# Patient Record
Sex: Male | Born: 1945 | Hispanic: No | Smoking: Never smoker
Health system: Southern US, Community
[De-identification: ages and names within clinical notes are randomized; demographics above are authoritative.]

## PROBLEM LIST (undated history)

## (undated) DIAGNOSIS — J9601 Acute respiratory failure with hypoxia: Secondary | ICD-10-CM

## (undated) DIAGNOSIS — R41841 Cognitive communication deficit: Secondary | ICD-10-CM

## (undated) DIAGNOSIS — M109 Gout, unspecified: Secondary | ICD-10-CM

## (undated) DIAGNOSIS — G9341 Metabolic encephalopathy: Secondary | ICD-10-CM

## (undated) DIAGNOSIS — I69398 Other sequelae of cerebral infarction: Secondary | ICD-10-CM

## (undated) DIAGNOSIS — K5900 Constipation, unspecified: Secondary | ICD-10-CM

## (undated) DIAGNOSIS — K219 Gastro-esophageal reflux disease without esophagitis: Secondary | ICD-10-CM

## (undated) DIAGNOSIS — R1310 Dysphagia, unspecified: Secondary | ICD-10-CM

## (undated) DIAGNOSIS — I10 Essential (primary) hypertension: Secondary | ICD-10-CM

## (undated) DIAGNOSIS — M6281 Muscle weakness (generalized): Secondary | ICD-10-CM

## (undated) DIAGNOSIS — Z8616 Personal history of COVID-19: Secondary | ICD-10-CM

## (undated) HISTORY — DX: Constipation, unspecified: K59.00

## (undated) HISTORY — DX: Dysphagia, unspecified: R13.10

## (undated) HISTORY — DX: Personal history of COVID-19: Z86.16

## (undated) HISTORY — DX: Muscle weakness (generalized): M62.81

## (undated) HISTORY — DX: Cognitive communication deficit: R41.841

## (undated) HISTORY — DX: Other sequelae of cerebral infarction: I69.398

## (undated) HISTORY — DX: Acute respiratory failure with hypoxia: J96.01

## (undated) HISTORY — DX: Metabolic encephalopathy: G93.41

---

## 1999-05-19 ENCOUNTER — Inpatient Hospital Stay (HOSPITAL_COMMUNITY): Admission: EM | Admit: 1999-05-19 | Discharge: 1999-05-22 | Payer: Self-pay | Admitting: Emergency Medicine

## 1999-05-19 ENCOUNTER — Encounter: Payer: Self-pay | Admitting: Emergency Medicine

## 2000-01-17 ENCOUNTER — Emergency Department (HOSPITAL_COMMUNITY): Admission: EM | Admit: 2000-01-17 | Discharge: 2000-01-17 | Payer: Self-pay | Admitting: Emergency Medicine

## 2007-01-19 ENCOUNTER — Ambulatory Visit (HOSPITAL_COMMUNITY): Admission: RE | Admit: 2007-01-19 | Discharge: 2007-01-19 | Payer: Self-pay | Admitting: Orthopedic Surgery

## 2008-12-06 ENCOUNTER — Emergency Department (HOSPITAL_COMMUNITY): Admission: EM | Admit: 2008-12-06 | Discharge: 2008-12-06 | Payer: Self-pay | Admitting: Emergency Medicine

## 2012-03-17 DIAGNOSIS — I10 Essential (primary) hypertension: Secondary | ICD-10-CM | POA: Insufficient documentation

## 2013-01-15 ENCOUNTER — Emergency Department (HOSPITAL_COMMUNITY): Payer: Medicare Other

## 2013-01-15 ENCOUNTER — Encounter (HOSPITAL_COMMUNITY): Payer: Self-pay | Admitting: *Deleted

## 2013-01-15 ENCOUNTER — Emergency Department (HOSPITAL_COMMUNITY)
Admission: EM | Admit: 2013-01-15 | Discharge: 2013-01-15 | Disposition: A | Discharge status: Home / Self Care | Payer: Medicare Other | Attending: Emergency Medicine | Admitting: Emergency Medicine

## 2013-01-15 DIAGNOSIS — K922 Gastrointestinal hemorrhage, unspecified: Secondary | ICD-10-CM | POA: Insufficient documentation

## 2013-01-15 DIAGNOSIS — R0602 Shortness of breath: Secondary | ICD-10-CM | POA: Insufficient documentation

## 2013-01-15 DIAGNOSIS — Z8719 Personal history of other diseases of the digestive system: Secondary | ICD-10-CM | POA: Insufficient documentation

## 2013-01-15 DIAGNOSIS — I10 Essential (primary) hypertension: Secondary | ICD-10-CM | POA: Insufficient documentation

## 2013-01-15 DIAGNOSIS — R42 Dizziness and giddiness: Secondary | ICD-10-CM | POA: Insufficient documentation

## 2013-01-15 DIAGNOSIS — K921 Melena: Secondary | ICD-10-CM

## 2013-01-15 DIAGNOSIS — Z79899 Other long term (current) drug therapy: Secondary | ICD-10-CM | POA: Insufficient documentation

## 2013-01-15 HISTORY — DX: Essential (primary) hypertension: I10

## 2013-01-15 LAB — COMPREHENSIVE METABOLIC PANEL
ALT: 16 U/L (ref 0–53)
AST: 13 U/L (ref 0–37)
Albumin: 3.3 g/dL — ABNORMAL LOW (ref 3.5–5.2)
Alkaline Phosphatase: 67 U/L (ref 39–117)
BUN: 55 mg/dL — ABNORMAL HIGH (ref 6–23)
CO2: 20 mEq/L (ref 19–32)
Calcium: 9.5 mg/dL (ref 8.4–10.5)
Chloride: 108 mEq/L (ref 96–112)
Creatinine, Ser: 1.49 mg/dL — ABNORMAL HIGH (ref 0.50–1.35)
GFR calc Af Amer: 55 mL/min — ABNORMAL LOW (ref >90)
GFR calc non Af Amer: 48 mL/min — ABNORMAL LOW (ref >90)
Glucose, Bld: 136 mg/dL — ABNORMAL HIGH (ref 70–99)
Potassium: 3.8 mEq/L (ref 3.5–5.1)
Sodium: 142 mEq/L (ref 135–145)
Total Bilirubin: 0.3 mg/dL (ref 0.3–1.2)
Total Protein: 6.8 g/dL (ref 6.0–8.3)

## 2013-01-15 LAB — CBC WITH DIFFERENTIAL/PLATELET
Basophils Absolute: 0.1 10*3/uL (ref 0.0–0.1)
Basophils Relative: 1 % (ref 0–1)
Eosinophils Absolute: 0.1 10*3/uL (ref 0.0–0.7)
Eosinophils Relative: 0 % (ref 0–5)
HCT: 37.4 % — ABNORMAL LOW (ref 39.0–52.0)
Hemoglobin: 13.5 g/dL (ref 13.0–17.0)
Lymphocytes Relative: 21 % (ref 12–46)
Lymphs Abs: 2.9 10*3/uL (ref 0.7–4.0)
MCH: 32.9 pg (ref 26.0–34.0)
MCHC: 36.1 g/dL — ABNORMAL HIGH (ref 30.0–36.0)
MCV: 91.2 fL (ref 78.0–100.0)
Monocytes Absolute: 0.8 10*3/uL (ref 0.1–1.0)
Monocytes Relative: 6 % (ref 3–12)
Neutro Abs: 10.1 10*3/uL — ABNORMAL HIGH (ref 1.7–7.7)
Neutrophils Relative %: 72 % (ref 43–77)
Platelets: 273 10*3/uL (ref 150–400)
RBC: 4.1 MIL/uL — ABNORMAL LOW (ref 4.22–5.81)
RDW: 14.3 % (ref 11.5–15.5)
WBC: 13.9 10*3/uL — ABNORMAL HIGH (ref 4.0–10.5)

## 2013-01-15 LAB — TROPONIN I: Troponin I: <0.3 ng/mL (ref <0.30)

## 2013-01-15 LAB — OCCULT BLOOD, POC DEVICE: Fecal Occult Bld: POSITIVE — AB

## 2013-01-15 MED ORDER — FAMOTIDINE 20 MG PO TABS: 20.0000 mg | ORAL_TABLET | Freq: Two times a day (BID) | ORAL | Status: DC

## 2013-01-15 MED ORDER — PANTOPRAZOLE SODIUM 20 MG PO TBEC: 40.0000 mg | DELAYED_RELEASE_TABLET | Freq: Every day | ORAL | Status: DC

## 2013-01-15 MED ORDER — SODIUM CHLORIDE 0.9 % IV BOLUS (SEPSIS)
1000.0000 mL | Freq: Once | INTRAVENOUS | Status: AC
  Administered 2013-01-15: 1000 mL via INTRAVENOUS

## 2013-01-15 MED ORDER — FAMOTIDINE 20 MG PO TABS
20.0000 mg | ORAL_TABLET | Freq: Once | ORAL | Status: AC
  Administered 2013-01-15: 20 mg via ORAL
  Filled 2013-01-15: qty 1

## 2013-01-15 MED ORDER — PANTOPRAZOLE SODIUM 40 MG PO TBEC
40.0000 mg | DELAYED_RELEASE_TABLET | Freq: Once | ORAL | Status: AC
  Administered 2013-01-15: 40 mg via ORAL
  Filled 2013-01-15: qty 1

## 2013-01-15 MED ORDER — SODIUM CHLORIDE 0.9 % IV BOLUS (SEPSIS): 1000.0000 mL | Freq: Once | INTRAVENOUS | Status: DC

## 2013-01-15 NOTE — ED Notes (Signed)
Pt disoriented at time of discharge. Stating "I don't know how I'm gonna get home. I've been falling a lot." Pt with disheveled appearance. Dr. Silverio Lay aware. New orders received

## 2013-01-15 NOTE — ED Notes (Signed)
Patient refused his ekg notified Dr.Wao of the refusal

## 2013-01-15 NOTE — ED Notes (Signed)
Patient refusing IV and IVF at this time, MD notified

## 2013-01-15 NOTE — ED Provider Notes (Addendum)
History     CSN: [id number]  Arrival date & time 01/15/13  0721   First MD Initiated Contact with Patient 01/15/13 [phone]      Chief Complaint  Patient presents with  . Melena  . Shortness of Breath    (Consider location/radiation/quality/duration/timing/severity/associated sxs/prior treatment) The history is provided by the patient.  Vincent Cummings is a 67 y.o. male hx of GI bleed, HTN here presenting with melena. He ate some spice from [place] 3 days ago and subsequently had black stools. He said that the spice appeared black. He denies abdominal pain, nausea, vomiting, or diarrhea. Does have hx of GI bleed in the past but he is unclear what the source was. No fevers or chills. Felt lightheaded but didn't pass out. No chest pain but he has mild SOB not worse with exertion. No hx of PE/DVTs.    Past Medical History  Diagnosis Date  . Hypertension     History reviewed. No pertinent past surgical history.  No family history on file.  History  Substance Use Topics  . Smoking status: Never Smoker   . Smokeless tobacco: Not on file  . Alcohol Use: No      Review of Systems  Respiratory: Positive for shortness of breath.   Gastrointestinal:       Melena   All other systems reviewed and are negative.    Allergies  Review of patient's allergies indicates no known allergies.  Home Medications   Current Outpatient Rx  Name  Route  Sig  Dispense  Refill  . amLODipine-benazepril (LOTREL) 5-40 MG per capsule   Oral   Take 1 capsule by mouth daily.         Marland Kitchen aspirin 325 MG tablet   Oral   Take 325 mg by mouth daily as needed for pain.         . famotidine (PEPCID) 20 MG tablet   Oral   Take 1 tablet (20 mg total) by mouth 2 (two) times daily.   10 tablet   0   . pantoprazole (PROTONIX) 20 MG tablet   Oral   Take 2 tablets (40 mg total) by mouth daily.   30 tablet   0     BP 139/78  Pulse 87  Temp(Src) 97.4 F (36.3 C) (Oral)  Resp 22  SpO2  100%  Physical Exam  Nursing note and vitals reviewed. Constitutional: He is oriented to person, place, and time. He appears well-developed and well-nourished.  HENT:  Head: Normocephalic.  Mouth/Throat: Oropharynx is clear and moist.  Eyes: Conjunctivae are normal. Pupils are equal, round, and reactive to light.  Conjunctiva not pale   Neck: Normal range of motion. Neck supple.  Cardiovascular: Normal rate, regular rhythm and normal heart sounds.   Pulmonary/Chest: Effort normal and breath sounds normal. No respiratory distress. He has no wheezes. He has no rales.  Abdominal: Soft. Bowel sounds are normal. He exhibits no distension. There is no tenderness. There is no rebound and no guarding.  Rectal- melena, no hemorrhoids.   Musculoskeletal: Normal range of motion.  Neurological: He is alert and oriented to person, place, and time.  Skin: Skin is warm and dry.  Psychiatric: He has a normal mood and affect. His behavior is normal. Judgment and thought content normal.    ED Course  Procedures (including critical care time)  Labs Reviewed  CBC WITH DIFFERENTIAL - Abnormal; Notable for the following:    WBC 13.9 (*)    RBC  4.10 (*)    HCT 37.4 (*)    MCHC 36.1 (*)    Neutro Abs 10.1 (*)    All other components within normal limits  COMPREHENSIVE METABOLIC PANEL - Abnormal; Notable for the following:    Glucose, Bld 136 (*)    BUN 55 (*)    Creatinine, Ser 1.49 (*)    Albumin 3.3 (*)    GFR calc non Af Amer 48 (*)    GFR calc Af Amer 55 (*)    All other components within normal limits  OCCULT BLOOD, POC DEVICE - Abnormal; Notable for the following:    Fecal Occult Bld POSITIVE (*)    All other components within normal limits  TROPONIN I   No results found.   1. GI bleed   2. Melena      Date: 01/15/2013  Rate: 94  Rhythm: normal sinus rhythm  QRS Axis: normal  Intervals: normal  ST/T Wave abnormalities: nonspecific ST changes  Conduction Disutrbances:none   Narrative Interpretation:   Old EKG Reviewed: none available     MDM  Vincent Cummings is a 66 y.o. male here with melena, SOB. Need to r/o GI bleed so will check CBC and heme occ. Unclear why he is SOB. Appeared comfortable in the room, not hypoxic or tachycardic. PERC rule neg. Low risk for ACS so trop x 1 sufficient. Will reassess.    8:50 AM Patient's occ positive, black stool. Patient well appearing. Refused EKG, CXR and IVF. Hg 13, BUN, Cr elevated but no baseline. I think he likely has slow upper GI bleed from gastric ulcer. I told him to not eat spicy food and to take pepcid, protonix. He should avoid NSAIDs, aspirin.    9:30 AM Nurse mentioned that he has fallen and is tachycardic on attempt to discharge. Now is agreeable to IVF and EKG.   11:10 AM Tolerated PO now, not tachycardic after fluids. Felt better will d/c home.    Richardean Canal, MD 01/15/13 1478  Richardean Canal, MD 01/15/13 [phone]

## 2013-01-15 NOTE — ED Notes (Signed)
Pt about to be discharged to home and

## 2013-01-15 NOTE — ED Notes (Signed)
Patient states he was cooking with foreign spice and since that time he has been developing black stools, denies n/v/ abdominal pain, patient states he has some gi bleeding years ago but not in recent,

## 2013-11-20 ENCOUNTER — Emergency Department (HOSPITAL_BASED_OUTPATIENT_CLINIC_OR_DEPARTMENT_OTHER)
Admission: EM | Admit: 2013-11-20 | Discharge: 2013-11-20 | Disposition: A | Discharge status: Home / Self Care | Payer: Worker's Compensation | Attending: Emergency Medicine | Admitting: Emergency Medicine

## 2013-11-20 ENCOUNTER — Encounter (HOSPITAL_BASED_OUTPATIENT_CLINIC_OR_DEPARTMENT_OTHER): Payer: Self-pay | Admitting: Emergency Medicine

## 2013-11-20 ENCOUNTER — Emergency Department (HOSPITAL_BASED_OUTPATIENT_CLINIC_OR_DEPARTMENT_OTHER): Payer: Worker's Compensation

## 2013-11-20 DIAGNOSIS — I10 Essential (primary) hypertension: Secondary | ICD-10-CM | POA: Insufficient documentation

## 2013-11-20 DIAGNOSIS — Y9389 Activity, other specified: Secondary | ICD-10-CM | POA: Diagnosis not present

## 2013-11-20 DIAGNOSIS — Z7982 Long term (current) use of aspirin: Secondary | ICD-10-CM | POA: Insufficient documentation

## 2013-11-20 DIAGNOSIS — S8990XA Unspecified injury of unspecified lower leg, initial encounter: Secondary | ICD-10-CM | POA: Diagnosis present

## 2013-11-20 DIAGNOSIS — X500XXA Overexertion from strenuous movement or load, initial encounter: Secondary | ICD-10-CM | POA: Diagnosis not present

## 2013-11-20 DIAGNOSIS — S99919A Unspecified injury of unspecified ankle, initial encounter: Secondary | ICD-10-CM | POA: Diagnosis present

## 2013-11-20 DIAGNOSIS — Y9289 Other specified places as the place of occurrence of the external cause: Secondary | ICD-10-CM | POA: Insufficient documentation

## 2013-11-20 DIAGNOSIS — Y990 Civilian activity done for income or pay: Secondary | ICD-10-CM | POA: Insufficient documentation

## 2013-11-20 DIAGNOSIS — IMO0002 Reserved for concepts with insufficient information to code with codable children: Secondary | ICD-10-CM | POA: Diagnosis not present

## 2013-11-20 DIAGNOSIS — S8392XA Sprain of unspecified site of left knee, initial encounter: Secondary | ICD-10-CM

## 2013-11-20 DIAGNOSIS — Z79899 Other long term (current) drug therapy: Secondary | ICD-10-CM | POA: Diagnosis not present

## 2013-11-20 NOTE — ED Notes (Signed)
Pt atwork and twisted left knee. Workers comp initiated. Pt has already had drug screen per patient.

## 2013-11-20 NOTE — Discharge Instructions (Signed)
Joint Sprain °A sprain is a tear or stretch in the ligaments that hold a joint together. Severe sprains may need as long as 3-6 weeks of immobilization and/or exercises to heal completely. Sprained joints should be rested and protected. If not, they can become unstable and prone to re-injury. Proper treatment can reduce your pain, shorten the period of disability, and reduce the risk of repeated injuries. °TREATMENT  °· Rest and elevate the injured joint to reduce pain and swelling. °· Apply ice packs to the injury for 20-30 minutes every 2-3 hours for the next 2-3 days. °· Keep the injury wrapped in a compression bandage or splint as long as the joint is painful or as instructed by your caregiver. °· Do not use the injured joint until it is completely healed to prevent re-injury and chronic instability. Follow the instructions of your caregiver. °· Long-term sprain management may require exercises and/or treatment by a physical therapist. Taping or special braces may help stabilize the joint until it is completely better. °SEEK MEDICAL CARE IF:  °· You develop increased pain or swelling of the joint. °· You develop increasing redness and warmth of the joint. °· You develop a fever. °· It becomes stiff. °· Your hand or foot gets cold or numb. °Document Released: 12/09/2004 Document Revised: 01/24/2012 Document Reviewed: 11/18/2008 °[employer]® Patient Information ©2014 [employer]. ° °

## 2013-11-26 NOTE — ED Provider Notes (Signed)
CSN: [id number]     Arrival date & time 11/20/13  [id number] History   First MD Initiated Contact with Patient 11/20/13 1230     Chief Complaint  Patient presents with  . Knee Pain    HPI Pt atwork and twisted left knee. Workers comp initiated. Pt has already had drug screen per patient  Past Medical History  Diagnosis Date  . Hypertension    History reviewed. No pertinent past surgical history. History reviewed. No pertinent family history. History  Substance Use Topics  . Smoking status: Never Smoker   . Smokeless tobacco: Not on file  . Alcohol Use: No    Review of Systems  All other systems reviewed and are negative.    Allergies  Review of patient's allergies indicates no known allergies.  Home Medications   Current Outpatient Rx  Name  Route  Sig  Dispense  Refill  . amLODipine-benazepril (LOTREL) 5-40 MG per capsule   Oral   Take 1 capsule by mouth daily.         Marland Kitchen. aspirin 325 MG tablet   Oral   Take 325 mg by mouth daily as needed for pain.         . famotidine (PEPCID) 20 MG tablet   Oral   Take 1 tablet (20 mg total) by mouth 2 (two) times daily.   10 tablet   0   . pantoprazole (PROTONIX) 20 MG tablet   Oral   Take 2 tablets (40 mg total) by mouth daily.   30 tablet   0    Temp(Src) 98.7 F (37.1 C) (Oral) Physical Exam  Nursing note and vitals reviewed. Constitutional: He is oriented to person, place, and time. He appears well-developed and well-nourished. No distress.  HENT:  Head: Normocephalic and atraumatic.  Eyes: Pupils are equal, round, and reactive to light.  Neck: Normal range of motion.  Cardiovascular: Normal rate and intact distal pulses.   Pulmonary/Chest: No respiratory distress.  Abdominal: Normal appearance. He exhibits no distension.  Musculoskeletal:       Left knee: He exhibits decreased range of motion, swelling and abnormal patellar mobility. He exhibits no effusion, no deformity, no erythema and no LCL laxity.   Neurological: He is alert and oriented to person, place, and time. No cranial nerve deficit.  Skin: Skin is warm and dry. No rash noted.  Psychiatric: He has a normal mood and affect. His behavior is normal.    ED Course  Procedures (including critical care time) Labs Review  Imaging Review  Labs Reviewed - No data to display  Dg Knee Complete 4 Views Left  .  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 Small joint effusion.    Electronically Signed   By: Augusto GambleLee  Hall M.D.   On: 11/20/2013 10:30    EKG Interpretation   None       MDM   1. Knee sprain, left, initial encounter        Nelia Shiobert L Davi Rotan, MD 11/26/13 1536

## 2013-12-11 ENCOUNTER — Encounter: Payer: Self-pay | Admitting: Family Medicine

## 2013-12-11 ENCOUNTER — Ambulatory Visit (INDEPENDENT_AMBULATORY_CARE_PROVIDER_SITE_OTHER): Payer: Worker's Compensation | Admitting: Family Medicine

## 2013-12-11 VITALS — BP 173/102 | HR 90 | Ht 67.0 in | Wt 190.0 lb

## 2013-12-11 DIAGNOSIS — S99929A Unspecified injury of unspecified foot, initial encounter: Secondary | ICD-10-CM

## 2013-12-11 DIAGNOSIS — S8992XA Unspecified injury of left lower leg, initial encounter: Secondary | ICD-10-CM

## 2013-12-11 DIAGNOSIS — S99919A Unspecified injury of unspecified ankle, initial encounter: Secondary | ICD-10-CM

## 2013-12-11 DIAGNOSIS — S8990XA Unspecified injury of unspecified lower leg, initial encounter: Secondary | ICD-10-CM

## 2013-12-11 NOTE — Patient Instructions (Addendum)
Your injury and pain are due to a combination of knee contusion, sprain, and flaring of your underlying arthritis. Continue with the medicine from Dr. Althea CharonMcKinley - if you need more of this let me know. Icing 15 minutes at a time 3-4 times a day. Elevation above the level of your heart if needed for swelling. Knee sleeve for compression when up and walking around. If not improving at follow-up would consider a cortisone injection. Follow up with me in 4 weeks.

## 2013-12-16 ENCOUNTER — Encounter: Payer: Self-pay | Admitting: Family Medicine

## 2013-12-16 DIAGNOSIS — M25562 Pain in left knee: Secondary | ICD-10-CM | POA: Insufficient documentation

## 2013-12-16 NOTE — Assessment & Plan Note (Signed)
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 Negative meniscal and ligamentous testing currently. Most consistent with contusion, sprain (this part resolved), and flare of underlying arthritis. Continue meloxicam. Icing, elevation, knee sleeve. Consider injection if not improving. F/u in 4 weeks. Placed on light duty (see letter).

## 2013-12-16 NOTE — Progress Notes (Signed)
Patient ID: Vincent Cummings, male   DOB: August 09, 1946, 68 y.o.   MRN: [id number]  PCP: Dois DavenportICHTER,KAREN L., MD  Subjective:   HPI: Patient is a 68 y.o. male here for left knee pain.  Patient reports on 1/5 he was coming down a ladder at work when he missed the last 3 rungs. Caused him to twist his left knee. Some swelling initially. Taking meloxicam from a different injury in past. Swelling has resolved. No catching, locking, giving out. No prior issues with this knee and no surgeries here. Pain 0/10 at rest, 5/10 with climbing, stairs, walking.  Past Medical History  Diagnosis Date  . Hypertension     Current Outpatient Prescriptions on File Prior to Visit  Medication Sig Dispense Refill  . amLODipine-benazepril (LOTREL) 5-40 MG per capsule Take 1 capsule by mouth daily.      Marland Kitchen. aspirin 325 MG tablet Take 325 mg by mouth daily as needed for pain.      . famotidine (PEPCID) 20 MG tablet Take 1 tablet (20 mg total) by mouth 2 (two) times daily.  10 tablet  0  . pantoprazole (PROTONIX) 20 MG tablet Take 2 tablets (40 mg total) by mouth daily.  30 tablet  0   No current facility-administered medications on file prior to visit.    History reviewed. No pertinent past surgical history.  No Known Allergies  History   Social History  . Marital Status: Married    Spouse Name: N/A    Number of Children: N/A  . Years of Education: N/A   Occupational History  . Not on file.   Social History Main Topics  . Smoking status: Never Smoker   . Smokeless tobacco: Not on file  . Alcohol Use: No  . Drug Use: Not on file  . Sexual Activity: Not on file   Other Topics Concern  . Not on file   Social History Narrative  . No narrative on file    History reviewed. No pertinent family history.  BP 173/102  Pulse 90  Ht 5\' 7"  (1.702 m)  Wt 190 lb (86.183 kg)  BMI 29.75 kg/m2  Review of Systems: See HPI above.    Objective:  Physical Exam:  Gen: NAD  Left knee: No gross  deformity, ecchymoses, effusion. Mild TTP medial, lateral joint lines.  No other tenderness. FROM. Negative ant/post drawers. Negative valgus/varus testing. Negative lachmanns. Negative mcmurrays, apleys, patellar apprehension. NV intact distally.    Assessment & Plan:  1. 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  Negative meniscal and ligamentous testing currently.  Most consistent with contusion, sprain (this part resolved), and flare of underlying arthritis.  Continue meloxicam.   Icing, elevation, knee sleeve.  Consider injection if not improving.  F/u in 4 weeks.  Placed on light duty (see letter).

## 2014-02-21 ENCOUNTER — Ambulatory Visit (INDEPENDENT_AMBULATORY_CARE_PROVIDER_SITE_OTHER): Payer: Worker's Compensation | Admitting: Family Medicine

## 2014-02-21 VITALS — BP 170/112 | HR 91 | Ht 66.0 in | Wt 190.0 lb

## 2014-02-21 DIAGNOSIS — S99929A Unspecified injury of unspecified foot, initial encounter: Secondary | ICD-10-CM

## 2014-02-21 DIAGNOSIS — S8990XA Unspecified injury of unspecified lower leg, initial encounter: Secondary | ICD-10-CM

## 2014-02-21 DIAGNOSIS — S99919A Unspecified injury of unspecified ankle, initial encounter: Secondary | ICD-10-CM

## 2014-02-21 DIAGNOSIS — S8992XA Unspecified injury of left lower leg, initial encounter: Secondary | ICD-10-CM

## 2014-02-25 ENCOUNTER — Encounter: Payer: Self-pay | Admitting: Family Medicine

## 2014-02-25 NOTE — Progress Notes (Signed)
Patient ID: Vincent Cummings, male   DOB: 1946-09-17, 68 y.o.   MRN: [id number]  PCP: Dois DavenportICHTER,KAREN L., MD  Subjective:   HPI: Patient is a 68 y.o. male here for left knee pain.  1/27: Patient reports on 1/5 he was coming down a ladder at work when he missed the last 3 rungs. Caused him to twist his left knee. Some swelling initially. Taking meloxicam from a different injury in past. Swelling has resolved. No catching, locking, giving out. No prior issues with this knee and no surgeries here. Pain 0/10 at rest, 5/10 with climbing, stairs, walking.  4/9: Patient reports he has improved since last visit. Pain up to 4/10 at worst, no pain at rest. Difficulty climbing stairs and up and down ladder. Unable to fully flex because this hurts. No catching, locking, giving out.  Past Medical History  Diagnosis Date  . Hypertension     Current Outpatient Prescriptions on File Prior to Visit  Medication Sig Dispense Refill  . amLODipine-benazepril (LOTREL) 5-40 MG per capsule Take 1 capsule by mouth daily.      Marland Kitchen. aspirin 325 MG tablet Take 325 mg by mouth daily as needed for pain.      . famotidine (PEPCID) 20 MG tablet Take 1 tablet (20 mg total) by mouth 2 (two) times daily.  10 tablet  0  . pantoprazole (PROTONIX) 20 MG tablet Take 2 tablets (40 mg total) by mouth daily.  30 tablet  0   No current facility-administered medications on file prior to visit.    History reviewed. No pertinent past surgical history.  No Known Allergies  History   Social History  . Marital Status: Married    Spouse Name: N/A    Number of Children: N/A  . Years of Education: N/A   Occupational History  . Not on file.   Social History Main Topics  . Smoking status: Never Smoker   . Smokeless tobacco: Not on file  . Alcohol Use: No  . Drug Use: Not on file  . Sexual Activity: Not on file   Other Topics Concern  . Not on file   Social History Narrative  . No narrative on file     History reviewed. No pertinent family history.  BP 170/112  Pulse 91  Ht 5\' 6"  (1.676 m)  Wt 190 lb (86.183 kg)  BMI 30.68 kg/m2  Review of Systems: See HPI above.    Objective:  Physical Exam:  Gen: NAD  Left knee: No gross deformity, ecchymoses, effusion. Mild TTP medial, lateral joint lines.  No other tenderness. FROM. Negative ant/post drawers. Negative valgus/varus testing. Negative lachmanns. Negative mcmurrays, apleys, patellar apprehension. NV intact distally.    Assessment & Plan:  1. 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  Again Negative meniscal and ligamentous testing.  Suspect at this point dealing with a flare of underlying arthritis.  Declined an intraarticular injection today and instead he wants to try physical therapy.  We will set this up.  Meloxicam as needed.  Icing, elevation, knee sleeve.  Consider injection if not improving.  F/u in 4-6 weeks.

## 2014-02-25 NOTE — Assessment & Plan Note (Signed)
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  Again Negative meniscal and ligamentous testing.  Suspect at this point dealing with a flare of underlying arthritis.  Declined an intraarticular injection today and instead he wants to try physical therapy.  We will set this up.  Meloxicam as needed.  Icing, elevation, knee sleeve.  Consider injection if not improving.  F/u in 4-6 weeks.

## 2014-02-28 ENCOUNTER — Other Ambulatory Visit: Payer: Self-pay | Admitting: *Deleted

## 2014-02-28 DIAGNOSIS — S8992XA Unspecified injury of left lower leg, initial encounter: Secondary | ICD-10-CM

## 2014-03-28 ENCOUNTER — Ambulatory Visit: Payer: Self-pay | Admitting: Family Medicine

## 2014-04-02 ENCOUNTER — Encounter (INDEPENDENT_AMBULATORY_CARE_PROVIDER_SITE_OTHER): Payer: Self-pay

## 2014-04-02 ENCOUNTER — Encounter: Payer: Self-pay | Admitting: Family Medicine

## 2014-04-02 ENCOUNTER — Ambulatory Visit (INDEPENDENT_AMBULATORY_CARE_PROVIDER_SITE_OTHER): Payer: Worker's Compensation | Admitting: Family Medicine

## 2014-04-02 VITALS — BP 175/103 | HR 80 | Ht 65.0 in | Wt 190.0 lb

## 2014-04-02 DIAGNOSIS — S8992XA Unspecified injury of left lower leg, initial encounter: Secondary | ICD-10-CM

## 2014-04-02 DIAGNOSIS — S99929A Unspecified injury of unspecified foot, initial encounter: Secondary | ICD-10-CM

## 2014-04-02 DIAGNOSIS — S8990XA Unspecified injury of unspecified lower leg, initial encounter: Secondary | ICD-10-CM

## 2014-04-02 DIAGNOSIS — S99919A Unspecified injury of unspecified ankle, initial encounter: Secondary | ICD-10-CM

## 2014-04-03 ENCOUNTER — Encounter: Payer: Self-pay | Admitting: Family Medicine

## 2014-04-03 NOTE — Progress Notes (Signed)
Patient ID: Vincent GriceBenigno S Kram, male   DOB: 08/11/1946, 68 y.o.   MRN: [id number]  PCP: Dois DavenportICHTER,KAREN L., MD  Subjective:   HPI: Patient is a 68 y.o. male here for left knee pain.  1/27: Patient reports on 1/5 he was coming down a ladder at work when he missed the last 3 rungs. Caused him to twist his left knee. Some swelling initially. Taking meloxicam from a different injury in past. Swelling has resolved. No catching, locking, giving out. No prior issues with this knee and no surgeries here. Pain 0/10 at rest, 5/10 with climbing, stairs, walking.  4/9: Patient reports he has improved since last visit. Pain up to 4/10 at worst, no pain at rest. Difficulty climbing stairs and up and down ladder. Unable to fully flex because this hurts. No catching, locking, giving out.  5/19: Patient reports he is doing well since last visit. Doing physical therapy and home exercises. Difficulty with squatting, running, some when climbing. Happy with his progress. Takes meloxicam as needed.  Past Medical History  Diagnosis Date  . Hypertension     Current Outpatient Prescriptions on File Prior to Visit  Medication Sig Dispense Refill  . amLODipine-benazepril (LOTREL) 5-40 MG per capsule Take 1 capsule by mouth daily.      Marland Kitchen. aspirin 325 MG tablet Take 325 mg by mouth daily as needed for pain.      . famotidine (PEPCID) 20 MG tablet Take 1 tablet (20 mg total) by mouth 2 (two) times daily.  10 tablet  0  . pantoprazole (PROTONIX) 20 MG tablet Take 2 tablets (40 mg total) by mouth daily.  30 tablet  0   No current facility-administered medications on file prior to visit.    History reviewed. No pertinent past surgical history.  No Known Allergies  History   Social History  . Marital Status: Married    Spouse Name: N/A    Number of Children: N/A  . Years of Education: N/A   Occupational History  . Not on file.   Social History Main Topics  . Smoking status: Never Smoker   .  Smokeless tobacco: Not on file  . Alcohol Use: No  . Drug Use: Not on file  . Sexual Activity: Not on file   Other Topics Concern  . Not on file   Social History Narrative  . No narrative on file    History reviewed. No pertinent family history.  BP 175/103  Pulse 80  Ht 5\' 5"  (1.651 m)  Wt 190 lb (86.183 kg)  BMI 31.62 kg/m2  Review of Systems: See HPI above.    Objective:  Physical Exam:  Gen: NAD  Left knee: No gross deformity, ecchymoses, effusion. Minimal TTP medial, lateral joint lines.  No other tenderness. FROM. Negative ant/post drawers. Negative valgus/varus testing. Negative lachmanns. Negative mcmurrays, apleys, patellar apprehension. NV intact distally.    Assessment & Plan:  1. 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  Again Negative meniscal and ligamentous testing.  2/2 flare of underlying DJD.  Continue PT and transition to HEP.  F/u in 6 weeks. Meloxicam as needed.  Icing, elevation, knee sleeve.

## 2014-04-03 NOTE — Assessment & Plan Note (Signed)
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  Again Negative meniscal and ligamentous testing.  2/2 flare of underlying DJD.  Continue PT and transition to HEP.  F/u in 6 weeks. Meloxicam as needed.  Icing, elevation, knee sleeve.

## 2014-05-14 ENCOUNTER — Encounter: Payer: Self-pay | Admitting: Family Medicine

## 2014-05-14 ENCOUNTER — Ambulatory Visit (INDEPENDENT_AMBULATORY_CARE_PROVIDER_SITE_OTHER): Payer: Worker's Compensation | Admitting: Family Medicine

## 2014-05-14 VITALS — BP 165/108 | HR 83 | Ht 66.0 in | Wt 190.0 lb

## 2014-05-14 DIAGNOSIS — S8990XA Unspecified injury of unspecified lower leg, initial encounter: Secondary | ICD-10-CM

## 2014-05-14 DIAGNOSIS — Z5189 Encounter for other specified aftercare: Secondary | ICD-10-CM | POA: Diagnosis not present

## 2014-05-14 DIAGNOSIS — S99919A Unspecified injury of unspecified ankle, initial encounter: Secondary | ICD-10-CM

## 2014-05-14 DIAGNOSIS — M1712 Unilateral primary osteoarthritis, left knee: Secondary | ICD-10-CM

## 2014-05-14 DIAGNOSIS — M1710 Unilateral primary osteoarthritis, unspecified knee: Secondary | ICD-10-CM

## 2014-05-14 DIAGNOSIS — S99929A Unspecified injury of unspecified foot, initial encounter: Secondary | ICD-10-CM

## 2014-05-14 DIAGNOSIS — S8992XD Unspecified injury of left lower leg, subsequent encounter: Secondary | ICD-10-CM

## 2014-05-14 DIAGNOSIS — IMO0002 Reserved for concepts with insufficient information to code with codable children: Secondary | ICD-10-CM

## 2014-05-14 MED ORDER — MELOXICAM 15 MG PO TABS: 15.0000 mg | ORAL_TABLET | Freq: Every day | ORAL | Status: DC

## 2014-05-14 MED ORDER — METHYLPREDNISOLONE ACETATE 40 MG/ML IJ SUSP
40.0000 mg | Freq: Once | INTRAMUSCULAR | Status: AC
  Administered 2014-05-14: 40 mg via INTRA_ARTICULAR

## 2014-05-14 NOTE — Patient Instructions (Signed)
You were given a cortisone injection today. It may take a few days for this to kick in. No restrictions on activities. Try taking tylenol 500mg  1-2 tabs three times a day as your first line medicine. Then take meloxicam only as needed in addition to this. Follow up with me in 6 weeks.

## 2014-05-15 ENCOUNTER — Encounter: Payer: Self-pay | Admitting: Family Medicine

## 2014-05-15 NOTE — Assessment & Plan Note (Signed)
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  Again Negative meniscal and ligamentous testing.  2/2 flare of underlying DJD.  Continue HEP, meloxicam.  Injection given today.  Icing, elevation, knee sleeve.  F/u in 6 weeks.  After informed written consent, patient was seated on exam table. Left knee was prepped with alcohol swab and utilizing anteromedial approach, patient's left knee was injected intraarticularly with 3:1 marcaine: depomedrol. Patient tolerated the procedure well without immediate complications.

## 2014-05-15 NOTE — Progress Notes (Signed)
Patient ID: Vincent Cummings, male   DOB: Apr 28, 1946, 68 y.o.   MRN: [id number]  PCP: Dois DavenportICHTER,KAREN L., MD  Subjective:   HPI: Patient is a 68 y.o. male here for left knee pain.  1/27: Patient reports on 1/5 he was coming down a ladder at work when he missed the last 3 rungs. Caused him to twist his left knee. Some swelling initially. Taking meloxicam from a different injury in past. Swelling has resolved. No catching, locking, giving out. No prior issues with this knee and no surgeries here. Pain 0/10 at rest, 5/10 with climbing, stairs, walking.  4/9: Patient reports he has improved since last visit. Pain up to 4/10 at worst, no pain at rest. Difficulty climbing stairs and up and down ladder. Unable to fully flex because this hurts. No catching, locking, giving out.  5/19: Patient reports he is doing well since last visit. Doing physical therapy and home exercises. Difficulty with squatting, running, some when climbing. Happy with his progress. Takes meloxicam as needed.  6/30: Patient reports overall he's doing well. Still has problems by end of day, with squatting and climbing. Taking meloxicam - has trouble if he doesn't take this. No swelling. Would like to try injeciton at this point.  Past Medical History  Diagnosis Date  . Hypertension     Current Outpatient Prescriptions on File Prior to Visit  Medication Sig Dispense Refill  . amLODipine-benazepril (LOTREL) 5-40 MG per capsule Take 1 capsule by mouth daily.      Marland Kitchen. aspirin 325 MG tablet Take 325 mg by mouth daily as needed for pain.      . famotidine (PEPCID) 20 MG tablet Take 1 tablet (20 mg total) by mouth 2 (two) times daily.  10 tablet  0  . pantoprazole (PROTONIX) 20 MG tablet Take 2 tablets (40 mg total) by mouth daily.  30 tablet  0   No current facility-administered medications on file prior to visit.    History reviewed. No pertinent past surgical history.  No Known Allergies  History    Social History  . Marital Status: Married    Spouse Name: N/A    Number of Children: N/A  . Years of Education: N/A   Occupational History  . Not on file.   Social History Main Topics  . Smoking status: Never Smoker   . Smokeless tobacco: Not on file  . Alcohol Use: No  . Drug Use: Not on file  . Sexual Activity: Not on file   Other Topics Concern  . Not on file   Social History Narrative  . No narrative on file    History reviewed. No pertinent family history.  BP 165/108  Pulse 83  Ht 5\' 6"  (1.676 m)  Wt 190 lb (86.183 kg)  BMI 30.68 kg/m2  Review of Systems: See HPI above.    Objective:  Physical Exam:  Gen: NAD  Left knee: No gross deformity, ecchymoses, effusion. Minimal TTP medial, lateral joint lines.  No other tenderness. FROM. Negative ant/post drawers. Negative valgus/varus testing. Negative lachmanns. Negative mcmurrays, apleys, patellar apprehension. NV intact distally.    Assessment & Plan:  1. 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  Again Negative meniscal and ligamentous testing.  2/2 flare of underlying DJD.  Continue HEP, meloxicam.  Injection given today.  Icing, elevation, knee sleeve.  F/u in 6 weeks.  After informed written consent, patient was seated on exam table. Left  knee was prepped with alcohol swab and utilizing anteromedial approach, patient's left knee was injected intraarticularly with 3:1 marcaine: depomedrol. Patient tolerated the procedure well without immediate complications.

## 2014-06-07 ENCOUNTER — Encounter: Payer: Self-pay | Admitting: *Deleted

## 2014-06-25 ENCOUNTER — Encounter: Payer: Self-pay | Admitting: Family Medicine

## 2014-06-25 ENCOUNTER — Ambulatory Visit (INDEPENDENT_AMBULATORY_CARE_PROVIDER_SITE_OTHER): Payer: Worker's Compensation | Admitting: Family Medicine

## 2014-06-25 VITALS — BP 148/100 | HR 75 | Ht 66.0 in | Wt 190.0 lb

## 2014-06-25 DIAGNOSIS — S99929A Unspecified injury of unspecified foot, initial encounter: Secondary | ICD-10-CM

## 2014-06-25 DIAGNOSIS — S8992XD Unspecified injury of left lower leg, subsequent encounter: Secondary | ICD-10-CM

## 2014-06-25 DIAGNOSIS — S99919A Unspecified injury of unspecified ankle, initial encounter: Secondary | ICD-10-CM

## 2014-06-25 DIAGNOSIS — Z5189 Encounter for other specified aftercare: Secondary | ICD-10-CM | POA: Diagnosis not present

## 2014-06-25 DIAGNOSIS — S8990XA Unspecified injury of unspecified lower leg, initial encounter: Secondary | ICD-10-CM

## 2014-06-27 ENCOUNTER — Encounter: Payer: Self-pay | Admitting: Family Medicine

## 2014-06-27 NOTE — Assessment & Plan Note (Signed)
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  Again Negative meniscal and ligamentous testing.  2/2 flare of underlying DJD.  Continue HEP, meloxicam.  S/p injection with benefit.  Icing, elevation, knee sleeve.  F/u in 3 months.

## 2014-06-27 NOTE — Progress Notes (Signed)
Patient ID: Vincent Cummings, male   DOB: [birth year], 68 y.o.   MRN: [id number]  PCP: Dois DavenportICHTER,KAREN L., MD  Subjective:   HPI: Patient is a 68 y.o. male here for left knee pain.  1/27: Patient reports on 1/5 he was coming down a ladder at work when he missed the last 3 rungs. Caused him to twist his left knee. Some swelling initially. Taking meloxicam from a different injury in past. Swelling has resolved. No catching, locking, giving out. No prior issues with this knee and no surgeries here. Pain 0/10 at rest, 5/10 with climbing, stairs, walking.  4/9: Patient reports he has improved since last visit. Pain up to 4/10 at worst, no pain at rest. Difficulty climbing stairs and up and down ladder. Unable to fully flex because this hurts. No catching, locking, giving out.  5/19: Patient reports he is doing well since last visit. Doing physical therapy and home exercises. Difficulty with squatting, running, some when climbing. Happy with his progress. Takes meloxicam as needed.  6/30: Patient reports overall he's doing well. Still has problems by end of day, with squatting and climbing. Taking meloxicam - has trouble if he doesn't take this. No swelling. Would like to try injeciton at this point.  8/11: Patient doing well. Injection has helped. No swelling. Difficulties squatting, running but much better. Takes meloxicam as needed. Done with physical therapy. No catching, locking, giving out.  Past Medical History  Diagnosis Date  . Hypertension     Current Outpatient Prescriptions on File Prior to Visit  Medication Sig Dispense Refill  . amLODipine-benazepril (LOTREL) 5-40 MG per capsule Take 1 capsule by mouth daily.      Marland Kitchen. aspirin 325 MG tablet Take 325 mg by mouth daily as needed for pain.      . famotidine (PEPCID) 20 MG tablet Take 1 tablet (20 mg total) by mouth 2 (two) times daily.  10 tablet  0  . meloxicam (MOBIC) 15 MG tablet Take 1 tablet (15 mg total)  by mouth daily.  30 tablet  2  . pantoprazole (PROTONIX) 20 MG tablet Take 2 tablets (40 mg total) by mouth daily.  30 tablet  0   No current facility-administered medications on file prior to visit.    History reviewed. No pertinent past surgical history.  No Known Allergies  History   Social History  . Marital Status: Married    Spouse Name: N/A    Number of Children: N/A  . Years of Education: N/A   Occupational History  . Not on file.   Social History Main Topics  . Smoking status: Never Smoker   . Smokeless tobacco: Not on file  . Alcohol Use: No  . Drug Use: Not on file  . Sexual Activity: Not on file   Other Topics Concern  . Not on file   Social History Narrative  . No narrative on file    History reviewed. No pertinent family history.  BP 148/100  Pulse 75  Ht 5\' 6"  (1.676 m)  Wt 190 lb (86.183 kg)  BMI 30.68 kg/m2  Review of Systems: See HPI above.    Objective:  Physical Exam:  Gen: NAD  Left knee: No gross deformity, ecchymoses, effusion. Minimal TTP medial, lateral joint lines.  No other tenderness. FROM. Negative ant/post drawers. Negative valgus/varus testing. Negative lachmanns. Negative mcmurrays, apleys, patellar apprehension. NV intact distally.    Assessment & Plan:  1. 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  Again Negative meniscal and ligamentous testing.  2/2 flare of underlying DJD.  Continue HEP, meloxicam.  S/p injection with benefit.  Icing, elevation, knee sleeve.  F/u in 3 months.

## 2014-07-30 DIAGNOSIS — M1710 Unilateral primary osteoarthritis, unspecified knee: Secondary | ICD-10-CM | POA: Insufficient documentation

## 2014-07-30 DIAGNOSIS — R42 Dizziness and giddiness: Secondary | ICD-10-CM | POA: Insufficient documentation

## 2014-07-30 DIAGNOSIS — M179 Osteoarthritis of knee, unspecified: Secondary | ICD-10-CM | POA: Insufficient documentation

## 2014-09-23 ENCOUNTER — Encounter: Payer: Worker's Compensation | Admitting: Family Medicine

## 2014-10-01 ENCOUNTER — Emergency Department (HOSPITAL_COMMUNITY)
Admission: EM | Admit: 2014-10-01 | Discharge: 2014-10-01 | Disposition: A | Discharge status: Home / Self Care | Payer: Medicare Other | Attending: Emergency Medicine | Admitting: Emergency Medicine

## 2014-10-01 ENCOUNTER — Encounter (HOSPITAL_COMMUNITY): Payer: Self-pay | Admitting: Emergency Medicine

## 2014-10-01 DIAGNOSIS — I639 Cerebral infarction, unspecified: Secondary | ICD-10-CM | POA: Insufficient documentation

## 2014-10-01 DIAGNOSIS — Z791 Long term (current) use of non-steroidal anti-inflammatories (NSAID): Secondary | ICD-10-CM | POA: Insufficient documentation

## 2014-10-01 DIAGNOSIS — Z79899 Other long term (current) drug therapy: Secondary | ICD-10-CM | POA: Diagnosis not present

## 2014-10-01 DIAGNOSIS — R42 Dizziness and giddiness: Secondary | ICD-10-CM | POA: Diagnosis present

## 2014-10-01 DIAGNOSIS — R930 Abnormal findings on diagnostic imaging of skull and head, not elsewhere classified: Secondary | ICD-10-CM | POA: Diagnosis not present

## 2014-10-01 DIAGNOSIS — I6930 Unspecified sequelae of cerebral infarction: Secondary | ICD-10-CM | POA: Insufficient documentation

## 2014-10-01 DIAGNOSIS — G510 Bell's palsy: Secondary | ICD-10-CM | POA: Diagnosis not present

## 2014-10-01 DIAGNOSIS — G529 Cranial nerve disorder, unspecified: Secondary | ICD-10-CM | POA: Insufficient documentation

## 2014-10-01 DIAGNOSIS — I10 Essential (primary) hypertension: Secondary | ICD-10-CM | POA: Insufficient documentation

## 2014-10-01 DIAGNOSIS — Z8673 Personal history of transient ischemic attack (TIA), and cerebral infarction without residual deficits: Secondary | ICD-10-CM | POA: Insufficient documentation

## 2014-10-01 DIAGNOSIS — Z7982 Long term (current) use of aspirin: Secondary | ICD-10-CM | POA: Diagnosis not present

## 2014-10-01 DIAGNOSIS — R9389 Abnormal findings on diagnostic imaging of other specified body structures: Secondary | ICD-10-CM

## 2014-10-01 LAB — BASIC METABOLIC PANEL
ANION GAP: 14 (ref 5–15)
BUN: 13 mg/dL (ref 6–23)
CHLORIDE: 104 meq/L (ref 96–112)
CO2: 22 meq/L (ref 19–32)
CREATININE: 1.06 mg/dL (ref 0.50–1.35)
Calcium: 9.4 mg/dL (ref 8.4–10.5)
GFR calc Af Amer: 82 mL/min — ABNORMAL LOW (ref >90)
GFR calc non Af Amer: 71 mL/min — ABNORMAL LOW (ref >90)
GLUCOSE: 118 mg/dL — AB (ref 70–99)
Potassium: 4.3 mEq/L (ref 3.7–5.3)
Sodium: 140 mEq/L (ref 137–147)

## 2014-10-01 LAB — CBC
HEMATOCRIT: 44.4 % (ref 39.0–52.0)
HEMOGLOBIN: 15.9 g/dL (ref 13.0–17.0)
MCH: 32.7 pg (ref 26.0–34.0)
MCHC: 35.8 g/dL (ref 30.0–36.0)
MCV: 91.4 fL (ref 78.0–100.0)
PLATELETS: 280 10*3/uL (ref 150–400)
RBC: 4.86 MIL/uL (ref 4.22–5.81)
RDW: 14.7 % (ref 11.5–15.5)
WBC: 11.5 10*3/uL — AB (ref 4.0–10.5)

## 2014-10-01 NOTE — ED Provider Notes (Signed)
CSN: [id number]     Arrival date & time 10/01/14  1647 History   First MD Initiated Contact with Patient 10/01/14 1943     Chief Complaint  Patient presents with  . Stroke Symptoms  . Dizziness     (Consider location/radiation/quality/duration/timing/severity/associated sxs/prior Treatment) HPI The patient reports he has no acute symptoms for which he presents to the emergency department. He reports for months he has had intermittent episodes of dizziness. They come on sporadically with a vertiginous quality to them. If he remains still it passes. He denies any associated focal weakness numbness tingling or gait problems. 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 Nothing new has occurred. Due to his dizziness he had been referred to get an MRI as an outpatient. The patient is not happy that he has been referred to the emergency department as he identifies his same symptoms as having been present for months. He reports there is nothing different today and he doesn't need this bill added on to his medical costs. Past Medical History  Diagnosis Date  . Hypertension    History reviewed. No pertinent past surgical history. No family history on file. History  Substance Use Topics  . Smoking status: Never Smoker   . Smokeless tobacco: Not on file  . Alcohol Use: No    Review of Systems 10 Systems reviewed and are negative for acute change except as noted in the HPI.    Allergies  Review of patient's allergies indicates no known allergies.  Home Medications   Prior to Admission medications   Medication Sig Start Date End Date Taking? Authorizing Provider  amLODipine-benazepril (LOTREL) 5-40 MG per capsule Take 1 capsule by mouth daily.   Yes Historical Provider, MD  famotidine (PEPCID) 20 MG tablet Take 1 tablet (20 mg total) by mouth 2 (two) times daily. 01/15/13  Yes Richardean Canalavid H Yao, MD  meloxicam (MOBIC) 15 MG tablet  Take 1 tablet (15 mg total) by mouth daily. 05/14/14  Yes Lenda KelpShane R Hudnall, MD  pantoprazole (PROTONIX) 20 MG tablet Take 2 tablets (40 mg total) by mouth daily. 01/15/13  Yes Richardean Canalavid H Yao, MD  aspirin 325 MG tablet Take 325 mg by mouth daily as needed for pain.    Historical Provider, MD   BP 144/55 mmHg  Pulse 109  Temp(Src) 98.5 F (36.9 C)  Resp 29  Ht 5\' 6"  (1.676 m)  Wt 190 lb (86.183 kg)  BMI 30.68 kg/m2  SpO2 97% Physical Exam  Constitutional: He is oriented to person, place, and time. He appears well-developed and well-nourished.  HENT:  Head: Normocephalic and atraumatic.  Eyes: EOM are normal. Pupils are equal, round, and reactive to light.  Neck: Neck supple.  Cardiovascular: Normal rate, regular rhythm, normal heart sounds and intact distal pulses.   Pulmonary/Chest: Effort normal and breath sounds normal.  Abdominal: Soft. Bowel sounds are normal. He exhibits no distension. There is no tenderness.  Musculoskeletal: Normal range of motion. He exhibits no edema.  Neurological: He is alert and oriented to person, place, and time. He has normal strength. A cranial nerve deficit (the patient has a left-sided facial tic and droop of the left mouth. he reports he's had this for several years in association with Bell's palsy.) is present. Coordination normal. GCS eye subscore is 4. GCS verbal subscore is 5. GCS motor subscore is 6.  The patient's gait is coordinated and symmetric. His upper  and lower strength strength testing is 5\5 in flexion and extension. Sensation is intact to light touch throughout.  Skin: Skin is warm, dry and intact.  Psychiatric: He has a normal mood and affect.    ED Course  Procedures (including critical care time) Labs Review Labs Reviewed  CBC - Abnormal; Notable for the following:    WBC 11.5 (*)    All other components within normal limits  BASIC METABOLIC PANEL - Abnormal; Notable for the following:    Glucose, Bld 118 (*)    GFR calc non Af Amer  71 (*)    GFR calc Af Amer 82 (*)    All other components within normal limits    Imaging Review No results found.   EKG Interpretation   Date/Time:  Tuesday October 01 2014 17:11:40 EST Ventricular Rate:  101 PR Interval:  192 QRS Duration: 104 QT Interval:  350 QTC Calculation: 453 R Axis:   91 Text Interpretation:  Sinus tachycardia Rightward axis Borderline ECG no  change. agree Confirmed by Donnald GarrePfeiffer, MD, Lebron ConnersMarcy [id number]) on 10/02/2014  1:17:00 AM      MDM   Final diagnoses:  Dizziness  Abnormal MRI  Acute CVA (cerebrovascular accident)   The patient does not present with any acute findings of CVA. His neurologic examination is normal except for a chronic Bell's palsy. His MRI does indicate CVA.  He however is asymptomatic and does not wish to stay in the hospital. He feels that there is nothing different now that is not been the same for a number of months.the patient is advised to work closely with his family physician for ongoing treatment. The patient is on a daily aspirin and routine blood pressure medications. He reports he has missed his evening dose of medication due to the MRI. He reports he is however compliant with medications.    Arby BarretteMarcy Lexia Vandevender, MD 10/02/14 [phone]

## 2014-10-01 NOTE — Discharge Instructions (Signed)
Dizziness °Dizziness is a common problem. It is a feeling of unsteadiness or light-headedness. You may feel like you are about to faint. Dizziness can lead to injury if you stumble or fall. A person of any age group can suffer from dizziness, but dizziness is more common in older adults. °CAUSES  °Dizziness can be caused by many different things, including: °· Middle ear problems. °· Standing for too long. °· Infections. °· An allergic reaction. °· Aging. °· An emotional response to something, such as the sight of blood. °· Side effects of medicines. °· Tiredness. °· Problems with circulation or blood pressure. °· Excessive use of alcohol or medicines, or illegal drug use. °· Breathing too fast (hyperventilation). °· An irregular heart rhythm (arrhythmia). °· A low red blood cell count (anemia). °· Pregnancy. °· Vomiting, diarrhea, fever, or other illnesses that cause body fluid loss (dehydration). °· Diseases or conditions such as Parkinson's disease, high blood pressure (hypertension), diabetes, and thyroid problems. °· Exposure to extreme heat. °DIAGNOSIS  °Your health care provider will ask about your symptoms, perform a physical exam, and perform an electrocardiogram (ECG) to record the electrical activity of your heart. Your health care provider may also perform other heart or blood tests to determine the cause of your dizziness. These may include: °· Transthoracic echocardiogram (TTE). During echocardiography, sound waves are used to evaluate how blood flows through your heart. °· Transesophageal echocardiogram (TEE). °· Cardiac monitoring. This allows your health care provider to monitor your heart rate and rhythm in real time. °· Holter monitor. This is a portable device that records your heartbeat and can help diagnose heart arrhythmias. It allows your health care provider to track your heart activity for several days if needed. °· Stress tests by exercise or by giving medicine that makes the heart beat  faster. °TREATMENT  °Treatment of dizziness depends on the cause of your symptoms and can vary greatly. °HOME CARE INSTRUCTIONS  °· Drink enough fluids to keep your urine clear or pale yellow. This is especially important in very hot weather. In older adults, it is also important in cold weather. °· Take your medicine exactly as directed if your dizziness is caused by medicines. When taking blood pressure medicines, it is especially important to get up slowly. °¨ Rise slowly from chairs and steady yourself until you feel okay. °¨ In the morning, first sit up on the side of the bed. When you feel okay, stand slowly while holding onto something until you know your balance is fine. °· Move your legs often if you need to stand in one place for a long time. Tighten and relax your muscles in your legs while standing. °· Have someone stay with you for 1-2 days if dizziness continues to be a problem. Do this until you feel you are well enough to stay alone. Have the person call your health care provider if he or she notices changes in you that are concerning. °· Do not drive or use heavy machinery if you feel dizzy. °· Do not drink alcohol. °SEEK IMMEDIATE MEDICAL CARE IF:  °· Your dizziness or light-headedness gets worse. °· You feel nauseous or vomit. °· You have problems talking, walking, or using your arms, hands, or legs. °· You feel weak. °· You are not thinking clearly or you have trouble forming sentences. It may take a friend or family member to notice this. °· You have chest pain, abdominal pain, shortness of breath, or sweating. °· Your vision changes. °· You notice   any bleeding.  You have side effects from medicine that seems to be getting worse rather than better. MAKE SURE YOU:   Understand these instructions.  Will watch your condition.  Will get help right away if you are not doing well or get worse. Document Released: 04/27/2001 Document Revised: 11/06/2013 Document Reviewed: 05/21/2011 [facility]  Patient Information 2015 [place], [place]. This information is not intended to replace advice given to you by your health care provider. Make sure you discuss any questions you have with your health care provider. Stroke Prevention Some medical conditions and behaviors are associated with an increased chance of having a stroke. You may prevent a stroke by making healthy choices and managing medical conditions. HOW CAN I REDUCE MY RISK OF HAVING A STROKE?   Stay physically active. Get at least 30 minutes of activity on most or all days.  Do not smoke. It may also be helpful to avoid exposure to secondhand smoke.  Limit alcohol use. Moderate alcohol use is considered to be:  No more than 2 drinks per day for men.  No more than 1 drink per day for nonpregnant women.  Eat healthy foods. This involves:  Eating 5 or more servings of fruits and vegetables a day.  Making dietary changes that address high blood pressure (hypertension), high cholesterol, diabetes, or obesity.  Manage your cholesterol levels.  Making food choices that are high in fiber and low in saturated fat, trans fat, and cholesterol may control cholesterol levels.  Take any prescribed medicines to control cholesterol as directed by your health care provider.  Manage your diabetes.  Controlling your carbohydrate and sugar intake is recommended to manage diabetes.  Take any prescribed medicines to control diabetes as directed by your health care provider.  Control your hypertension.  Making food choices that are low in salt (sodium), saturated fat, trans fat, and cholesterol is recommended to manage hypertension.  Take any prescribed medicines to control hypertension as directed by your health care provider.  Maintain a healthy weight.  Reducing calorie intake and making food choices that are low in sodium, saturated fat, trans fat, and cholesterol are recommended to manage weight.  Stop drug abuse.  Avoid taking  birth control pills.  Talk to your health care provider about the risks of taking birth control pills if you are over 133 years old, smoke, get migraines, or have ever had a blood clot.  Get evaluated for sleep disorders (sleep apnea).  Talk to your health care provider about getting a sleep evaluation if you snore a lot or have excessive sleepiness.  Take medicines only as directed by your health care provider.  For some people, aspirin or blood thinners (anticoagulants) are helpful in reducing the risk of forming abnormal blood clots that can lead to stroke. If you have the irregular heart rhythm of atrial fibrillation, you should be on a blood thinner unless there is a good reason you cannot take them.  Understand all your medicine instructions.  Make sure that other conditions (such as anemia or atherosclerosis) are addressed. SEEK IMMEDIATE MEDICAL CARE IF:   You have sudden weakness or numbness of the face, arm, or leg, especially on one side of the body.  Your face or eyelid droops to one side.  You have sudden confusion.  You have trouble speaking (aphasia) or understanding.  You have sudden trouble seeing in one or both eyes.  You have sudden trouble walking.  You have dizziness.  You have a loss of balance  or coordination.  You have a sudden, severe headache with no known cause.  You have new chest pain or an irregular heartbeat. Any of these symptoms may represent a serious problem that is an emergency. Do not wait to see if the symptoms will go away. Get medical help at once. Call your local emergency services (911 in [place] Do not drive yourself to the hospital. Document Released: 12/09/2004 Document Revised: 03/18/2014 Document Reviewed: 05/04/2013 [facility] Patient Information 2015 [place], [place]. This information is not intended to replace advice given to you by your health care provider. Make sure you discuss any questions you have with your health care  provider.

## 2014-10-01 NOTE — ED Notes (Signed)
Pt reports he cannot stay any longer. MD aware.

## 2014-10-01 NOTE — ED Notes (Signed)
Pt reports hx of HTN, states he did not take his BP medication today.

## 2014-10-01 NOTE — ED Notes (Signed)
Pt. Stated, I had a MRI today and they called me and said i'VE HAD A STROKE  Awhile ago and told to come here. I['ve had the MRI for dizziness.  I have no symptoms and no deficits.

## 2014-10-31 DIAGNOSIS — E041 Nontoxic single thyroid nodule: Secondary | ICD-10-CM | POA: Insufficient documentation

## 2014-10-31 DIAGNOSIS — E785 Hyperlipidemia, unspecified: Secondary | ICD-10-CM | POA: Insufficient documentation

## 2014-11-18 DIAGNOSIS — R42 Dizziness and giddiness: Secondary | ICD-10-CM | POA: Diagnosis not present

## 2014-11-18 DIAGNOSIS — R2681 Unsteadiness on feet: Secondary | ICD-10-CM | POA: Diagnosis not present

## 2014-12-03 DIAGNOSIS — R2681 Unsteadiness on feet: Secondary | ICD-10-CM | POA: Diagnosis not present

## 2014-12-03 DIAGNOSIS — R42 Dizziness and giddiness: Secondary | ICD-10-CM | POA: Diagnosis not present

## 2014-12-17 DIAGNOSIS — R2681 Unsteadiness on feet: Secondary | ICD-10-CM | POA: Diagnosis not present

## 2014-12-17 DIAGNOSIS — R42 Dizziness and giddiness: Secondary | ICD-10-CM | POA: Diagnosis not present

## 2014-12-25 DIAGNOSIS — M25471 Effusion, right ankle: Secondary | ICD-10-CM | POA: Diagnosis not present

## 2014-12-25 DIAGNOSIS — M25562 Pain in left knee: Secondary | ICD-10-CM | POA: Diagnosis not present

## 2015-01-08 DIAGNOSIS — E785 Hyperlipidemia, unspecified: Secondary | ICD-10-CM | POA: Diagnosis not present

## 2015-01-08 DIAGNOSIS — Z8673 Personal history of transient ischemic attack (TIA), and cerebral infarction without residual deficits: Secondary | ICD-10-CM | POA: Diagnosis not present

## 2015-01-08 DIAGNOSIS — M109 Gout, unspecified: Secondary | ICD-10-CM | POA: Insufficient documentation

## 2015-01-08 DIAGNOSIS — I10 Essential (primary) hypertension: Secondary | ICD-10-CM | POA: Diagnosis not present

## 2015-01-13 DIAGNOSIS — Z8673 Personal history of transient ischemic attack (TIA), and cerebral infarction without residual deficits: Secondary | ICD-10-CM | POA: Diagnosis not present

## 2015-01-13 DIAGNOSIS — M109 Gout, unspecified: Secondary | ICD-10-CM | POA: Diagnosis not present

## 2015-01-13 DIAGNOSIS — M179 Osteoarthritis of knee, unspecified: Secondary | ICD-10-CM | POA: Diagnosis not present

## 2015-01-13 DIAGNOSIS — E785 Hyperlipidemia, unspecified: Secondary | ICD-10-CM | POA: Diagnosis not present

## 2015-01-13 DIAGNOSIS — I10 Essential (primary) hypertension: Secondary | ICD-10-CM | POA: Diagnosis not present

## 2015-02-26 DIAGNOSIS — I10 Essential (primary) hypertension: Secondary | ICD-10-CM | POA: Diagnosis not present

## 2015-03-18 DIAGNOSIS — M179 Osteoarthritis of knee, unspecified: Secondary | ICD-10-CM | POA: Diagnosis not present

## 2015-03-19 ENCOUNTER — Encounter: Payer: Self-pay | Admitting: Family Medicine

## 2015-03-19 ENCOUNTER — Ambulatory Visit (INDEPENDENT_AMBULATORY_CARE_PROVIDER_SITE_OTHER): Payer: Medicare Other | Admitting: Family Medicine

## 2015-03-19 VITALS — BP 144/89 | HR 76 | Ht 65.0 in | Wt 199.0 lb

## 2015-03-19 DIAGNOSIS — M1712 Unilateral primary osteoarthritis, left knee: Secondary | ICD-10-CM | POA: Diagnosis not present

## 2015-03-19 DIAGNOSIS — M25562 Pain in left knee: Secondary | ICD-10-CM | POA: Diagnosis present

## 2015-03-19 MED ORDER — METHYLPREDNISOLONE ACETATE 40 MG/ML IJ SUSP
40.0000 mg | Freq: Once | INTRAMUSCULAR | Status: AC
  Administered 2015-03-19: 40 mg via INTRA_ARTICULAR

## 2015-03-19 NOTE — Patient Instructions (Signed)
Your pain is due to arthritis of this knee. These are the medicines you can take for this: Take tylenol 500mg  1-2 tabs three times a day for pain. Meloxicam daily with food Glucosamine sulfate 750mg  twice a day is a supplement that may help. Capsaicin, aspercreme, or biofreeze topically up to four times a day may also help with pain. Cortisone injections are an option - you were given one of these today. If cortisone injections do not help, there are different types of shots that may help but they take longer to take effect (the gel shots). It's important that you continue to stay active. Straight leg raises, knee extensions 3 sets of 10 once a day (add ankle weight if these become too easy). Start physical therapy to strengthen muscles around the joint that hurts to take pressure off of the joint itself. Shoe inserts with good arch support may be helpful. Walker or cane if needed. Heat or ice 15 minutes at a time 3-4 times a day as needed to help with pain. Water aerobics and cycling with low resistance are the best two types of exercise for arthritis. Follow up with me in 5-6 weeks.

## 2015-03-20 NOTE — Assessment & Plan Note (Signed)
2/2 advanced medial and PF arthritis. Injection given today.  Will start physical therapy again and HEP.  Tylenol, meloxicam, topical medications, glucosamine discussed.  F/u in 5-6 weeks.  After informed written consent, patient was seated on exam table. Left knee was prepped with alcohol swab and utilizing anteromedial approach, patient's left knee was injected intraarticularly with 3:1 marcaine: depomedrol. Patient tolerated the procedure well without immediate complications.

## 2015-03-20 NOTE — Progress Notes (Signed)
Patient ID: Vincent Cummings, male   DOB: 1946/05/10, 69 y.o.   MRN: [id number]  PCP: Dois DavenportICHTER,KAREN L., MD  Subjective:   HPI: Patient is a 69 y.o. male here for left knee pain.  1/27: Patient reports on 1/5 he was coming down a ladder at work when he missed the last 3 rungs. Caused him to twist his left knee. Some swelling initially. Taking meloxicam from a different injury in past. Swelling has resolved. No catching, locking, giving out. No prior issues with this knee and no surgeries here. Pain 0/10 at rest, 5/10 with climbing, stairs, walking.  4/9: Patient reports he has improved since last visit. Pain up to 4/10 at worst, no pain at rest. Difficulty climbing stairs and up and down ladder. Unable to fully flex because this hurts. No catching, locking, giving out.  5/19: Patient reports he is doing well since last visit. Doing physical therapy and home exercises. Difficulty with squatting, running, some when climbing. Happy with his progress. Takes meloxicam as needed.  6/30: Patient reports overall he's doing well. Still has problems by end of day, with squatting and climbing. Taking meloxicam - has trouble if he doesn't take this. No swelling. Would like to try injeciton at this point.  06/25/14: Patient doing well. Injection has helped. No swelling. Difficulties squatting, running but much better. Takes meloxicam as needed. Done with physical therapy. No catching, locking, giving out.  03/19/15: Patient reports over past several weeks his left knee has started hurting again. No new injuries. Worse with using ladder, squatting. Previously did PT and meloxicam. Injection also helped.  Past Medical History  Diagnosis Date  . Hypertension     Current Outpatient Prescriptions on File Prior to Visit  Medication Sig Dispense Refill  . aspirin 325 MG tablet Take 325 mg by mouth daily as needed for pain.    . famotidine (PEPCID) 20 MG tablet Take 1 tablet (20  mg total) by mouth 2 (two) times daily. 10 tablet 0  . meloxicam (MOBIC) 15 MG tablet Take 1 tablet (15 mg total) by mouth daily. 30 tablet 2  . pantoprazole (PROTONIX) 20 MG tablet Take 2 tablets (40 mg total) by mouth daily. 30 tablet 0   No current facility-administered medications on file prior to visit.    No past surgical history on file.  Allergies  Allergen Reactions  . Atorvastatin Itching and Rash    History   Social History  . Marital Status: Married    Spouse Name: N/A  . Number of Children: N/A  . Years of Education: N/A   Occupational History  . Not on file.   Social History Main Topics  . Smoking status: Never Smoker   . Smokeless tobacco: Not on file  . Alcohol Use: No  . Drug Use: Not on file  . Sexual Activity: Not on file   Other Topics Concern  . Not on file   Social History Narrative    No family history on file.  BP 144/89 mmHg  Pulse 76  Ht 5\' 5"  (1.651 m)  Wt 199 lb (90.266 kg)  BMI 33.12 kg/m2  Review of Systems: See HPI above.    Objective:  Physical Exam:  Gen: NAD  Left knee: No gross deformity, ecchymoses, effusion. Minimal TTP medial, lateral joint lines.  No other tenderness. FROM. Negative ant/post drawers. Negative valgus/varus testing. Negative lachmanns. Negative mcmurrays, apleys, patellar apprehension. NV intact distally.    Assessment & Plan:  1. Left knee pain -  2/2 advanced medial and PF arthritis. Injection given today.  Will start physical therapy again and HEP.  Tylenol, meloxicam, topical medications, glucosamine discussed.  F/u in 5-6 weeks.  After informed written consent, patient was seated on exam table. Left knee was prepped with alcohol swab and utilizing anteromedial approach, patient's left knee was injected intraarticularly with 3:1 marcaine: depomedrol. Patient tolerated the procedure well without immediate complications.

## 2015-04-30 ENCOUNTER — Ambulatory Visit (INDEPENDENT_AMBULATORY_CARE_PROVIDER_SITE_OTHER): Payer: Medicare Other | Admitting: Family Medicine

## 2015-04-30 ENCOUNTER — Encounter: Payer: Self-pay | Admitting: Family Medicine

## 2015-04-30 VITALS — BP 163/105 | HR 88 | Ht 65.0 in | Wt 198.0 lb

## 2015-04-30 DIAGNOSIS — M25562 Pain in left knee: Secondary | ICD-10-CM | POA: Diagnosis present

## 2015-05-01 NOTE — Progress Notes (Signed)
Patient ID: Vincent Cummings, male   DOB: January 03, 1946, 69 y.o.   MRN: [id number]  PCP: [name]., MD  Subjective:   HPI: Patient is a 69 y.o. male here for left knee pain.  1/27: Patient reports on 1/5 he was coming down a ladder at work when he missed the last 3 rungs. Caused him to twist his left knee. Some swelling initially. Taking meloxicam from a different injury in past. Swelling has resolved. No catching, locking, giving out. No prior issues with this knee and no surgeries here. Pain 0/10 at rest, 5/10 with climbing, stairs, walking.  4/9: Patient reports he has improved since last visit. Pain up to 4/10 at worst, no pain at rest. Difficulty climbing stairs and up and down ladder. Unable to fully flex because this hurts. No catching, locking, giving out.  5/19: Patient reports he is doing well since last visit. Doing physical therapy and home exercises. Difficulty with squatting, running, some when climbing. Happy with his progress. Takes meloxicam as needed.  6/30: Patient reports overall he's doing well. Still has problems by end of day, with squatting and climbing. Taking meloxicam - has trouble if he doesn't take this. No swelling. Would like to try injeciton at this point.  06/25/14: Patient doing well. Injection has helped. No swelling. Difficulties squatting, running but much better. Takes meloxicam as needed. Done with physical therapy. No catching, locking, giving out.  03/19/15: Patient reports over past several weeks his left knee has started hurting again. No new injuries. Worse with using ladder, squatting. Previously did PT and meloxicam. Injection also helped.  6/15: Patient reports overall he's doing well. Pain level up to 4/10 at times. Injection has helped. Did not do physical therapy but is doing home exercises. Primary issue is with climbing, stairs. Takes meloxicam only if needed now.  Past Medical History  Diagnosis Date   . Hypertension     Current Outpatient Prescriptions on File Prior to Visit  Medication Sig Dispense Refill  . amLODipine-benazepril (LOTREL) 10-40 MG per capsule Take 1 capsule by mouth daily.  11  . aspirin 325 MG tablet Take 325 mg by mouth daily as needed for pain.    Marland Kitchen colchicine 0.6 MG tablet   0  . famotidine (PEPCID) 20 MG tablet Take 1 tablet (20 mg total) by mouth 2 (two) times daily. 10 tablet 0  . meloxicam (MOBIC) 15 MG tablet Take 1 tablet (15 mg total) by mouth daily. 30 tablet 2  . metoprolol succinate (TOPROL-XL) 25 MG 24 hr tablet   11  . pantoprazole (PROTONIX) 20 MG tablet Take 2 tablets (40 mg total) by mouth daily. 30 tablet 0  . spironolactone (ALDACTONE) 25 MG tablet   5   No current facility-administered medications on file prior to visit.    No past surgical history on file.  Allergies  Allergen Reactions  . Atorvastatin Itching and Rash    History   Social History  . Marital Status: Married    Spouse Name: N/A  . Number of Children: N/A  . Years of Education: N/A   Occupational History  . Not on file.   Social History Main Topics  . Smoking status: Never Smoker   . Smokeless tobacco: Not on file  . Alcohol Use: No  . Drug Use: Not on file  . Sexual Activity: Not on file   Other Topics Concern  . Not on file   Social History Narrative    No family history on  file.  BP 163/105 mmHg  Pulse 88  Ht 5\' 5"  (1.651 m)  Wt 198 lb (89.812 kg)  BMI 32.95 kg/m2  Review of Systems: See HPI above.    Objective:  Physical Exam:  Gen: NAD  Left knee: No gross deformity, ecchymoses, effusion. No TTP medial, lateral joint lines.  No other tenderness. FROM. Negative ant/post drawers. Negative valgus/varus testing. Negative lachmanns. Negative mcmurrays, apleys, patellar apprehension. NV intact distally.    Assessment & Plan:  1. Left knee pain - 2/2 advanced medial and PF arthritis. S/p injection.  Pleased with his current functional  level.  Continue HEP, meloxicam only if needed.  F/u prn.

## 2015-05-01 NOTE — Assessment & Plan Note (Signed)
2/2 advanced medial and PF arthritis. S/p injection.  Pleased with his current functional level.  Continue HEP, meloxicam only if needed.  F/u prn.

## 2015-07-16 DIAGNOSIS — M79675 Pain in left toe(s): Secondary | ICD-10-CM | POA: Diagnosis not present

## 2015-07-16 DIAGNOSIS — M25471 Effusion, right ankle: Secondary | ICD-10-CM | POA: Diagnosis not present

## 2016-06-09 DIAGNOSIS — Z8673 Personal history of transient ischemic attack (TIA), and cerebral infarction without residual deficits: Secondary | ICD-10-CM | POA: Diagnosis not present

## 2016-06-09 DIAGNOSIS — M109 Gout, unspecified: Secondary | ICD-10-CM | POA: Diagnosis not present

## 2016-06-09 DIAGNOSIS — I10 Essential (primary) hypertension: Secondary | ICD-10-CM | POA: Diagnosis not present

## 2016-06-25 DIAGNOSIS — M109 Gout, unspecified: Secondary | ICD-10-CM | POA: Diagnosis not present

## 2016-06-29 DIAGNOSIS — M25571 Pain in right ankle and joints of right foot: Secondary | ICD-10-CM | POA: Diagnosis not present

## 2016-06-29 DIAGNOSIS — M1712 Unilateral primary osteoarthritis, left knee: Secondary | ICD-10-CM | POA: Diagnosis not present

## 2017-02-23 DIAGNOSIS — M25561 Pain in right knee: Secondary | ICD-10-CM | POA: Diagnosis not present

## 2017-02-23 DIAGNOSIS — M25562 Pain in left knee: Secondary | ICD-10-CM | POA: Diagnosis not present

## 2017-02-23 DIAGNOSIS — M1711 Unilateral primary osteoarthritis, right knee: Secondary | ICD-10-CM | POA: Diagnosis not present

## 2017-02-23 DIAGNOSIS — M1712 Unilateral primary osteoarthritis, left knee: Secondary | ICD-10-CM | POA: Diagnosis not present

## 2017-04-14 DIAGNOSIS — M1712 Unilateral primary osteoarthritis, left knee: Secondary | ICD-10-CM | POA: Diagnosis not present

## 2017-04-14 DIAGNOSIS — M1711 Unilateral primary osteoarthritis, right knee: Secondary | ICD-10-CM | POA: Diagnosis not present

## 2017-04-21 DIAGNOSIS — M1711 Unilateral primary osteoarthritis, right knee: Secondary | ICD-10-CM | POA: Diagnosis not present

## 2017-04-21 DIAGNOSIS — M1712 Unilateral primary osteoarthritis, left knee: Secondary | ICD-10-CM | POA: Diagnosis not present

## 2017-04-28 DIAGNOSIS — M1712 Unilateral primary osteoarthritis, left knee: Secondary | ICD-10-CM | POA: Diagnosis not present

## 2017-04-28 DIAGNOSIS — M1711 Unilateral primary osteoarthritis, right knee: Secondary | ICD-10-CM | POA: Diagnosis not present

## 2017-11-15 DIAGNOSIS — J1282 Pneumonia due to coronavirus disease 2019: Secondary | ICD-10-CM

## 2017-11-15 DIAGNOSIS — U071 COVID-19: Secondary | ICD-10-CM

## 2017-11-15 HISTORY — DX: COVID-19: U07.1

## 2017-11-15 HISTORY — DX: Pneumonia due to coronavirus disease 2019: J12.82

## 2018-12-04 DIAGNOSIS — M25562 Pain in left knee: Secondary | ICD-10-CM | POA: Diagnosis not present

## 2018-12-04 DIAGNOSIS — M25561 Pain in right knee: Secondary | ICD-10-CM | POA: Diagnosis not present

## 2020-02-26 ENCOUNTER — Inpatient Hospital Stay (HOSPITAL_COMMUNITY)
Admission: EM | Admit: 2020-02-26 | Discharge: 2020-03-04 | DRG: 177 | Disposition: A | Discharge status: Skilled Nursing Facility | Payer: Medicare Other | Attending: Internal Medicine | Admitting: Internal Medicine

## 2020-02-26 ENCOUNTER — Other Ambulatory Visit: Payer: Self-pay

## 2020-02-26 ENCOUNTER — Emergency Department (HOSPITAL_COMMUNITY): Payer: Medicare Other

## 2020-02-26 DIAGNOSIS — F0390 Unspecified dementia without behavioral disturbance: Secondary | ICD-10-CM | POA: Diagnosis present

## 2020-02-26 DIAGNOSIS — R739 Hyperglycemia, unspecified: Secondary | ICD-10-CM | POA: Diagnosis present

## 2020-02-26 DIAGNOSIS — G9341 Metabolic encephalopathy: Secondary | ICD-10-CM | POA: Diagnosis not present

## 2020-02-26 DIAGNOSIS — Z23 Encounter for immunization: Secondary | ICD-10-CM

## 2020-02-26 DIAGNOSIS — J9601 Acute respiratory failure with hypoxia: Secondary | ICD-10-CM | POA: Diagnosis present

## 2020-02-26 DIAGNOSIS — R4182 Altered mental status, unspecified: Principal | ICD-10-CM

## 2020-02-26 DIAGNOSIS — R29810 Facial weakness: Secondary | ICD-10-CM | POA: Diagnosis not present

## 2020-02-26 DIAGNOSIS — A4189 Other specified sepsis: Secondary | ICD-10-CM | POA: Diagnosis present

## 2020-02-26 DIAGNOSIS — R0902 Hypoxemia: Secondary | ICD-10-CM | POA: Diagnosis not present

## 2020-02-26 DIAGNOSIS — E876 Hypokalemia: Secondary | ICD-10-CM | POA: Diagnosis present

## 2020-02-26 DIAGNOSIS — A419 Sepsis, unspecified organism: Secondary | ICD-10-CM

## 2020-02-26 DIAGNOSIS — U071 COVID-19: Secondary | ICD-10-CM | POA: Diagnosis present

## 2020-02-26 DIAGNOSIS — Z79899 Other long term (current) drug therapy: Secondary | ICD-10-CM

## 2020-02-26 DIAGNOSIS — Z791 Long term (current) use of non-steroidal anti-inflammatories (NSAID): Secondary | ICD-10-CM

## 2020-02-26 DIAGNOSIS — G9340 Encephalopathy, unspecified: Secondary | ICD-10-CM | POA: Diagnosis not present

## 2020-02-26 DIAGNOSIS — J1282 Pneumonia due to coronavirus disease 2019: Secondary | ICD-10-CM | POA: Diagnosis present

## 2020-02-26 DIAGNOSIS — R531 Weakness: Secondary | ICD-10-CM | POA: Diagnosis not present

## 2020-02-26 DIAGNOSIS — Z7982 Long term (current) use of aspirin: Secondary | ICD-10-CM

## 2020-02-26 DIAGNOSIS — M109 Gout, unspecified: Secondary | ICD-10-CM | POA: Diagnosis present

## 2020-02-26 DIAGNOSIS — L89151 Pressure ulcer of sacral region, stage 1: Secondary | ICD-10-CM | POA: Diagnosis present

## 2020-02-26 DIAGNOSIS — N179 Acute kidney failure, unspecified: Secondary | ICD-10-CM | POA: Diagnosis not present

## 2020-02-26 DIAGNOSIS — R410 Disorientation, unspecified: Secondary | ICD-10-CM | POA: Diagnosis not present

## 2020-02-26 DIAGNOSIS — R404 Transient alteration of awareness: Secondary | ICD-10-CM | POA: Diagnosis not present

## 2020-02-26 DIAGNOSIS — R6520 Severe sepsis without septic shock: Secondary | ICD-10-CM | POA: Diagnosis not present

## 2020-02-26 DIAGNOSIS — J90 Pleural effusion, not elsewhere classified: Secondary | ICD-10-CM | POA: Diagnosis not present

## 2020-02-26 DIAGNOSIS — K219 Gastro-esophageal reflux disease without esophagitis: Secondary | ICD-10-CM | POA: Diagnosis present

## 2020-02-26 DIAGNOSIS — I10 Essential (primary) hypertension: Secondary | ICD-10-CM | POA: Diagnosis present

## 2020-02-26 DIAGNOSIS — I499 Cardiac arrhythmia, unspecified: Secondary | ICD-10-CM | POA: Diagnosis not present

## 2020-02-26 HISTORY — DX: Gout, unspecified: M10.9

## 2020-02-26 HISTORY — DX: Essential (primary) hypertension: I10

## 2020-02-26 HISTORY — DX: Gastro-esophageal reflux disease without esophagitis: K21.9

## 2020-02-26 LAB — COMPREHENSIVE METABOLIC PANEL
ALT: 25 U/L (ref 0–44)
AST: 57 U/L — ABNORMAL HIGH (ref 15–41)
Albumin: 2.8 g/dL — ABNORMAL LOW (ref 3.5–5.0)
Alkaline Phosphatase: 51 U/L (ref 38–126)
Anion gap: 11 (ref 5–15)
BUN: 15 mg/dL (ref 8–23)
CO2: 21 mmol/L — ABNORMAL LOW (ref 22–32)
Calcium: 8.1 mg/dL — ABNORMAL LOW (ref 8.9–10.3)
Chloride: 103 mmol/L (ref 98–111)
Creatinine, Ser: 1.4 mg/dL — ABNORMAL HIGH (ref 0.61–1.24)
GFR calc Af Amer: 58 mL/min — ABNORMAL LOW (ref >60)
GFR calc non Af Amer: 50 mL/min — ABNORMAL LOW (ref >60)
Glucose, Bld: 121 mg/dL — ABNORMAL HIGH (ref 70–99)
Potassium: 4.4 mmol/L (ref 3.5–5.1)
Sodium: 135 mmol/L (ref 135–145)
Total Bilirubin: 2 mg/dL — ABNORMAL HIGH (ref 0.3–1.2)
Total Protein: 6.1 g/dL — ABNORMAL LOW (ref 6.5–8.1)

## 2020-02-26 LAB — CBC WITH DIFFERENTIAL/PLATELET
Abs Immature Granulocytes: 0.02 10*3/uL (ref 0.00–0.07)
Basophils Absolute: 0 10*3/uL (ref 0.0–0.1)
Basophils Relative: 0 %
Eosinophils Absolute: 0 10*3/uL (ref 0.0–0.5)
Eosinophils Relative: 0 %
HCT: 48.9 % (ref 39.0–52.0)
Hemoglobin: 17.1 g/dL — ABNORMAL HIGH (ref 13.0–17.0)
Immature Granulocytes: 0 %
Lymphocytes Relative: 20 %
Lymphs Abs: 1.2 10*3/uL (ref 0.7–4.0)
MCH: 32 pg (ref 26.0–34.0)
MCHC: 35 g/dL (ref 30.0–36.0)
MCV: 91.4 fL (ref 80.0–100.0)
Monocytes Absolute: 0.7 10*3/uL (ref 0.1–1.0)
Monocytes Relative: 12 %
Neutro Abs: 3.9 10*3/uL (ref 1.7–7.7)
Neutrophils Relative %: 68 %
Platelets: 133 10*3/uL — ABNORMAL LOW (ref 150–400)
RBC: 5.35 MIL/uL (ref 4.22–5.81)
RDW: 14.3 % (ref 11.5–15.5)
WBC: 5.8 10*3/uL (ref 4.0–10.5)
nRBC: 0 % (ref 0.0–0.2)

## 2020-02-26 LAB — CBG MONITORING, ED: Glucose-Capillary: 116 mg/dL — ABNORMAL HIGH (ref 70–99)

## 2020-02-26 LAB — ETHANOL: Alcohol, Ethyl (B): <10 mg/dL (ref <10)

## 2020-02-26 LAB — LACTIC ACID, PLASMA: Lactic Acid, Venous: 1.9 mmol/L (ref 0.5–1.9)

## 2020-02-26 MED ORDER — SODIUM CHLORIDE 0.9 % IV BOLUS (SEPSIS)
1000.0000 mL | Freq: Once | INTRAVENOUS | Status: AC
  Administered 2020-02-26: 1000 mL via INTRAVENOUS

## 2020-02-26 MED ORDER — VANCOMYCIN HCL IN DEXTROSE 1-5 GM/200ML-% IV SOLN
1000.0000 mg | Freq: Once | INTRAVENOUS | Status: AC
  Administered 2020-02-27: 1000 mg via INTRAVENOUS
  Filled 2020-02-26: qty 200

## 2020-02-26 MED ORDER — PIPERACILLIN-TAZOBACTAM 3.375 G IVPB 30 MIN
3.3750 g | Freq: Once | INTRAVENOUS | Status: AC
  Administered 2020-02-27: 3.375 g via INTRAVENOUS
  Filled 2020-02-26: qty 50

## 2020-02-26 MED ORDER — ACETAMINOPHEN 650 MG RE SUPP
650.0000 mg | Freq: Once | RECTAL | Status: AC
  Administered 2020-02-26: 650 mg via RECTAL
  Filled 2020-02-26: qty 1

## 2020-02-26 MED ORDER — CLOTRIMAZOLE 1 % EX CREA
TOPICAL_CREAM | Freq: Two times a day (BID) | CUTANEOUS | Status: DC
  Filled 2020-02-26 (×2): qty 15

## 2020-02-26 NOTE — ED Triage Notes (Signed)
PT BIB [facility] c/o AMS and increased weakness.   [employer] staff reports seeing pt yesterday 4/11 at 2 pm. Staff called EMS today concerns pt found in car with usual behavior describes difficulty walking d/t weakness, slurred speech, and decrease responsiveness. At baseline, pt ambulatory.   On arrival pt A&Ox1 self.   EMS VS BP 184/11 SpO2 98% HR 102 CBG 130 400 mL saline bolus given

## 2020-02-26 NOTE — ED Provider Notes (Signed)
[facility] EMERGENCY DEPARTMENT Provider Note   CSN: [id number] Arrival date & time: 02/26/20  2218     History Chief Complaint  Patient presents with  . Altered Mental Status  . Weakness    Vincent Cummings is a 74 y.o. male.  Patient is a 74 year old gentleman with past medical history of hyperlipidemia, hypertension presenting to the emergency department via EMS for altered mental status.  Patient is a level 5 caveat and cannot provide history due to altered mental status.  Per EMS report, he was found wandering outside of a [employer] and appeared altered to the staff.  The patient is possibly homeless and has no family members listed on file.  The patient was mildly febrile and tachycardic and altered.        Past Medical History:  Diagnosis Date  . Hypertension     Patient Active Problem List   Diagnosis Date Noted  . Gout 01/08/2015  . Thyroid nodule 10/31/2014  . HLD (hyperlipidemia) 10/31/2014  . Cerebral vascular accident (HCC) 10/01/2014  . Arthritis of knee, degenerative 07/30/2014  . Dizziness 07/30/2014  . Left knee pain 12/16/2013  . BP (high blood pressure) 03/17/2012    No past surgical history on file.     No family history on file.  Social History   Tobacco Use  . Smoking status: Never Smoker  Substance Use Topics  . Alcohol use: No    Alcohol/week: 0.0 standard drinks  . Drug use: Not on file    Home Medications Prior to Admission medications   Medication Sig Start Date End Date Taking? Authorizing Provider  amLODipine-benazepril (LOTREL) 10-40 MG per capsule Take 1 capsule by mouth daily. 02/25/15   [provider]  aspirin 325 MG tablet Take 325 mg by mouth daily as needed for pain.    [provider]  colchicine 0.6 MG tablet  12/25/14   [provider]  famotidine (PEPCID) 20 MG tablet Take 1 tablet (20 mg total) by mouth 2 (two) times daily. 01/15/13   Charlynne Pander, MD    meloxicam (MOBIC) 15 MG tablet Take 1 tablet (15 mg total) by mouth daily. 05/14/14   Lenda Kelp, MD  metoprolol succinate (TOPROL-XL) 25 MG 24 hr tablet  02/25/15   [provider]  pantoprazole (PROTONIX) 20 MG tablet Take 2 tablets (40 mg total) by mouth daily. 01/15/13   Charlynne Pander, MD  spironolactone (ALDACTONE) 25 MG tablet  03/04/15   [provider]    Allergies    Atorvastatin  Review of Systems   Review of Systems  Unable to perform ROS: Mental status change  Constitutional: Negative for fever.    Physical Exam Updated Vital Signs BP (!) 158/113   Pulse (!) 101   Temp 100.2 F (37.9 C) (Oral)   Resp (!) 31   SpO2 95%   Physical Exam Vitals and nursing note reviewed.  Constitutional:      Appearance: He is toxic-appearing and diaphoretic. He is not ill-appearing.  HENT:     Head: Normocephalic.     Mouth/Throat:     Mouth: Mucous membranes are dry.  Eyes:     Conjunctiva/sclera: Conjunctivae normal.  Cardiovascular:     Rate and Rhythm: Regular rhythm. Tachycardia present.  Pulmonary:     Effort: Pulmonary effort is normal.  Abdominal:     General: Abdomen is flat.     Palpations: Abdomen is soft.  Skin:    General:  Skin is warm.     Capillary Refill: Capillary refill takes 2 to 3 seconds.     Findings: No erythema.     Comments: Yeast appearing rash in bilateral groins  Neurological:     Mental Status: He is alert. He is disoriented.     Comments: Follows commands and can tell me his name but is unsure where he is or what year it is.  Psychiatric:        Mood and Affect: Mood normal.     ED Results / Procedures / Treatments   Labs (all labs ordered are listed, but only abnormal results are displayed) Labs Reviewed  COMPREHENSIVE METABOLIC PANEL - Abnormal; Notable for the following components:      Result Value   CO2 21 (*)    Glucose, Bld 121 (*)    Creatinine, Ser 1.40 (*)    Calcium 8.1 (*)    Total Protein 6.1  (*)    Albumin 2.8 (*)    AST 57 (*)    Total Bilirubin 2.0 (*)    GFR calc non Af Amer 50 (*)    GFR calc Af Amer 58 (*)    All other components within normal limits  CBC WITH DIFFERENTIAL/PLATELET - Abnormal; Notable for the following components:   Hemoglobin 17.1 (*)    Platelets 133 (*)    All other components within normal limits  CBG MONITORING, ED - Abnormal; Notable for the following components:   Glucose-Capillary 116 (*)    All other components within normal limits  CULTURE, BLOOD (ROUTINE X 2)  CULTURE, BLOOD (ROUTINE X 2)  LACTIC ACID, PLASMA  ETHANOL  LACTIC ACID, PLASMA  URINALYSIS, ROUTINE W REFLEX MICROSCOPIC  RAPID URINE DRUG SCREEN, HOSP PERFORMED  PROTIME-INR  POC SARS CORONAVIRUS 2 AG -  ED    EKG None  Radiology No results found.  Procedures Procedures (including critical care time)  Medications Ordered in ED Medications  sodium chloride 0.9 % bolus 1,000 mL (has no administration in time range)  acetaminophen (TYLENOL) suppository 650 mg (has no administration in time range)    ED Course  I have reviewed the triage vital signs and the nursing notes.  Pertinent labs & imaging results that were available during my care of the patient were reviewed by me and considered in my medical decision making (see chart for details).  Clinical Course as of Feb 28 912  Tue Feb 26, 2020  2346 Patient presenting with AMS, fever. Sepsis workup ordered and fluids, broad spectrum antibiotics ordered, unclear source at this time. Patient is hemodynamically stable.    [KM]    Clinical Course User Index [KM] Kristine Royal   MDM Rules/Calculators/A&P                       Final Clinical Impression(s) / ED Diagnoses Final diagnoses:  None    Rx / DC Orders ED Discharge Orders    None       Kristine Royal 02/28/20 1607    Lajean Saver, MD 02/28/20 1336

## 2020-02-26 NOTE — ED Provider Notes (Signed)
Received patient at signout from [facility].  Refer to provider note for full history and physical examination.  Briefly, patient is a 74 year old male with history of hypertension, hyperlipidemia, prior CVA presenting brought in by EMS for evaluation of altered mental status.  He was found wandering outside of a [place], confused.  There is concern that he may be homeless. He was found to be febrile and tachypneic here with SPO2 saturations 90% on room air and placed on supplemental oxygen.  He did endorse feeling short of breath with his RN but denies any complaints with me.  He is oriented to person only.  He tells me that he lives by himself but cannot tell me his home address.  Pending sepsis work-up, will likely require admission to the hospital.  He is already received IV fluids and broad-spectrum antibiotics.  Physical Exam  BP (!) 156/110   Pulse 95   Temp (!) 101.3 F (38.5 C) (Rectal)   Resp (!) 37   SpO2 96%   Physical Exam Vitals and nursing note reviewed.  Constitutional:      General: He is not in acute distress.    Appearance: He is well-developed.  HENT:     Head: Normocephalic and atraumatic.  Eyes:     General:        Right eye: No discharge.        Left eye: No discharge.     Conjunctiva/sclera: Conjunctivae normal.  Neck:     Vascular: No JVD.     Trachea: No tracheal deviation.  Cardiovascular:     Rate and Rhythm: Normal rate.  Pulmonary:     Comments: Tachypneic, speaking in shorter phrases, SPO2 saturations 96% on 2 L supplemental oxygen via nasal cannula Abdominal:     General: Bowel sounds are normal. There is no distension.     Palpations: Abdomen is soft.     Tenderness: There is no abdominal tenderness. There is no right CVA tenderness.  Musculoskeletal:     Cervical back: Neck supple.  Skin:    General: Skin is warm and dry.     Findings: No erythema.  Neurological:     Mental Status: He is alert.     Comments: Patient is slow to  answer questions, mildly confused.  Cranial nerves appear grossly intact.  Moves all extremities spontaneously without difficulty.  No pronator drift.  Sensation intact to light touch of face and extremities bilaterally.  Psychiatric:        Behavior: Behavior normal.     ED Course/Procedures   Clinical Course as of Feb 26 2356  Tue Feb 26, 2020  2346 Patient presenting with AMS, fever. Sepsis workup ordered and fluids, broad spectrum antibiotics ordered, unclear source at this time. Patient is hemodynamically stable.    [KM]    Clinical Course User Index [KM] Arlyn Dunning, PA-C    .Critical Care Performed by: Jeanie Sewer, PA-C Authorized by: Jeanie Sewer, PA-C   Critical care provider statement:    Critical care time (minutes):  45   Critical care was necessary to treat or prevent imminent or life-threatening deterioration of the following conditions:  Respiratory failure   Critical care was time spent personally by me on the following activities:  Discussions with consultants, evaluation of patient's response to treatment, examination of patient, ordering and performing treatments and interventions, ordering and review of laboratory studies, ordering and review of radiographic studies, pulse oximetry, re-evaluation of patient's condition, obtaining history  from patient or surrogate and review of old charts    MDM   Labs Reviewed  COMPREHENSIVE METABOLIC PANEL - Abnormal; Notable for the following components:      Result Value   CO2 21 (*)    Glucose, Bld 121 (*)    Creatinine, Ser 1.40 (*)    Calcium 8.1 (*)    Total Protein 6.1 (*)    Albumin 2.8 (*)    AST 57 (*)    Total Bilirubin 2.0 (*)    GFR calc non Af Amer 50 (*)    GFR calc Af Amer 58 (*)    All other components within normal limits  URINALYSIS, ROUTINE W REFLEX MICROSCOPIC - Abnormal; Notable for the following components:   Hgb urine dipstick MODERATE (*)    Protein, ur >=300 (*)    All other  components within normal limits  CBC WITH DIFFERENTIAL/PLATELET - Abnormal; Notable for the following components:   Hemoglobin 17.1 (*)    Platelets 133 (*)    All other components within normal limits  CBG MONITORING, ED - Abnormal; Notable for the following components:   Glucose-Capillary 116 (*)    All other components within normal limits  POC SARS CORONAVIRUS 2 AG -  ED - Abnormal; Notable for the following components:   SARS Coronavirus 2 Ag POSITIVE (*)    All other components within normal limits  CULTURE, BLOOD (ROUTINE X 2)  CULTURE, BLOOD (ROUTINE X 2)  LACTIC ACID, PLASMA  LACTIC ACID, PLASMA  RAPID URINE DRUG SCREEN, HOSP PERFORMED  ETHANOL  AMMONIA  PROTIME-INR  BLOOD GAS, ARTERIAL  HEMOGLOBIN A1C    CLINICAL DATA: Possible sepsis  EXAM: PORTABLE CHEST 1 VIEW  COMPARISON: None.  FINDINGS: Increased opacity in the medial right lung base. Small left pleural effusion. Mild cardiomegaly.  IMPRESSION: Increased opacity in the medial right lung base, which may indicate developing consolidation. Small left pleural effusion.   Electronically Signed By: Deatra Robinson M.D. On: 02/27/2020 00:25  CLINICAL DATA: Encephalopathy, weakness, COVID-19 positive  EXAM: CT HEAD WITHOUT CONTRAST  TECHNIQUE: Contiguous axial images were obtained from the base of the skull through the vertex without intravenous contrast.  COMPARISON: None.  FINDINGS: Brain: Confluent hypodensities throughout the periventricular white matter are most consistent with chronic small vessel ischemic changes. No other signs of acute infarct or hemorrhage. Lateral ventricles and remaining midline structures are unremarkable. No acute extra-axial fluid collections. No mass effect.  Vascular: No hyperdense vessel or unexpected calcification.  Skull: Normal. Negative for fracture or focal lesion.  Sinuses/Orbits: No acute finding.  Other: None.  IMPRESSION: 1. 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 2. No acute intracranial process.   Electronically Signed By: Sharlet Salina M.D. On: 02/27/2020 01:10   Irfan S Turan was evaluated in Emergency Department on 02/27/2020 for the symptoms described in the history of present illness. He was evaluated in the context of the global COVID-19 pandemic, which necessitated consideration that the patient might be at risk for infection with the SARS-CoV-2 virus that causes COVID-19. Institutional protocols and algorithms that pertain to the evaluation of patients at risk for COVID-19 are in a state of rapid change based on information released by regulatory bodies including the CDC and federal and state organizations. These policies and algorithms were followed during the patient's care in the ED.  Patient presenting for evaluation of altered mental status.  Found to be tachypneic, febrile, hypoxic with SPO2 saturations 90% on room air with  improvement on 2 L supplemental oxygen.  He is confused but has no focal neurologic deficits on examination.  Head CT shows no acute intracranial abnormalities.  He was found to be Covid positive.  I suspect this is likely causing his symptoms.  Spoke with Dr. Cyd Silence with [facility] hospitalist service who agrees to assume care of patient and bring him into the hospital for further evaluation and management.      Renita Papa, PA-C 02/27/20 0302    Merryl Hacker, MD 02/27/20 [phone]

## 2020-02-27 ENCOUNTER — Encounter (HOSPITAL_COMMUNITY): Payer: Self-pay | Admitting: Internal Medicine

## 2020-02-27 ENCOUNTER — Emergency Department (HOSPITAL_COMMUNITY): Payer: Medicare Other

## 2020-02-27 ENCOUNTER — Other Ambulatory Visit: Payer: Self-pay

## 2020-02-27 DIAGNOSIS — G9340 Encephalopathy, unspecified: Secondary | ICD-10-CM | POA: Diagnosis not present

## 2020-02-27 DIAGNOSIS — A4189 Other specified sepsis: Secondary | ICD-10-CM | POA: Diagnosis not present

## 2020-02-27 DIAGNOSIS — J9600 Acute respiratory failure, unspecified whether with hypoxia or hypercapnia: Secondary | ICD-10-CM | POA: Diagnosis not present

## 2020-02-27 DIAGNOSIS — E876 Hypokalemia: Secondary | ICD-10-CM | POA: Diagnosis present

## 2020-02-27 DIAGNOSIS — J1282 Pneumonia due to coronavirus disease 2019: Secondary | ICD-10-CM | POA: Diagnosis not present

## 2020-02-27 DIAGNOSIS — G510 Bell's palsy: Secondary | ICD-10-CM | POA: Diagnosis not present

## 2020-02-27 DIAGNOSIS — R531 Weakness: Secondary | ICD-10-CM | POA: Diagnosis not present

## 2020-02-27 DIAGNOSIS — I10 Essential (primary) hypertension: Secondary | ICD-10-CM

## 2020-02-27 DIAGNOSIS — Z791 Long term (current) use of non-steroidal anti-inflammatories (NSAID): Secondary | ICD-10-CM | POA: Diagnosis not present

## 2020-02-27 DIAGNOSIS — N179 Acute kidney failure, unspecified: Secondary | ICD-10-CM | POA: Diagnosis present

## 2020-02-27 DIAGNOSIS — E785 Hyperlipidemia, unspecified: Secondary | ICD-10-CM | POA: Diagnosis not present

## 2020-02-27 DIAGNOSIS — R1311 Dysphagia, oral phase: Secondary | ICD-10-CM | POA: Diagnosis not present

## 2020-02-27 DIAGNOSIS — K219 Gastro-esophageal reflux disease without esophagitis: Secondary | ICD-10-CM | POA: Diagnosis present

## 2020-02-27 DIAGNOSIS — Z7982 Long term (current) use of aspirin: Secondary | ICD-10-CM | POA: Diagnosis not present

## 2020-02-27 DIAGNOSIS — R41841 Cognitive communication deficit: Secondary | ICD-10-CM | POA: Diagnosis not present

## 2020-02-27 DIAGNOSIS — E041 Nontoxic single thyroid nodule: Secondary | ICD-10-CM | POA: Diagnosis not present

## 2020-02-27 DIAGNOSIS — R739 Hyperglycemia, unspecified: Secondary | ICD-10-CM | POA: Diagnosis present

## 2020-02-27 DIAGNOSIS — Z23 Encounter for immunization: Secondary | ICD-10-CM | POA: Diagnosis not present

## 2020-02-27 DIAGNOSIS — G9341 Metabolic encephalopathy: Secondary | ICD-10-CM | POA: Diagnosis present

## 2020-02-27 DIAGNOSIS — Z79899 Other long term (current) drug therapy: Secondary | ICD-10-CM | POA: Diagnosis not present

## 2020-02-27 DIAGNOSIS — R0902 Hypoxemia: Secondary | ICD-10-CM | POA: Diagnosis not present

## 2020-02-27 DIAGNOSIS — M109 Gout, unspecified: Secondary | ICD-10-CM | POA: Diagnosis present

## 2020-02-27 DIAGNOSIS — J189 Pneumonia, unspecified organism: Secondary | ICD-10-CM | POA: Diagnosis not present

## 2020-02-27 DIAGNOSIS — M2550 Pain in unspecified joint: Secondary | ICD-10-CM | POA: Diagnosis not present

## 2020-02-27 DIAGNOSIS — L89151 Pressure ulcer of sacral region, stage 1: Secondary | ICD-10-CM | POA: Diagnosis present

## 2020-02-27 DIAGNOSIS — Z7401 Bed confinement status: Secondary | ICD-10-CM | POA: Diagnosis not present

## 2020-02-27 DIAGNOSIS — U071 COVID-19: Secondary | ICD-10-CM | POA: Diagnosis not present

## 2020-02-27 DIAGNOSIS — J90 Pleural effusion, not elsewhere classified: Secondary | ICD-10-CM | POA: Diagnosis not present

## 2020-02-27 DIAGNOSIS — J9601 Acute respiratory failure with hypoxia: Secondary | ICD-10-CM

## 2020-02-27 DIAGNOSIS — M6281 Muscle weakness (generalized): Secondary | ICD-10-CM | POA: Diagnosis not present

## 2020-02-27 DIAGNOSIS — R42 Dizziness and giddiness: Secondary | ICD-10-CM | POA: Diagnosis not present

## 2020-02-27 DIAGNOSIS — I69398 Other sequelae of cerebral infarction: Secondary | ICD-10-CM | POA: Diagnosis not present

## 2020-02-27 DIAGNOSIS — F0390 Unspecified dementia without behavioral disturbance: Secondary | ICD-10-CM | POA: Diagnosis present

## 2020-02-27 DIAGNOSIS — M1710 Unilateral primary osteoarthritis, unspecified knee: Secondary | ICD-10-CM | POA: Diagnosis not present

## 2020-02-27 LAB — URINALYSIS, ROUTINE W REFLEX MICROSCOPIC
Bacteria, UA: NONE SEEN
Bilirubin Urine: NEGATIVE
Glucose, UA: NEGATIVE mg/dL
Ketones, ur: NEGATIVE mg/dL
Leukocytes,Ua: NEGATIVE
Nitrite: NEGATIVE
Protein, ur: >=300 mg/dL — AB
Specific Gravity, Urine: 1.018 (ref 1.005–1.030)
pH: 6 (ref 5.0–8.0)

## 2020-02-27 LAB — RAPID URINE DRUG SCREEN, HOSP PERFORMED
Amphetamines: NOT DETECTED
Barbiturates: NOT DETECTED
Benzodiazepines: NOT DETECTED
Cocaine: NOT DETECTED
Opiates: NOT DETECTED
Tetrahydrocannabinol: NOT DETECTED

## 2020-02-27 LAB — POC SARS CORONAVIRUS 2 AG -  ED: SARS Coronavirus 2 Ag: POSITIVE — AB

## 2020-02-27 LAB — AMMONIA: Ammonia: 30 umol/L (ref 9–35)

## 2020-02-27 LAB — LACTIC ACID, PLASMA: Lactic Acid, Venous: 1.4 mmol/L (ref 0.5–1.9)

## 2020-02-27 LAB — GLUCOSE, CAPILLARY
Glucose-Capillary: 130 mg/dL — ABNORMAL HIGH (ref 70–99)
Glucose-Capillary: 104 mg/dL — ABNORMAL HIGH (ref 70–99)
Glucose-Capillary: 128 mg/dL — ABNORMAL HIGH (ref 70–99)

## 2020-02-27 MED ORDER — SODIUM CHLORIDE 0.9 % IV SOLN
500.0000 mg | Freq: Every day | INTRAVENOUS | Status: DC
  Administered 2020-02-27 – 2020-02-28 (×2): 500 mg via INTRAVENOUS
  Filled 2020-02-27 (×2): qty 500

## 2020-02-27 MED ORDER — PANTOPRAZOLE SODIUM 40 MG PO TBEC
40.0000 mg | DELAYED_RELEASE_TABLET | Freq: Every day | ORAL | Status: DC
  Administered 2020-02-27 – 2020-03-04 (×7): 40 mg via ORAL
  Filled 2020-02-27 (×7): qty 1

## 2020-02-27 MED ORDER — LACTATED RINGERS IV SOLN: INTRAVENOUS | Status: AC

## 2020-02-27 MED ORDER — ALBUTEROL SULFATE HFA 108 (90 BASE) MCG/ACT IN AERS
2.0000 | INHALATION_SPRAY | RESPIRATORY_TRACT | Status: DC | PRN
  Filled 2020-02-27: qty 6.7

## 2020-02-27 MED ORDER — INSULIN ASPART 100 UNIT/ML ~~LOC~~ SOLN
0.0000 [IU] | Freq: Three times a day (TID) | SUBCUTANEOUS | Status: DC
  Administered 2020-02-27 – 2020-03-03 (×5): 2 [IU] via SUBCUTANEOUS
  Administered 2020-03-03: 3 [IU] via SUBCUTANEOUS
  Administered 2020-03-04: 2 [IU] via SUBCUTANEOUS

## 2020-02-27 MED ORDER — HYDROCOD POLST-CPM POLST ER 10-8 MG/5ML PO SUER: 5.0000 mL | Freq: Two times a day (BID) | ORAL | Status: DC | PRN

## 2020-02-27 MED ORDER — SODIUM CHLORIDE 0.9 % IV SOLN
1.0000 g | INTRAVENOUS | Status: DC
  Administered 2020-02-27 – 2020-02-28 (×2): 1 g via INTRAVENOUS
  Filled 2020-02-27 (×2): qty 10

## 2020-02-27 MED ORDER — ENOXAPARIN SODIUM 40 MG/0.4ML ~~LOC~~ SOLN
40.0000 mg | Freq: Every day | SUBCUTANEOUS | Status: DC
  Administered 2020-02-27 – 2020-03-04 (×7): 40 mg via SUBCUTANEOUS
  Filled 2020-02-27 (×7): qty 0.4

## 2020-02-27 MED ORDER — POLYETHYLENE GLYCOL 3350 17 G PO PACK: 17.0000 g | PACK | Freq: Every day | ORAL | Status: DC | PRN

## 2020-02-27 MED ORDER — ONDANSETRON HCL 4 MG/2ML IJ SOLN: 4.0000 mg | Freq: Four times a day (QID) | INTRAMUSCULAR | Status: DC | PRN

## 2020-02-27 MED ORDER — SODIUM CHLORIDE 0.9 % IV SOLN
100.0000 mg | Freq: Every day | INTRAVENOUS | Status: AC
  Administered 2020-02-28 – 2020-03-02 (×4): 100 mg via INTRAVENOUS
  Filled 2020-02-27 (×4): qty 20

## 2020-02-27 MED ORDER — ACETAMINOPHEN 325 MG PO TABS: 650.0000 mg | ORAL_TABLET | Freq: Four times a day (QID) | ORAL | Status: DC | PRN

## 2020-02-27 MED ORDER — HYDRALAZINE HCL 20 MG/ML IJ SOLN
10.0000 mg | Freq: Four times a day (QID) | INTRAMUSCULAR | Status: DC | PRN
  Administered 2020-03-01 – 2020-03-03 (×2): 10 mg via INTRAVENOUS
  Filled 2020-02-27 (×2): qty 1

## 2020-02-27 MED ORDER — DEXAMETHASONE SODIUM PHOSPHATE 10 MG/ML IJ SOLN
6.0000 mg | INTRAMUSCULAR | Status: DC
  Administered 2020-02-27 – 2020-03-02 (×5): 6 mg via INTRAVENOUS
  Filled 2020-02-27 (×5): qty 1

## 2020-02-27 MED ORDER — GUAIFENESIN-DM 100-10 MG/5ML PO SYRP: 10.0000 mL | ORAL_SOLUTION | ORAL | Status: DC | PRN

## 2020-02-27 MED ORDER — AMLODIPINE BESYLATE 5 MG PO TABS
5.0000 mg | ORAL_TABLET | Freq: Every day | ORAL | Status: DC
  Administered 2020-02-27 – 2020-03-04 (×7): 5 mg via ORAL
  Filled 2020-02-27 (×7): qty 1

## 2020-02-27 MED ORDER — ALBUTEROL SULFATE HFA 108 (90 BASE) MCG/ACT IN AERS
4.0000 | INHALATION_SPRAY | Freq: Once | RESPIRATORY_TRACT | Status: AC
  Administered 2020-02-27: 02:00:00 4 via RESPIRATORY_TRACT
  Filled 2020-02-27: qty 6.7

## 2020-02-27 MED ORDER — SODIUM CHLORIDE 0.9 % IV SOLN
100.0000 mg | INTRAVENOUS | Status: AC
  Administered 2020-02-27 (×2): 100 mg via INTRAVENOUS
  Filled 2020-02-27 (×2): qty 20

## 2020-02-27 MED ORDER — ONDANSETRON HCL 4 MG PO TABS: 4.0000 mg | ORAL_TABLET | Freq: Four times a day (QID) | ORAL | Status: DC | PRN

## 2020-02-27 MED ORDER — DEXAMETHASONE SODIUM PHOSPHATE 10 MG/ML IJ SOLN
10.0000 mg | Freq: Once | INTRAMUSCULAR | Status: AC
  Administered 2020-02-27: 10 mg via INTRAVENOUS
  Filled 2020-02-27: qty 1

## 2020-02-27 NOTE — ED Notes (Signed)
RN Marylene Land informed pt POC covid test pos

## 2020-02-27 NOTE — H&P (Signed)
History and Physical    Vincent Cummings [id number] DOB: [birth year] DOA: 02/26/2020  PCP: [name], MD  Patient coming from: brought in by [facility], found outside restaurant   Chief Complaint:  Chief Complaint  Patient presents with  . Altered Mental Status  . Weakness     HPI:    74 year old male with past medical history of hypertension, gout, gastroesophageal reflux disease who was brought into [facility] emergency department via EMS after being found wandering outside of a restaurant confused.  Patient is an extremely poor historian due to confusion with disorientation.  Per my discussion with the patient, he states he does not know why he is here.  Patient does not know the year.  He seems to know that he is in the hospital but is unable to provide further detail.  Patient denies shortness of breath, chest pain, weakness, headaches, sick contacts, change in taste, change in smell, recent travel or confirmed contact with COVID-19.  Upon evaluation in the emergency department, COVID-19 antigen testing came back positive.  Furthermore, initial chest x-ray reveals bibasilar patchy infiltrates concerning for atypical COVID-19 pneumonia.  She was also found to have multiple SIRS criteria including tachycardia and fever with Tmax of 101.3.  Patient was noted to have an oxygen saturation of 90% on arrival and therefore was placed on 2 L of oxygen via nasal cannula.  Due to patient's encephalopathy, SIRS, mild hypoxia and suspected COVID-19 infection the hospitalist group has been called to assess patient for admission to the hospital.     Review of Systems: A 10-system review of systems has been performed and all systems are negative with the exception of what is listed in the HPI.    Past Medical History:  Diagnosis Date  . Essential hypertension   . GERD without esophagitis   . Gout     History reviewed. No pertinent surgical history.   reports that he has  never smoked. He has never used smokeless tobacco. He reports that he does not drink alcohol. No history on file for drug.  Allergies  Allergen Reactions  . Atorvastatin Itching and Rash    History reviewed. No pertinent family history.   Prior to Admission medications   Medication Sig Start Date End Date Taking? Authorizing Provider  amLODipine-benazepril (LOTREL) 10-40 MG per capsule Take 1 capsule by mouth daily. 02/25/15   [provider]  aspirin 325 MG tablet Take 325 mg by mouth daily as needed for pain.    [provider]  colchicine 0.6 MG tablet  12/25/14   [provider]  famotidine (PEPCID) 20 MG tablet Take 1 tablet (20 mg total) by mouth 2 (two) times daily. 01/15/13   Drenda Freeze, MD  meloxicam (MOBIC) 15 MG tablet Take 1 tablet (15 mg total) by mouth daily. 05/14/14   Dene Gentry, MD  metoprolol succinate (TOPROL-XL) 25 MG 24 hr tablet  02/25/15   [provider]  pantoprazole (PROTONIX) 20 MG tablet Take 2 tablets (40 mg total) by mouth daily. 01/15/13   Drenda Freeze, MD  spironolactone (ALDACTONE) 25 MG tablet  03/04/15   [provider]    Physical Exam: Vitals:   02/27/20 0100 02/27/20 0145 02/27/20 0200 02/27/20 0215  BP: (!) 160/108 (!) 156/107 (!) 138/94 (!) 128/97  Pulse: 95 90 86 87  Resp: (!) 36     Temp:      TempSrc:      SpO2: 91% 94% 96%  95%    Constitutional: Lethargic but arousable and oriented x 1.  Patient is somewhat tachypneic    Skin: no rashes, no lesions, poor skin turgor noted. Eyes: Pupils are equally reactive to light.  No evidence of scleral icterus or conjunctival pallor.  ENMT: Slightly dry mucous membranes.. Posterior pharynx clear of any exudate or lesions. Normal dentition.   Neck: normal, supple, no masses, no thyromegaly Respiratory: Notable bibasilar rales.  No evidence of wheezing.  She is tachypneic without evidence of accessory muscle use.  Cardiovascular: Regular rate  and rhythm, no murmurs / rubs / gallops. No extremity edema. 2+ pedal pulses. No carotid bruits.  Back:   Nontender without crepitus or deformity. Abdomen: Abdomen is soft and nontender.  Abdomen is somewhat protuberant.  No evidence of intra-abdominal masses.  Positive bowel sounds noted in all quadrants.   Musculoskeletal: No joint deformity upper and lower extremities. Good ROM, no contractures. Normal muscle tone.  Neurologic: Patient is visibly confused and only oriented x1.  Able to follow commands.  CN 2-12 grossly intact. Sensation intact, strength noted to be 5 out of 5 in all 4 extremities.    Patient is responsive to verbal stimuli.   Psychiatric: Patient presents as a normal mood with appropriate affect.  Patient currently does not seem to possess insight as to his current situation  Labs on Admission: I have personally reviewed following labs and imaging studies -   CBC: Recent Labs  Lab 02/26/20 2234  WBC 5.8  NEUTROABS 3.9  HGB 17.1*  HCT 48.9  MCV 91.4  PLT 491*   Basic Metabolic Panel: Recent Labs  Lab 02/26/20 2234  NA 135  K 4.4  CL 103  CO2 21*  GLUCOSE 121*  BUN 15  CREATININE 1.40*  CALCIUM 8.1*   GFR: CrCl cannot be calculated (Unknown ideal weight.). Liver Function Tests: Recent Labs  Lab 02/26/20 2234  AST 57*  ALT 25  ALKPHOS 51  BILITOT 2.0*  PROT 6.1*  ALBUMIN 2.8*   No results for input(s): LIPASE, AMYLASE in the last 168 hours. Recent Labs  Lab 02/27/20 0023  AMMONIA 30   Coagulation Profile: No results for input(s): INR, PROTIME in the last 168 hours. Cardiac Enzymes: No results for input(s): CKTOTAL, CKMB, CKMBINDEX, TROPONINI in the last 168 hours. BNP (last 3 results) No results for input(s): PROBNP in the last 8760 hours. HbA1C: No results for input(s): HGBA1C in the last 72 hours. CBG: Recent Labs  Lab 02/26/20 2222  GLUCAP 116*   Lipid Profile: No results for input(s): CHOL, HDL, LDLCALC, TRIG, CHOLHDL, LDLDIRECT  in the last 72 hours. Thyroid Function Tests: No results for input(s): TSH, T4TOTAL, FREET4, T3FREE, THYROIDAB in the last 72 hours. Anemia Panel: No results for input(s): VITAMINB12, FOLATE, FERRITIN, TIBC, IRON, RETICCTPCT in the last 72 hours. Urine analysis:    Component Value Date/Time   COLORURINE YELLOW 02/27/2020 0022   APPEARANCEUR CLEAR 02/27/2020 0022   LABSPEC 1.018 02/27/2020 0022   PHURINE 6.0 02/27/2020 0022   GLUCOSEU NEGATIVE 02/27/2020 0022   HGBUR MODERATE (A) 02/27/2020 0022   BILIRUBINUR NEGATIVE 02/27/2020 0022   KETONESUR NEGATIVE 02/27/2020 0022   PROTEINUR >=300 (A) 02/27/2020 0022   NITRITE NEGATIVE 02/27/2020 0022   LEUKOCYTESUR NEGATIVE 02/27/2020 0022    Radiological Exams on Admission: No results found.  Telemetry: Personally reviewed.  Rhythm is normal sinus rhythm with heart rate of 95.  No dynamic ST segment changes appreciated.  Assessment/Plan Principal Problem:   Pneumonia  due to COVID-19 virus   Patient exhibits multiple SIRS criteria, evidence of organ dysfunction with metabolic encephalopathy and mild hypoxia with oxygen saturations of 90%.   COVID-19 antigen testing positive with patchy bibasilar traits noted on chest x-ray per my interpretation  Concern for early sepsis with secondary acute hypoxic respiratory failure and acute metabolic encephalopathy secondary to COVID-19  Placing patient on supplemental oxygen to keep oxygen saturations at 94% or higher  Initiating dexamethasone 6 mg IV every 24 hours  Initiating remdesivir  Bronchodilator therapy via albuterol MDI  We will temporarily place patient on intravenous ceftriaxone and azithromycin.  Obtaining procalcitonin and if this is normal will discontinue antibacterial therapy.  We will admit to Covid unit  Contact and droplet isolation.    Active Problems:   Acute metabolic encephalopathy   Patient presenting with disorientation and lethargy  Patient's baseline  mentation unclear  No family contacts listed on facesheet with patient unwilling to provide any family contact formation  Suspected encephalopathy thought to be secondary to underlying COVID-19 infection  Additionally obtaining hepatic function panel, TSH and vitamin B12  Hydrating gently with intravenous isotonic fluids  Treating underlying COVID-19 infection  Providing patient with supplemental oxygen  Monitoring for symptomatic improvement    Essential hypertension   Per review of older records, patient has been on multiple antihypertensives in the past (2016)  We will initiate a modest regimen of amlodipine 5 mg by mouth once daily tomorrow  We will provide patient with additional intravenous antihypertensives for excessively elevated blood pressures    Hyperglycemia   Notable mild hyperglycemia on arrival  No history of diabetes mellitus  Obtaining hemoglobin A1c  Ordering Accu-Cheks before every meal and nightly  Anticipated persisting hyperglycemia secondary to initiation of systemic steroids    GERD without esophagitis   Initiating Protonix 40 mg daily    Code Status:  Full code Family Communication: Patient unwilling to provide family contact information Disposition Plan: Patient is anticipated to be discharged to skilled nursing facility once patient has met maximum benefit from current hospitalization.   Consults called: None  Admission status: Patient will be admitted to Inpatient and is anticipated to remain in the hospital for greater than 2 midnights.   Vernelle Emerald MD [facility] Pager [phone]  If 7PM-7AM, please contact night-coverage [email or web address] Use universal [place] password for that web site. If you do not have the password, please call the hospital operator.  02/27/2020, 3:01 AM

## 2020-02-27 NOTE — Progress Notes (Signed)
CSW completed web search for family members of the patient and found a spouse, Byrd Hesselbach, and another person named [name]. CSW left HIPPA compliant voicemail at [phone] to see if either party is connected to the patient (number [phone] is not in service).  Clytie Shetley LCSW

## 2020-02-27 NOTE — Progress Notes (Signed)
Patient refused ABG 

## 2020-02-27 NOTE — Progress Notes (Signed)
Patient continues to refuse lab works and blood drawn from staff, patient stated " Im Jehovah's Witness, " MD. Jerral Ralph was notify and is aware of patient refuse of blood drawn. At this time patient has a Soil scientist due to confusion and safety precautions with attempts to pull out his IV site.

## 2020-02-27 NOTE — Progress Notes (Addendum)
PROGRESS NOTE                                                                                                                                                                                                             Patient Demographics:    Vincent Cummings, is a 74 y.o. male, DOB - [birth year], ZOX:[id number]  Outpatient Primary MD for the patient is [name], MD   Admit date - 02/26/2020   LOS - 0  Chief Complaint  Patient presents with  . Altered Mental Status  . Weakness       Brief Narrative: Patient is a 74 y.o. male with PMHx of HTN, HLD-who was found confused and wandering outside of a local [place] restaurant-subsequently brought to the ED prior, hypoxic-further evaluation revealed COVID-19 pneumonia.  See below for further details.  Significant Events: 4/13>> brought to ED by EMS for weakness, confusion-found wandering outside a local [employer] since 4/11.  COVID-19 medications: Steroids:4/13>> Remdesivir: 4/13>>  Antibiotics: Zithromax: 4/13>> Rocephin: 4/14>> Vancomycin: 4/13 x 1 Zosyn: 4/13 x 1  Microbiology data: 4/13>> blood culture: No growth  DVT prophylaxis: SQ Lovenox  Procedures: None  Consults: None    Subjective:    Vincent Cummings today is still confused.  He thinks he is 74 years old-does not know that he is in the hospital.  However is able to answer some other questions appropriately.  When asked if he has any family-he tells me that his son is in the [employer].  When asked if he has a spouse-only mumbled something that was incomprehensible.  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   Assessment  & Plan :   Acute Hypoxic Resp Failure due to Covid 19 Viral pneumonia: No oxygen requirement this morning-apparently was hypoxic when he first presented to the hospital.  Do not think patient had sepsis physiology  on admission.  Plans are to continue steroids and remdesivir-follow inflammatory markers (if patient allows lab draws).  Due to concern for concurrent bacterial pneumonia (although doubt)-he remains on empiric Rocephin/Zithromax till we obtain a procalcitonin level.  Fever: afebrile  O2 requirements:  SpO2: 93 %   COVID-19 Labs: No results for input(s): DDIMER, FERRITIN, LDH, CRP in the last 72 hours.  No results found for: BNP  No results for input(s):  PROCALCITON in the last 168 hours.  No results found for: SARSCOV2NAA   Prone/Incentive Spirometry: encouraged  incentive spirometry use 3-4/hour.  Likely secondary to acute metabolic encephalopathy: Remains confused-not sure if patient has some amount of cognitive dysfunction/dementia at baseline.  Continue treatment with steroids/remdesivir/empiric antimicrobial therapy to assess if this is infact metabolic encephalopathy in the setting of COVID-19 pneumonia.  CT head negative for acute abnormalities on admission.  No meningeal signs present.  If confusion persists in spite of treatment of underlying COVID-19 infection-may need further work-up/information from family/friends.  AKI: Suspect this is hemodynamically mediated-follow electrolytes if patient allows lab draws.  HTN: Blood pressure relatively stable-continue amlodipine.  Follow and adjust.  GERD: Continue PPI  Other issues: Remains confused-no family member listed in facesheet.  Have sought assistance from social worker to see if he can get in touch with family members/friends.  RN pressure injury documentation: Pressure Injury 02/27/20 Coccyx Stage 1 -  Intact skin with non-blanchable redness of a localized area usually over a bony prominence. (Active)  02/27/20 0500  Location: Coccyx  Location Orientation:   Staging: Stage 1 -  Intact skin with non-blanchable redness of a localized area usually over a bony prominence.  Wound Description (Comments):   Present on  Admission: Yes    ABG: No results found for: PHART, PCO2ART, PO2ART, HCO3, TCO2, ACIDBASEDEF, O2SAT  Vent Settings: N/  Condition -  Guarded  Family Communication  : None listed in facesheet-see above  Code Status :  Full Code  Diet :  Diet Order            Diet Heart Room service appropriate? Yes; Fluid consistency: Thin  Diet effective now               Disposition Plan  :  Remain hospitalized  Barriers to discharge: Hypoxia requiring O2 supplementation/complete 5 days of IV Remdesivir  Antimicorbials  :    Anti-infectives (From admission, onward)   Start     Dose/Rate Route Frequency Ordered Stop   02/28/20 1000  remdesivir 100 mg in sodium chloride 0.9 % 100 mL IVPB     100 mg 200 mL/hr over 30 Minutes Intravenous Daily 02/27/20 0244 03/03/20 0959   02/27/20 0800  cefTRIAXone (ROCEPHIN) 1 g in sodium chloride 0.9 % 100 mL IVPB     1 g 200 mL/hr over 30 Minutes Intravenous Every 24 hours 02/27/20 0234     02/27/20 0600  azithromycin (ZITHROMAX) 500 mg in sodium chloride 0.9 % 250 mL IVPB     500 mg 250 mL/hr over 60 Minutes Intravenous Daily 02/27/20 0234     02/27/20 0330  remdesivir 100 mg in sodium chloride 0.9 % 100 mL IVPB     100 mg 200 mL/hr over 30 Minutes Intravenous Every 30 min 02/27/20 0244 02/27/20 0520   02/27/20 0000  piperacillin-tazobactam (ZOSYN) IVPB 3.375 g     3.375 g 100 mL/hr over 30 Minutes Intravenous  Once 02/26/20 2345 02/27/20 0124   02/27/20 0000  vancomycin (VANCOCIN) IVPB 1000 mg/200 mL premix     1,000 mg 200 mL/hr over 60 Minutes Intravenous  Once 02/26/20 2345 02/27/20 0200      Inpatient Medications  Scheduled Meds: . amLODipine  5 mg Oral Daily  . clotrimazole   Topical BID  . dexamethasone (DECADRON) injection  6 mg Intravenous Q24H  . enoxaparin (LOVENOX) injection  40 mg Subcutaneous Daily  . insulin aspart  0-15 Units Subcutaneous TID AC & HS  .  pantoprazole  40 mg Oral Daily   Continuous Infusions: .  azithromycin 500 mg (02/27/20 0553)  . cefTRIAXone (ROCEPHIN)  IV 1 g (02/27/20 0801)  . lactated ringers 100 mL/hr at 02/27/20 0453  . [START ON 02/28/2020] remdesivir 100 mg in NS 100 mL     PRN Meds:.acetaminophen, albuterol, chlorpheniramine-HYDROcodone, guaiFENesin-dextromethorphan, hydrALAZINE, ondansetron **OR** ondansetron (ZOFRAN) IV, polyethylene glycol   Time Spent in minutes  25  See all Orders from today for further details   Oren Binet M.D on 02/27/2020 at 1:57 PM  To page go to [email or web address] - use universal password  [facility] -  Office  [phone]    Objective:   Vitals:   02/27/20 0456 02/27/20 0500 02/27/20 0506 02/27/20 1021  BP:  (!) 157/115 (!) 159/105 (!) 156/106  Pulse:  89 90 90  Resp:    20  Temp: 98.9 F (37.2 C)   97.8 F (36.6 C)  TempSrc: Oral     SpO2:  95% 96% 93%  Weight:   79 kg   Height:   5\' 8"  (1.727 m)     Wt Readings from Last 3 Encounters:  02/27/20 79 kg  04/30/15 89.8 kg  03/19/15 90.3 kg     Intake/Output Summary (Last 24 hours) at 02/27/2020 1357 Last data filed at 02/27/2020 0600 Gross per 24 hour  Intake 1229.71 ml  Output --  Net 1229.71 ml     Physical Exam Gen Exam: Confused-not in any distress HEENT:atraumatic, normocephalic Chest: B/L clear to auscultation anteriorly CVS:S1S2 regular Abdomen:soft non tender, non distended Extremities:no edema Neurology: Moving all 4 extremities. Skin: no rash   Data Review:    CBC Recent Labs  Lab 02/26/20 2234  WBC 5.8  HGB 17.1*  HCT 48.9  PLT 133*  MCV 91.4  MCH 32.0  MCHC 35.0  RDW 14.3  LYMPHSABS 1.2  MONOABS 0.7  EOSABS 0.0  BASOSABS 0.0    Chemistries  Recent Labs  Lab 02/26/20 2234  NA 135  K 4.4  CL 103  CO2 21*  GLUCOSE 121*  BUN 15  CREATININE 1.40*  CALCIUM 8.1*  AST 57*  ALT 25  ALKPHOS 51  BILITOT 2.0*    ------------------------------------------------------------------------------------------------------------------ No results for input(s): CHOL, HDL, LDLCALC, TRIG, CHOLHDL, LDLDIRECT in the last 72 hours.  No results found for: HGBA1C ------------------------------------------------------------------------------------------------------------------ No results for input(s): TSH, T4TOTAL, T3FREE, THYROIDAB in the last 72 hours.  Invalid input(s): FREET3 ------------------------------------------------------------------------------------------------------------------ No results for input(s): VITAMINB12, FOLATE, FERRITIN, TIBC, IRON, RETICCTPCT in the last 72 hours.  Coagulation profile No results for input(s): INR, PROTIME in the last 168 hours.  No results for input(s): DDIMER in the last 72 hours.  Cardiac Enzymes No results for input(s): CKMB, TROPONINI, MYOGLOBIN in the last 168 hours.  Invalid input(s): CK ------------------------------------------------------------------------------------------------------------------ No results found for: BNP  Micro Results Recent Results (from the past 240 hour(s))  Blood culture (routine x 2)     Status: None (Preliminary result)   Collection Time: 02/26/20 11:28 PM   Specimen: BLOOD  Result Value Ref Range Status   Specimen Description BLOOD RIGHT ARM  Final   Special Requests   Final    BOTTLES DRAWN AEROBIC AND ANAEROBIC Blood Culture adequate volume Performed at [facility], [place] [address], [place], [place] [address]    Culture NO GROWTH < 12 HOURS  Final   Report Status PENDING  Incomplete  Blood culture (routine x 2)     Status: None (Preliminary result)  Collection Time: 02/26/20 11:39 PM   Specimen: BLOOD  Result Value Ref Range Status   Specimen Description BLOOD LEFT ARM  Final   Special Requests   Final    BOTTLES DRAWN AEROBIC AND ANAEROBIC Blood Culture adequate volume Performed at [facility],  [address] [place], [place] [address]    Culture NO GROWTH < 12 HOURS  Final   Report Status PENDING  Incomplete    Radiology Reports CT Head Wo Contrast  Result Date: 02/27/2020 CLINICAL DATA:  Encephalopathy, weakness, COVID-19 positive EXAM: CT HEAD WITHOUT CONTRAST TECHNIQUE: Contiguous axial images were obtained from the base of the skull through the vertex without intravenous contrast. COMPARISON:  None. FINDINGS: Brain: Confluent hypodensities throughout the periventricular white matter are most consistent with chronic small vessel ischemic changes. No other signs of acute infarct or hemorrhage. Lateral ventricles and remaining midline structures are unremarkable. No acute extra-axial fluid collections. No mass effect. Vascular: No hyperdense vessel or unexpected calcification. Skull: Normal. Negative for fracture or focal lesion. Sinuses/Orbits: No acute finding. Other: None. IMPRESSION: 1. 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 2. No acute intracranial process. Electronically Signed   By: Sharlet Salina M.D.   On: 02/27/2020 01:10   DG Chest Port 1 View  Result Date: 02/27/2020 CLINICAL DATA:  Possible sepsis EXAM: PORTABLE CHEST 1 VIEW COMPARISON:  None. FINDINGS: Increased opacity in the medial right lung base. Small left pleural effusion. Mild cardiomegaly. IMPRESSION: Increased opacity in the medial right lung base, which may indicate developing consolidation. Small left pleural effusion. Electronically Signed   By: Deatra Robinson M.D.   On: 02/27/2020 00:25

## 2020-02-27 NOTE — ED Notes (Signed)
Pt refusing blood gas at this time

## 2020-02-28 LAB — CBC WITH DIFFERENTIAL/PLATELET
Abs Immature Granulocytes: 0.05 10*3/uL (ref 0.00–0.07)
Basophils Absolute: 0 10*3/uL (ref 0.0–0.1)
Basophils Relative: 0 %
Eosinophils Absolute: 0 10*3/uL (ref 0.0–0.5)
Eosinophils Relative: 0 %
HCT: 47.4 % (ref 39.0–52.0)
Hemoglobin: 16.2 g/dL (ref 13.0–17.0)
Immature Granulocytes: 1 %
Lymphocytes Relative: 22 %
Lymphs Abs: 1.6 10*3/uL (ref 0.7–4.0)
MCH: 31.3 pg (ref 26.0–34.0)
MCHC: 34.2 g/dL (ref 30.0–36.0)
MCV: 91.7 fL (ref 80.0–100.0)
Monocytes Absolute: 1.1 10*3/uL — ABNORMAL HIGH (ref 0.1–1.0)
Monocytes Relative: 15 %
Neutro Abs: 4.4 10*3/uL (ref 1.7–7.7)
Neutrophils Relative %: 62 %
Platelets: 182 10*3/uL (ref 150–400)
RBC: 5.17 MIL/uL (ref 4.22–5.81)
RDW: 13.7 % (ref 11.5–15.5)
WBC: 7.2 10*3/uL (ref 4.0–10.5)
nRBC: 0 % (ref 0.0–0.2)

## 2020-02-28 LAB — COMPREHENSIVE METABOLIC PANEL
ALT: 20 U/L (ref 0–44)
AST: 28 U/L (ref 15–41)
Albumin: 2.4 g/dL — ABNORMAL LOW (ref 3.5–5.0)
Alkaline Phosphatase: 48 U/L (ref 38–126)
Anion gap: 10 (ref 5–15)
BUN: 18 mg/dL (ref 8–23)
CO2: 22 mmol/L (ref 22–32)
Calcium: 8.3 mg/dL — ABNORMAL LOW (ref 8.9–10.3)
Chloride: 105 mmol/L (ref 98–111)
Creatinine, Ser: 1.12 mg/dL (ref 0.61–1.24)
GFR calc Af Amer: >60 mL/min (ref >60)
GFR calc non Af Amer: >60 mL/min (ref >60)
Glucose, Bld: 107 mg/dL — ABNORMAL HIGH (ref 70–99)
Potassium: 3.3 mmol/L — ABNORMAL LOW (ref 3.5–5.1)
Sodium: 137 mmol/L (ref 135–145)
Total Bilirubin: 1.1 mg/dL (ref 0.3–1.2)
Total Protein: 5.4 g/dL — ABNORMAL LOW (ref 6.5–8.1)

## 2020-02-28 LAB — HEMOGLOBIN A1C
Hgb A1c MFr Bld: 5.6 % (ref 4.8–5.6)
Mean Plasma Glucose: 114 mg/dL

## 2020-02-28 LAB — D-DIMER, QUANTITATIVE: D-Dimer, Quant: 0.79 ug/mL-FEU — ABNORMAL HIGH (ref 0.00–0.50)

## 2020-02-28 LAB — PROCALCITONIN: Procalcitonin: <0.1 ng/mL

## 2020-02-28 LAB — GLUCOSE, CAPILLARY
Glucose-Capillary: 94 mg/dL (ref 70–99)
Glucose-Capillary: 116 mg/dL — ABNORMAL HIGH (ref 70–99)
Glucose-Capillary: 117 mg/dL — ABNORMAL HIGH (ref 70–99)
Glucose-Capillary: 123 mg/dL — ABNORMAL HIGH (ref 70–99)

## 2020-02-28 LAB — C-REACTIVE PROTEIN: CRP: 4.2 mg/dL — ABNORMAL HIGH (ref <1.0)

## 2020-02-28 LAB — FERRITIN: Ferritin: 862 ng/mL — ABNORMAL HIGH (ref 24–336)

## 2020-02-28 MED ORDER — POTASSIUM CHLORIDE CRYS ER 20 MEQ PO TBCR
40.0000 meq | EXTENDED_RELEASE_TABLET | Freq: Once | ORAL | Status: AC
  Administered 2020-02-28: 08:00:00 40 meq via ORAL
  Filled 2020-02-28: qty 2

## 2020-02-28 NOTE — Evaluation (Signed)
Physical Therapy Evaluation Patient Details Name: Vincent Cummings MRN: [id number] DOB: 07-20-1946 Today's Date: 02/28/2020   History of Present Illness  74 y.o. male with PMHx of HTN, HLD-who was found confused and wandering outside of a local [employer] restaurant-subsequently brought to the ED 02/26/20 -further evaluation revealed COVID-19 pneumonia.  Clinical Impression   Pt admitted with above diagnosis. Patient remains confused with poor insight into his deficits. He states he has a son that will stay with him (per chart this does not appear to be accurate).  Patient required min assist for balance with sit to stand and walking short distance. At rest, O2 sats 98% on RA and with exertion 89-90% on RA. Pt currently with functional limitations due to the deficits listed below (see PT Problem List). Pt will benefit from skilled PT to increase their independence and safety with mobility to allow discharge to the venue listed below.       Follow Up Recommendations SNF (doubtful patient will agree; ?if he is competent to refuse)    Equipment Recommendations  Rolling walker with 5" wheels    Recommendations for Other Services OT consult     Precautions / Restrictions Precautions Precautions: Fall      Mobility  Bed Mobility Overal bed mobility: Needs Assistance Bed Mobility: Supine to Sit;Sit to Supine     Supine to sit: Supervision Sit to supine: Supervision   General bed mobility comments: incr time with HOB elevated; pt with difficulty processing how to lie down in the bed from sitting on the EOB (began to lie across the bed instead of head on his pillow)  Transfers Overall transfer level: Needs assistance Equipment used: None Transfers: Sit to/from Stand Sit to Stand: Min assist         General transfer comment: reaching for UE support as coming to stand; stays in forward flexed posture with wide BOS  Ambulation/Gait Ambulation/Gait assistance: Min assist Gait  Distance (Feet): 12 Feet(x2) Assistive device: 1 person hand held assist;Rolling walker (2 wheeled) Gait Pattern/deviations: Step-to pattern;Decreased stride length;Trunk flexed;Wide base of support     General Gait Details: reaching and holding onto objects initial 12 ft with imbalance to his right requiring min assist; from bathroom to bed he begrudgingly agreed to use RW (which he pushed too far ahead)  Science writer    Modified Rankin (Stroke Patients Only)       Balance Overall balance assessment: Needs assistance Sitting-balance support: No upper extremity supported;Feet supported Sitting balance-Leahy Scale: Fair     Standing balance support: Bilateral upper extremity supported Standing balance-Leahy Scale: Poor                               Pertinent Vitals/Pain Pain Assessment: No/denies pain    Home Living Family/patient expects to be discharged to:: Private residence Living Arrangements: Alone Available Help at Discharge: Available PRN/intermittently;Friend(s)(girlfriend (per pt--?reliable)) Type of Home: House Home Access: Stairs to enter Entrance Stairs-Rails: Psychiatric nurse of Steps: 10 Home Layout: One level Home Equipment: Grab bars - tub/shower Additional Comments: all information per pt (with noted decr cognition); per LCSW note pt has no family in area     Prior Function Level of Independence: Independent         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, newspaper        Extremity/Trunk  Assessment   Upper Extremity Assessment Upper Extremity Assessment: Defer to OT evaluation    Lower Extremity Assessment Lower Extremity Assessment: Generalized weakness       Communication   Communication: No difficulties  Cognition Arousal/Alertness: Awake/alert Behavior During Therapy: WFL for tasks assessed/performed Overall Cognitive Status: No family/caregiver present to  determine baseline cognitive functioning Area of Impairment: Safety/judgement;Awareness;Problem solving                         Safety/Judgement: Decreased awareness of safety;Decreased awareness of deficits Awareness: Intellectual Problem Solving: Slow processing;Difficulty sequencing;Requires verbal cues;Requires tactile cues General Comments: Patient reaching to hold onto counter and door as walking to bathroom, yet denied need for RW or any assistance on discharge; pt unaware he missed the toilet when standing to urinate in toilet; assisted patient to sit down; after having BM, pt requested washcloth to clean himself, he was unaware that he allowed washcloth to hang down into the toilet water when he reached from the front to clean himself and blamed the therapist for giving him a washcloth that was "dripping wet and needed to wring it out more"      General Comments General comments (skin integrity, edema, etc.): After requiring assist for balance while walking, pt still felt he was able to go home with intermittent assist. He reports his son would come help (per LCSW note he has been estranged from son since 2014-per son)    Exercises     Assessment/Plan    PT Assessment Patient needs continued PT services  PT Problem List Decreased strength;Decreased activity tolerance;Decreased balance;Decreased mobility;Decreased cognition;Decreased knowledge of use of DME;Decreased safety awareness;Decreased knowledge of precautions;Obesity       PT Treatment Interventions DME instruction;Gait training;Functional mobility training;Therapeutic activities;Therapeutic exercise;Balance training;Cognitive remediation;Patient/family education    PT Goals (Current goals can be found in the Care Plan section)  Acute Rehab PT Goals Patient Stated Goal: to go home without using RW PT Goal Formulation: With patient Time For Goal Achievement: 03/13/20 Potential to Achieve Goals: Good     Frequency Min 2X/week   Barriers to discharge Decreased caregiver support      Co-evaluation               AM-PAC PT "6 Clicks" Mobility  Outcome Measure Help needed turning from your back to your side while in a flat bed without using bedrails?: A Little Help needed moving from lying on your back to sitting on the side of a flat bed without using bedrails?: A Little Help needed moving to and from a bed to a chair (including a wheelchair)?: A Little Help needed standing up from a chair using your arms (e.g., wheelchair or bedside chair)?: A Little Help needed to walk in hospital room?: A Little Help needed climbing 3-5 steps with a railing? : A Lot 6 Click Score: 17    End of Session Equipment Utilized During Treatment: Gait belt Activity Tolerance: Patient limited by fatigue Patient left: in bed;with call bell/phone within reach;with bed alarm set Nurse Communication: Mobility status PT Visit Diagnosis: Unsteadiness on feet (R26.81);Difficulty in walking, not elsewhere classified (R26.2)    Time: 6270-3500 PT Time Calculation (min) (ACUTE ONLY): 35 min   Charges:   PT Evaluation $PT Eval Low Complexity: 1 Low PT Treatments $Gait Training: 8-22 mins         Jerolyn Center, PT Pager [phone]   Zena Amos 02/28/2020, 6:22 PM

## 2020-02-28 NOTE — Progress Notes (Addendum)
PROGRESS NOTE                                                                                                                                                                                                             Patient Demographics:    Vincent Cummings, is a 74 y.o. male, DOB - Jul 29, 1946, [id number]  Outpatient Primary MD for the patient is [name], MD   Admit date - 02/26/2020   LOS - 1  Chief Complaint  Patient presents with  . Altered Mental Status  . Weakness       Brief Narrative: Patient is a 74 y.o. male with PMHx of HTN, HLD-who was found confused and wandering outside of a local [place] restaurant-subsequently brought to the ED -further evaluation revealed COVID-19 pneumonia.  See below for further details.  Significant Events: 4/13>> brought to ED by EMS for weakness, confusion-found wandering outside a local [employer] since 4/11.  COVID-19 medications: Steroids:4/13>> Remdesivir: 4/13>>  Antibiotics: Zithromax: 4/13>>4/15 Rocephin: 4/14>>4/15 Vancomycin: 4/13 x 1 Zosyn: 4/13 x 1  Microbiology data: 4/13>> blood culture: No growth  DVT prophylaxis: SQ Lovenox  Procedures: None  Consults: None    Subjective:   He is less confused compared to yesterday-appears comfortable-denies any chest pain or shortness of breath.   Still not able to tell me his right age (claimed he was 53)-knew he was in the hospital but did not know that he was at [facility] health.  Unable to give me his home address-unable to give me his sons telephone number.  Tells me that his son is a Charity fundraiser and that he graduated from [employer].  After a lot of prodding-able to tell me that Verdie Drown is the president.  Tried explaining to him that he has COVID-19 pneumonia-asked him if he has heard of Covid/coronavirus-he said no.  He is easily distractible-and requires quite a bit of redirection-before he is able to  answer some questions appropriately.   Assessment  & Plan :   Acute Hypoxic Resp Failure due to Covid 19 Viral pneumonia: Initially hypoxic when he first presented to the hospital-no oxygen requirements for the past few days.  Do not think patient has sepsis physiology.  Continue steroids/remdesivir-since procalcitonin levels not elevated-and due to low suspicion for bacterial pneumonia-stop Rocephin/Zithromax.  Follow clinical trajectory/inflammatory markers.    Fever:  afebrile  O2 requirements:  SpO2: 91 % O2 Flow Rate (L/min): 2 L/min   COVID-19 Labs: Recent Labs    02/28/20 0339  DDIMER 0.79*  FERRITIN 862*  CRP 4.2*    No results found for: BNP  Recent Labs  Lab 02/28/20 0339  PROCALCITON <0.10    No results found for: SARSCOV2NAA   Prone/Incentive Spirometry: encouraged  incentive spirometry use 3-4/hour.  Likely secondary to acute metabolic encephalopathy superimposed on dementia/chronic paranoia: Confusion has somewhat improved compared to yesterday- although he is still not able to answer some simple questions-see above.  Not sure if he is back to his usual baseline-suspect that he may have some underlying dementia/psych disorder at baseline.  May have had superimposed mild metabolic encephalopathy due to COVID-19 pneumonia.    Finally able to reach patient's son [name] (in the [employer]-currently in [place], [place] has not seen his father since 2014.  Per patient's son-patient has had longstanding issues with chronic paranoia-has lived a hermetic lifestyle-and is very religious.Son suspects a underlying psychiatric disorder-and may have even developed dementia.  Per patient's son-patient has some cousins in [place], [place] that he is not in contact with-but will try and get their contact information for more collaborative history.  In the meantime-we will check TSH, vitamin B12, RPR, HIV.  CT head on admission did not show any acute abnormalities.  We will also go  ahead and get a psychiatric evaluation.I have reached out to the social worker-at this patient is not safe to be discharged back home in his current mental situation.  (Per patient-his son is a not RN-but used to work as a Paramedic a long time back, however he did graduate from [employer] but is now in the [place] [employer])  AKI: Suspect this is hemodynamically mediated-resolved  HTN: Blood pressure relatively stable-continue amlodipine.  Follow and adjust.  GERD: Continue PPI   RN pressure injury documentation: Pressure Injury 02/27/20 Coccyx Stage 1 -  Intact skin with non-blanchable redness of a localized area usually over a bony prominence. (Active)  02/27/20 0500  Location: Coccyx  Location Orientation:   Staging: Stage 1 -  Intact skin with non-blanchable redness of a localized area usually over a bony prominence.  Wound Description (Comments):   Present on Admission: Yes    ABG: No results found for: PHART, PCO2ART, PO2ART, HCO3, TCO2, ACIDBASEDEF, O2SAT  Vent Settings: N/  Condition -  Guarded  Family Communication  : Son over the phone 4/15  Code Status :  Full Code  Diet :  Diet Order            Diet Heart Room service appropriate? Yes; Fluid consistency: Thin  Diet effective now               Disposition Plan  :  Remain hospitalized  Barriers to discharge: Hypoxia requiring O2 supplementation/complete 5 days of IV Remdesivir  Antimicorbials  :    Anti-infectives (From admission, onward)   Start     Dose/Rate Route Frequency Ordered Stop   02/28/20 1000  remdesivir 100 mg in sodium chloride 0.9 % 100 mL IVPB     100 mg 200 mL/hr over 30 Minutes Intravenous Daily 02/27/20 0244 03/03/20 0959   02/27/20 0800  cefTRIAXone (ROCEPHIN) 1 g in sodium chloride 0.9 % 100 mL IVPB     1 g 200 mL/hr over 30 Minutes Intravenous Every 24 hours 02/27/20 0234     02/27/20 0600  azithromycin (ZITHROMAX) 500 mg in sodium  chloride 0.9 % 250 mL IVPB     500 mg 250 mL/hr over 60  Minutes Intravenous Daily 02/27/20 0234     02/27/20 0330  remdesivir 100 mg in sodium chloride 0.9 % 100 mL IVPB     100 mg 200 mL/hr over 30 Minutes Intravenous Every 30 min 02/27/20 0244 02/27/20 0520   02/27/20 0000  piperacillin-tazobactam (ZOSYN) IVPB 3.375 g     3.375 g 100 mL/hr over 30 Minutes Intravenous  Once 02/26/20 2345 02/27/20 0124   02/27/20 0000  vancomycin (VANCOCIN) IVPB 1000 mg/200 mL premix     1,000 mg 200 mL/hr over 60 Minutes Intravenous  Once 02/26/20 2345 02/27/20 0200      Inpatient Medications  Scheduled Meds: . amLODipine  5 mg Oral Daily  . clotrimazole   Topical BID  . dexamethasone (DECADRON) injection  6 mg Intravenous Q24H  . enoxaparin (LOVENOX) injection  40 mg Subcutaneous Daily  . insulin aspart  0-15 Units Subcutaneous TID AC & HS  . pantoprazole  40 mg Oral Daily   Continuous Infusions: . azithromycin 500 mg (02/28/20 1132)  . cefTRIAXone (ROCEPHIN)  IV 1 g (02/28/20 0805)  . remdesivir 100 mg in NS 100 mL 100 mg (02/28/20 1042)   PRN Meds:.acetaminophen, albuterol, chlorpheniramine-HYDROcodone, guaiFENesin-dextromethorphan, hydrALAZINE, ondansetron **OR** ondansetron (ZOFRAN) IV, polyethylene glycol   Time Spent in minutes  25  See all Orders from today for further details   Jeoffrey Massed M.D on 02/28/2020 at 1:44 PM  To page go to [email or web address] - use universal password  [facility] -  Office  [phone]    Objective:   Vitals:   02/28/20 0500 02/28/20 0629 02/28/20 0630 02/28/20 1235  BP:    (!) 121/94  Pulse:    73  Resp:      Temp:    98 F (36.7 C)  TempSrc:    Oral  SpO2: 95% (!) 87% 94% 91%  Weight:      Height:        Wt Readings from Last 3 Encounters:  02/27/20 79 kg  04/30/15 89.8 kg  03/19/15 90.3 kg     Intake/Output Summary (Last 24 hours) at 02/28/2020 1344 Last data filed at 02/28/2020 0414 Gross per 24 hour  Intake 1501.63 ml  Output 825 ml  Net 676.63 ml     Physical Exam Gen  Exam:Alert awake-not in any distress HEENT:atraumatic, normocephalic Chest: B/L clear to auscultation anteriorly CVS:S1S2 regular Abdomen:soft non tender, non distended Extremities:no edema Neurology: Non focal Skin: no rash   Data Review:    CBC Recent Labs  Lab 02/26/20 2234 02/28/20 0339  WBC 5.8 7.2  HGB 17.1* 16.2  HCT 48.9 47.4  PLT 133* 182  MCV 91.4 91.7  MCH 32.0 31.3  MCHC 35.0 34.2  RDW 14.3 13.7  LYMPHSABS 1.2 1.6  MONOABS 0.7 1.1*  EOSABS 0.0 0.0  BASOSABS 0.0 0.0    Chemistries  Recent Labs  Lab 02/26/20 2234 02/28/20 0339  NA 135 137  K 4.4 3.3*  CL 103 105  CO2 21* 22  GLUCOSE 121* 107*  BUN 15 18  CREATININE 1.40* 1.12  CALCIUM 8.1* 8.3*  AST 57* 28  ALT 25 20  ALKPHOS 51 48  BILITOT 2.0* 1.1   ------------------------------------------------------------------------------------------------------------------ No results for input(s): CHOL, HDL, LDLCALC, TRIG, CHOLHDL, LDLDIRECT in the last 72 hours.  Lab Results  Component Value Date   HGBA1C 5.6 02/26/2020   ------------------------------------------------------------------------------------------------------------------ No results for input(s): TSH, T4TOTAL, T3FREE,  THYROIDAB in the last 72 hours.  Invalid input(s): FREET3 ------------------------------------------------------------------------------------------------------------------ Recent Labs    02/28/20 0339  FERRITIN 862*    Coagulation profile No results for input(s): INR, PROTIME in the last 168 hours.  Recent Labs    02/28/20 0339  DDIMER 0.79*    Cardiac Enzymes No results for input(s): CKMB, TROPONINI, MYOGLOBIN in the last 168 hours.  Invalid input(s): CK ------------------------------------------------------------------------------------------------------------------ No results found for: BNP  Micro Results Recent Results (from the past 240 hour(s))  Blood culture (routine x 2)     Status: None  (Preliminary result)   Collection Time: 02/26/20 11:28 PM   Specimen: BLOOD  Result Value Ref Range Status   Specimen Description BLOOD RIGHT ARM  Final   Special Requests   Final    BOTTLES DRAWN AEROBIC AND ANAEROBIC Blood Culture adequate volume Performed at [facility], [address], [place], [place] [address]    Culture NO GROWTH < 12 HOURS  Final   Report Status PENDING  Incomplete  Blood culture (routine x 2)     Status: None (Preliminary result)   Collection Time: 02/26/20 11:39 PM   Specimen: BLOOD  Result Value Ref Range Status   Specimen Description BLOOD LEFT ARM  Final   Special Requests   Final    BOTTLES DRAWN AEROBIC AND ANAEROBIC Blood Culture adequate volume Performed at [facility], [address], [place], [place] [address]    Culture NO GROWTH < 12 HOURS  Final   Report Status PENDING  Incomplete    Radiology Reports CT Head Wo Contrast  Result Date: 02/27/2020 CLINICAL DATA:  Encephalopathy, weakness, COVID-19 positive EXAM: CT HEAD WITHOUT CONTRAST TECHNIQUE: Contiguous axial images were obtained from the base of the skull through the vertex without intravenous contrast. COMPARISON:  None. FINDINGS: Brain: Confluent hypodensities throughout the periventricular white matter are most consistent with chronic small vessel ischemic changes. No other signs of acute infarct or hemorrhage. Lateral ventricles and remaining midline structures are unremarkable. No acute extra-axial fluid collections. No mass effect. Vascular: No hyperdense vessel or unexpected calcification. Skull: Normal. Negative for fracture or focal lesion. Sinuses/Orbits: No acute finding. Other: None. IMPRESSION: 1. 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 2. No acute intracranial process. Electronically Signed   By: Sharlet Salina M.D.   On: 02/27/2020 01:10   DG Chest Port 1 View  Result Date: 02/27/2020 CLINICAL DATA:  Possible sepsis EXAM:  PORTABLE CHEST 1 VIEW COMPARISON:  None. FINDINGS: Increased opacity in the medial right lung base. Small left pleural effusion. Mild cardiomegaly. IMPRESSION: Increased opacity in the medial right lung base, which may indicate developing consolidation. Small left pleural effusion. Electronically Signed   By: Deatra Robinson M.D.   On: 02/27/2020 00:25

## 2020-02-28 NOTE — Progress Notes (Addendum)
CSW received return call from patient's son, Vincent Cummings, at [phone]. He reports that he is in the [employer] and is currently stationed in [place]. Contact info updated.   He has not spoken with his father since 2014 and is not familiar with whether his father had any caretakers. He is reaching out to his mother and brother in [place] to see if they have contact info for any family that would be willing to be patient's contacts in [place] as Vincent Cummings reports he will be shipped out possibly next week and will be unreachable.    CSW noted contact info added for Vincent Cummings on patient's chart. CSW spoke with Vincent Cummings. 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 He was able to state patient's address and reported he believes it is paid for and that the patient receives two checks per month as income. He reported the patient's drivers license is with them at the restaurant and that he is available for any needs, though he is not very involved with patient outside of the restaurant.   Ahnesti Townsend LCSW

## 2020-02-29 LAB — COMPREHENSIVE METABOLIC PANEL
ALT: 22 U/L (ref 0–44)
AST: 25 U/L (ref 15–41)
Albumin: 2.4 g/dL — ABNORMAL LOW (ref 3.5–5.0)
Alkaline Phosphatase: 47 U/L (ref 38–126)
Anion gap: 9 (ref 5–15)
BUN: 18 mg/dL (ref 8–23)
CO2: 22 mmol/L (ref 22–32)
Calcium: 8.1 mg/dL — ABNORMAL LOW (ref 8.9–10.3)
Chloride: 108 mmol/L (ref 98–111)
Creatinine, Ser: 1.13 mg/dL (ref 0.61–1.24)
GFR calc Af Amer: >60 mL/min (ref >60)
GFR calc non Af Amer: >60 mL/min (ref >60)
Glucose, Bld: 110 mg/dL — ABNORMAL HIGH (ref 70–99)
Potassium: 3.3 mmol/L — ABNORMAL LOW (ref 3.5–5.1)
Sodium: 139 mmol/L (ref 135–145)
Total Bilirubin: 0.5 mg/dL (ref 0.3–1.2)
Total Protein: 5.3 g/dL — ABNORMAL LOW (ref 6.5–8.1)

## 2020-02-29 LAB — CBC WITH DIFFERENTIAL/PLATELET
Abs Immature Granulocytes: 0.08 10*3/uL — ABNORMAL HIGH (ref 0.00–0.07)
Basophils Absolute: 0 10*3/uL (ref 0.0–0.1)
Basophils Relative: 0 %
Eosinophils Absolute: 0 10*3/uL (ref 0.0–0.5)
Eosinophils Relative: 0 %
HCT: 45.8 % (ref 39.0–52.0)
Hemoglobin: 15.8 g/dL (ref 13.0–17.0)
Immature Granulocytes: 1 %
Lymphocytes Relative: 18 %
Lymphs Abs: 1.4 10*3/uL (ref 0.7–4.0)
MCH: 31.5 pg (ref 26.0–34.0)
MCHC: 34.5 g/dL (ref 30.0–36.0)
MCV: 91.2 fL (ref 80.0–100.0)
Monocytes Absolute: 1 10*3/uL (ref 0.1–1.0)
Monocytes Relative: 12 %
Neutro Abs: 5.4 10*3/uL (ref 1.7–7.7)
Neutrophils Relative %: 69 %
Platelets: 210 10*3/uL (ref 150–400)
RBC: 5.02 MIL/uL (ref 4.22–5.81)
RDW: 13.8 % (ref 11.5–15.5)
WBC: 7.9 10*3/uL (ref 4.0–10.5)
nRBC: 0 % (ref 0.0–0.2)

## 2020-02-29 LAB — VITAMIN B12: Vitamin B-12: 391 pg/mL (ref 180–914)

## 2020-02-29 LAB — RPR: RPR Ser Ql: NONREACTIVE

## 2020-02-29 LAB — FERRITIN: Ferritin: 961 ng/mL — ABNORMAL HIGH (ref 24–336)

## 2020-02-29 LAB — GLUCOSE, CAPILLARY
Glucose-Capillary: 90 mg/dL (ref 70–99)
Glucose-Capillary: 127 mg/dL — ABNORMAL HIGH (ref 70–99)
Glucose-Capillary: 120 mg/dL — ABNORMAL HIGH (ref 70–99)
Glucose-Capillary: 105 mg/dL — ABNORMAL HIGH (ref 70–99)

## 2020-02-29 LAB — D-DIMER, QUANTITATIVE: D-Dimer, Quant: 0.44 ug/mL-FEU (ref 0.00–0.50)

## 2020-02-29 LAB — C-REACTIVE PROTEIN: CRP: 1.7 mg/dL — ABNORMAL HIGH (ref <1.0)

## 2020-02-29 LAB — TSH: TSH: 0.631 u[IU]/mL (ref 0.350–4.500)

## 2020-02-29 LAB — HIV ANTIBODY (ROUTINE TESTING W REFLEX): HIV Screen 4th Generation wRfx: NONREACTIVE

## 2020-02-29 NOTE — TOC Progression Note (Signed)
Transition of Care [facility]) - Progression Note    Patient Details  Name: Vincent Cummings MRN: [id number] Date of Birth: 1946/02/07  [facility] CM/SW Contact  Mearl Latin, LCSW Phone Number: 02/29/2020, 4:51 PM  Clinical Narrative:    CSW spoke with patient's cousin, Janyth Contes, in [place], Lexicographer provided by [employer]. She reported that she will speak with patient's sister in the [place] and try to get in touch with [name] as well so she can know how to best assist is with the patient. CSW provided her with nursing station number in case she needs to speak with staff over the weekend.         Expected Discharge Plan and Services                                                 Social Determinants of Health (SDOH) Interventions    Readmission Risk Interventions No flowsheet data found.

## 2020-02-29 NOTE — Progress Notes (Signed)
Occupational Therapy Evaluation Patient Details Name: Vincent Cummings MRN: [id number] DOB: [birth year] Today's Date: 02/29/2020    History of Present Illness 74 y.o. male with PMHx of HTN, HLD-who was found confused and wandering outside of a local [employer] restaurant-subsequently brought to the ED 02/26/20 -further evaluation revealed COVID-19 pneumonia.   Clinical Impression   Pt overall disoriented and confused. Unsure of PLOF due to pt poor historian and no family in area, per chart review. Pt overall Mod A for bed mobility to sit EOB and Min A to return to supine. Pt requires multimodal cues for all tasks, follows commands < 50% of time. Pt min to mod A for short distance mobility to sink with RW (and pt furniture walking) and HHA on return to bed. Pt Mod A for oral care to brush teeth standing at sink, required consistent cues for sequencing (OT had to apply toothpaste to toothbrush as pt forgot this step). Pt pleasant, but highly distractible. Pt on RA with 94% SpO2 at rest, 89% after mobilizing to/from sink. Recommend SNF for rehab, as pt unsafe to return home alone at this time.     Follow Up Recommendations  SNF;Supervision/Assistance - 24 hour    Equipment Recommendations  3 in 1 bedside commode    Recommendations for Other Services       Precautions / Restrictions Precautions Precautions: Fall Restrictions Weight Bearing Restrictions: No      Mobility Bed Mobility Overal bed mobility: Needs Assistance Bed Mobility: Supine to Sit;Sit to Supine     Supine to sit: Mod assist Sit to supine: Min assist   General bed mobility comments: Pt attempting to exit from opposite side of bed that therapist was on (bed rails were up, etc). tactile cues needed with pt distractible. Mod A to sit EOB, Min A for B LE to return to supine   Transfers Overall transfer level: Needs assistance Equipment used: None;Rolling walker (2 wheeled) Transfers: Sit to/from [employer] Sit to Stand: Min assist Stand pivot transfers: Mod assist       General transfer comment: Overall Min A for power up from bed with/without RW. Pt Mod A for stand pivot with RW and with HHA when returning to bed     Balance Overall balance assessment: Needs assistance Sitting-balance support: No upper extremity supported;Feet supported Sitting balance-Leahy Scale: Fair     Standing balance support: Bilateral upper extremity supported Standing balance-Leahy Scale: Poor                             ADL either performed or assessed with clinical judgement   ADL Overall ADL's : Needs assistance/impaired Eating/Feeding: Minimal assistance;Sitting   Grooming: Moderate assistance;Standing;Oral care   Upper Body Bathing: Moderate assistance;Sitting;Standing   Lower Body Bathing: Maximal assistance;Sitting/lateral leans;Sit to/from stand   Upper Body Dressing : Moderate assistance;Sitting;Standing   Lower Body Dressing: Maximal assistance;Sitting/lateral leans;Sit to/from stand   Toilet Transfer: Moderate assistance;Stand-pivot   Toileting- Clothing Manipulation and Hygiene: Maximal assistance;Sitting/lateral lean;Sit to/from stand       Functional mobility during ADLs: Moderate assistance;Cueing for safety;Cueing for sequencing General ADL Comments: Pt with decreased ability to follow commands, requires cues for safety and sequencing consistently throughout tasks.      Vision         Perception     Praxis      Pertinent Vitals/Pain Pain Assessment: No/denies pain     Hand Dominance Right  Extremity/Trunk Assessment Upper Extremity Assessment Upper Extremity Assessment: Generalized weakness           Communication Communication Communication: No difficulties   Cognition Arousal/Alertness: Awake/alert Behavior During Therapy: WFL for tasks assessed/performed Overall Cognitive Status: No family/caregiver present to determine baseline  cognitive functioning Area of Impairment: Orientation;Attention;Memory;Following commands;Safety/judgement;Awareness;Problem solving                 Orientation Level: Place;Time;Situation Current Attention Level: Focused Memory: Decreased short-term memory Following Commands: Follows one step commands inconsistently;Follows one step commands with increased time Safety/Judgement: Decreased awareness of safety;Decreased awareness of deficits Awareness: Intellectual Problem Solving: Slow processing;Difficulty sequencing;Requires verbal cues;Requires tactile cues General Comments: Pt with difficulty following directions (about 50% of the time). Pt denied need for RW use, but was attempting to reach for furniture in room to stand. tried RW with pt only using one hand and one hand on sink. Perseverating on reaching for objects   General Comments  When asked orientation questions, pt unsure of which month it is , reporting september     Exercises     Shoulder Instructions      Home Living Family/patient expects to be discharged to:: Private residence Living Arrangements: Alone Available Help at Discharge: Available PRN/intermittently;Friend(s) Type of Home: House Home Access: Stairs to enter [employer] of Steps: 10 Entrance Stairs-Rails: Right;Left Home Layout: One level     Bathroom Shower/Tub: [place]: Grab bars - tub/shower   Additional Comments: all information per pt (with noted decr cognition); per LCSW note pt has no family in area       Prior Functioning/Environment Level of Independence: Independent                 OT Problem List: Decreased activity tolerance;Impaired balance (sitting and/or standing);Decreased coordination;Decreased cognition;Decreased safety awareness;Decreased knowledge of use of DME or AE      OT Treatment/Interventions: Self-care/ADL training;Therapeutic exercise;Energy conservation;DME  and/or AE instruction;Therapeutic activities;Patient/family education    OT Goals(Current goals can be found in the care plan section) Acute Rehab OT Goals Patient Stated Goal: none stated OT Goal Formulation: With patient Time For Goal Achievement: 03/14/20 Potential to Achieve Goals: Fair ADL Goals Pt Will Perform Eating: with set-up;sitting Pt Will Perform Grooming: with min guard assist;standing Pt Will Perform Upper Body Bathing: with min guard assist;sitting;standing Pt Will Perform Lower Body Bathing: with min assist;sit to/from stand;sitting/lateral leans Pt Will Transfer to Toilet: with min guard assist;ambulating;regular height toilet Pt Will Perform Toileting - Clothing Manipulation and hygiene: with min assist;sit to/from stand;sitting/lateral leans  OT Frequency: Min 2X/week   Barriers to D/C:            Co-evaluation              AM-PAC OT "6 Clicks" Daily Activity     Outcome Measure Help from another person eating meals?: A Little Help from another person taking care of personal grooming?: A Little Help from another person toileting, which includes using toliet, bedpan, or urinal?: A Lot Help from another person bathing (including washing, rinsing, drying)?: A Lot Help from another person to put on and taking off regular upper body clothing?: A Little Help from another person to put on and taking off regular lower body clothing?: A Lot 6 Click Score: 15   End of Session Equipment Utilized During Treatment: Gait belt;Rolling walker Nurse Communication: Mobility status  Activity Tolerance: Patient tolerated treatment well;Other (comment)(limited by confusion)  Patient left: in chair;with call bell/phone within reach;with bed alarm set  OT Visit Diagnosis: Unsteadiness on feet (R26.81);Other abnormalities of gait and mobility (R26.89);Muscle weakness (generalized) (M62.81)                Time: 7341-9379 OT Time Calculation (min): 22 min Charges:  OT General  Charges $OT Visit: 1 Visit OT Evaluation $OT Eval Moderate Complexity: 1 Mod  Lorre Munroe, OTR/L  Lorre Munroe 02/29/2020, 2:38 PM

## 2020-02-29 NOTE — Consult Note (Signed)
Telepsych Consultation   Reason for Consult:  Underlying psych disorder Referring Physician:  Dr. Jerral Ralph Location of Patient:  [phone] Location of Provider: [facility]  Patient Identification: Vincent Cummings MRN:  [id number] Principal Diagnosis: Pneumonia due to COVID-19 virus Diagnosis:  Principal Problem:   Pneumonia due to COVID-19 virus Active Problems:   Essential hypertension   Acute metabolic encephalopathy   Hyperglycemia   GERD without esophagitis   Sepsis due to COVID-19 [facility]   Total Time spent with patient: 30 minutes  Subjective:   Vincent Cummings is a 74 y.o. male patient admitted with altered mental status. He is a poor historian and remains confused with poor insight at this time. He is alert to self and place. He states today is 23-Feb-1946 and he was brought to the hospital at the recommendation of the [employer]. He is able to report the current president as Duane Boston. He also reports his birthday is 12/26/1945, this was asked twice and he provided the same answer. He denies any psych history, inpatient history, outpatient psych history, suicide attempts or family history of psychiatric illness. Unable to obtain any other pertinent details at this time due to his orientation, although he is awake and alert.   HPI:  74 year old male with past medical history of hypertension, gout, gastroesophageal reflux disease who was brought into [facility] emergency department via EMS after being found wandering outside of a restaurant confused.  Patient is an extremely poor historian due to confusion with disorientation.  Per my discussion with the patient, he states he does not know why he is here.  Patient does not know the year.  He seems to know that he is in the hospital but is unable to provide further detail.  Patient denies shortness of breath, chest pain, weakness, headaches, sick contacts, change in taste, change in smell, recent travel or  confirmed contact with COVID-19.  Upon evaluation in the emergency department, COVID-19 antigen testing came back positive.  Furthermore, initial chest x-ray reveals bibasilar patchy infiltrates concerning for atypical COVID-19 pneumonia.  She was also found to have multiple SIRS criteria including tachycardia and fever with Tmax of 101.3.  Patient was noted to have an oxygen saturation of 90% on arrival and therefore was placed on 2 L of oxygen via nasal cannula.  Due to patient's encephalopathy, SIRS, mild hypoxia and suspected COVID-19 infection the hospitalist group has been called to assess patient for admission to the hospital.   Past Psychiatric History: As per patient he denies.   Risk to Self:   Risk to Others:   Prior Inpatient Therapy:   Prior Outpatient Therapy:    Past Medical History:  Past Medical History:  Diagnosis Date  . Essential hypertension   . GERD without esophagitis   . Gout    History reviewed. No pertinent surgical history. Family History: History reviewed. No pertinent family history. Family Psychiatric  History: As per patient he denies.  Social History:  Social History   Substance and Sexual Activity  Alcohol Use No  . Alcohol/week: 0.0 standard drinks     Social History   Substance and Sexual Activity  Drug Use Not on file    Social History   Socioeconomic History  . Marital status: Married    Spouse name: Not on file  . Number of children: Not on file  . Years of education: Not on file  . Highest education level: Not on file  Occupational History  . Not  on file  Tobacco Use  . Smoking status: Never Smoker  . Smokeless tobacco: Never Used  Substance and Sexual Activity  . Alcohol use: No    Alcohol/week: 0.0 standard drinks  . Drug use: Not on file  . Sexual activity: Not on file  Other Topics Concern  . Not on file  Social History Narrative  . Not on file   Social Determinants of Health   Financial Resource Strain:   .  Difficulty of Paying Living Expenses:   Food Insecurity:   . Worried About Charity fundraiser in the Last Year:   . Arboriculturist in the Last Year:   Transportation Needs:   . Film/video editor (Medical):   Marland Kitchen Lack of Transportation (Non-Medical):   Physical Activity:   . Days of Exercise per Week:   . Minutes of Exercise per Session:   Stress:   . Feeling of Stress :   Social Connections:   . Frequency of Communication with Friends and Family:   . Frequency of Social Gatherings with Friends and Family:   . Attends Religious Services:   . Active Member of Clubs or Organizations:   . Attends Archivist Meetings:   Marland Kitchen Marital Status:    Additional Social History:    Allergies:   Allergies  Allergen Reactions  . Atorvastatin Itching and Rash    Labs:  Results for orders placed or performed during the hospital encounter of 02/26/20 (from the past 48 hour(s))  Glucose, capillary     Status: Abnormal   Collection Time: 02/27/20 12:02 PM  Result Value Ref Range   Glucose-Capillary 130 (H) 70 - 99 mg/dL    Comment: Glucose reference range applies only to samples taken after fasting for at least 8 hours.  Glucose, capillary     Status: Abnormal   Collection Time: 02/27/20  4:39 PM  Result Value Ref Range   Glucose-Capillary 104 (H) 70 - 99 mg/dL    Comment: Glucose reference range applies only to samples taken after fasting for at least 8 hours.  Glucose, capillary     Status: Abnormal   Collection Time: 02/27/20  9:08 PM  Result Value Ref Range   Glucose-Capillary 128 (H) 70 - 99 mg/dL    Comment: Glucose reference range applies only to samples taken after fasting for at least 8 hours.  C-reactive protein     Status: Abnormal   Collection Time: 02/28/20  3:39 AM  Result Value Ref Range   CRP 4.2 (H) <1.0 mg/dL    Comment: Performed at [facility] [address], [place], [place] [address]  CBC with Differential/Platelet     Status: Abnormal    Collection Time: 02/28/20  3:39 AM  Result Value Ref Range   WBC 7.2 4.0 - 10.5 K/uL   RBC 5.17 4.22 - 5.81 MIL/uL   Hemoglobin 16.2 13.0 - 17.0 g/dL   HCT 47.4 39.0 - 52.0 %   MCV 91.7 80.0 - 100.0 fL   MCH 31.3 26.0 - 34.0 pg   MCHC 34.2 30.0 - 36.0 g/dL   RDW 13.7 11.5 - 15.5 %   Platelets 182 150 - 400 K/uL   nRBC 0.0 0.0 - 0.2 %   Neutrophils Relative % 62 %   Neutro Abs 4.4 1.7 - 7.7 K/uL   Lymphocytes Relative 22 %   Lymphs Abs 1.6 0.7 - 4.0 K/uL   Monocytes Relative 15 %   Monocytes Absolute 1.1 (  H) 0.1 - 1.0 K/uL   Eosinophils Relative 0 %   Eosinophils Absolute 0.0 0.0 - 0.5 K/uL   Basophils Relative 0 %   Basophils Absolute 0.0 0.0 - 0.1 K/uL   Immature Granulocytes 1 %   Abs Immature Granulocytes 0.05 0.00 - 0.07 K/uL    Comment: Performed at [facility], [address] [place], [place] [address]  Comprehensive metabolic panel     Status: Abnormal   Collection Time: 02/28/20  3:39 AM  Result Value Ref Range   Sodium 137 135 - 145 mmol/L   Potassium 3.3 (L) 3.5 - 5.1 mmol/L   Chloride 105 98 - 111 mmol/L   CO2 22 22 - 32 mmol/L   Glucose, Bld 107 (H) 70 - 99 mg/dL    Comment: Glucose reference range applies only to samples taken after fasting for at least 8 hours.   BUN 18 8 - 23 mg/dL   Creatinine, Ser 8.54 0.61 - 1.24 mg/dL   Calcium 8.3 (L) 8.9 - 10.3 mg/dL   Total Protein 5.4 (L) 6.5 - 8.1 g/dL   Albumin 2.4 (L) 3.5 - 5.0 g/dL   AST 28 15 - 41 U/L   ALT 20 0 - 44 U/L   Alkaline Phosphatase 48 38 - 126 U/L   Total Bilirubin 1.1 0.3 - 1.2 mg/dL   GFR calc non Af Amer >60 >60 mL/min   GFR calc Af Amer >60 >60 mL/min   Anion gap 10 5 - 15    Comment: Performed at [facility], [address], [place], [place] [address]  D-dimer, quantitative (not at [facility])     Status: Abnormal   Collection Time: 02/28/20  3:39 AM  Result Value Ref Range   D-Dimer, Quant 0.79 (H) 0.00 - 0.50 ug/mL-FEU    Comment: (NOTE) At the manufacturer cut-off of 0.50  ug/mL FEU, this assay has been documented to exclude PE with a sensitivity and negative predictive value of 97 to 99%.  At this time, this assay has not been approved by the FDA to exclude DVT/VTE. Results should be correlated with clinical presentation. Performed at [facility], [address], [place], [place] [address]   Ferritin     Status: Abnormal   Collection Time: 02/28/20  3:39 AM  Result Value Ref Range   Ferritin 862 (H) 24 - 336 ng/mL    Comment: Performed at [facility], [address] [place], [place] [address]  Procalcitonin     Status: None   Collection Time: 02/28/20  3:39 AM  Result Value Ref Range   Procalcitonin <0.10 ng/mL    Comment:        Interpretation: PCT (Procalcitonin) <= 0.5 ng/mL: Systemic infection (sepsis) is not likely. Local bacterial infection is possible. (NOTE)       Sepsis PCT Algorithm           Lower Respiratory Tract                                      Infection PCT Algorithm    ----------------------------     ----------------------------         PCT < 0.25 ng/mL                PCT < 0.10 ng/mL         Strongly encourage  Strongly discourage   discontinuation of antibiotics    initiation of antibiotics    ----------------------------     -----------------------------       PCT 0.25 - 0.50 ng/mL            PCT 0.10 - 0.25 ng/mL               OR       >80% decrease in PCT            Discourage initiation of                                            antibiotics      Encourage discontinuation           of antibiotics    ----------------------------     -----------------------------         PCT >= 0.50 ng/mL              PCT 0.26 - 0.50 ng/mL               AND        <80% decrease in PCT             Encourage initiation of                                             antibiotics       Encourage continuation           of antibiotics    ----------------------------     -----------------------------        PCT  >= 0.50 ng/mL                  PCT > 0.50 ng/mL               AND         increase in PCT                  Strongly encourage                                      initiation of antibiotics    Strongly encourage escalation           of antibiotics                                     -----------------------------                                           PCT <= 0.25 ng/mL                                                 OR                                        >  80% decrease in PCT                                     Discontinue / Do not initiate                                             antibiotics Performed at [facility], [address] [place], [place] [address]   Glucose, capillary     Status: None   Collection Time: 02/28/20  7:52 AM  Result Value Ref Range   Glucose-Capillary 94 70 - 99 mg/dL    Comment: Glucose reference range applies only to samples taken after fasting for at least 8 hours.  Glucose, capillary     Status: Abnormal   Collection Time: 02/28/20 12:34 PM  Result Value Ref Range   Glucose-Capillary 116 (H) 70 - 99 mg/dL    Comment: Glucose reference range applies only to samples taken after fasting for at least 8 hours.  Glucose, capillary     Status: Abnormal   Collection Time: 02/28/20  4:59 PM  Result Value Ref Range   Glucose-Capillary 117 (H) 70 - 99 mg/dL    Comment: Glucose reference range applies only to samples taken after fasting for at least 8 hours.  Glucose, capillary     Status: Abnormal   Collection Time: 02/28/20  8:56 PM  Result Value Ref Range   Glucose-Capillary 123 (H) 70 - 99 mg/dL    Comment: Glucose reference range applies only to samples taken after fasting for at least 8 hours.  C-reactive protein     Status: Abnormal   Collection Time: 02/29/20  3:03 AM  Result Value Ref Range   CRP 1.7 (H) <1.0 mg/dL    Comment: Performed at [facility], [address] [place], [place] [address]  CBC with Differential/Platelet      Status: Abnormal   Collection Time: 02/29/20  3:03 AM  Result Value Ref Range   WBC 7.9 4.0 - 10.5 K/uL   RBC 5.02 4.22 - 5.81 MIL/uL   Hemoglobin 15.8 13.0 - 17.0 g/dL   HCT 09.845.8 11.939.0 - 14.752.0 %   MCV 91.2 80.0 - 100.0 fL   MCH 31.5 26.0 - 34.0 pg   MCHC 34.5 30.0 - 36.0 g/dL   RDW 82.913.8 56.211.5 - 13.015.5 %   Platelets 210 150 - 400 K/uL   nRBC 0.0 0.0 - 0.2 %   Neutrophils Relative % 69 %   Neutro Abs 5.4 1.7 - 7.7 K/uL   Lymphocytes Relative 18 %   Lymphs Abs 1.4 0.7 - 4.0 K/uL   Monocytes Relative 12 %   Monocytes Absolute 1.0 0.1 - 1.0 K/uL   Eosinophils Relative 0 %   Eosinophils Absolute 0.0 0.0 - 0.5 K/uL   Basophils Relative 0 %   Basophils Absolute 0.0 0.0 - 0.1 K/uL   Immature Granulocytes 1 %   Abs Immature Granulocytes 0.08 (H) 0.00 - 0.07 K/uL    Comment: Performed at [facility], [address] [place], [place] [address]  Comprehensive metabolic panel     Status: Abnormal   Collection Time: 02/29/20  3:03 AM  Result Value Ref Range   Sodium 139 135 - 145 mmol/L   Potassium 3.3 (L) 3.5 -  5.1 mmol/L   Chloride 108 98 - 111 mmol/L   CO2 22 22 - 32 mmol/L   Glucose, Bld 110 (H) 70 - 99 mg/dL    Comment: Glucose reference range applies only to samples taken after fasting for at least 8 hours.   BUN 18 8 - 23 mg/dL   Creatinine, Ser 1.61 0.61 - 1.24 mg/dL   Calcium 8.1 (L) 8.9 - 10.3 mg/dL   Total Protein 5.3 (L) 6.5 - 8.1 g/dL   Albumin 2.4 (L) 3.5 - 5.0 g/dL   AST 25 15 - 41 U/L   ALT 22 0 - 44 U/L   Alkaline Phosphatase 47 38 - 126 U/L   Total Bilirubin 0.5 0.3 - 1.2 mg/dL   GFR calc non Af Amer >60 >60 mL/min   GFR calc Af Amer >60 >60 mL/min   Anion gap 9 5 - 15    Comment: Performed at [facility], [address], [place], [place] [address]  D-dimer, quantitative (not at [facility])     Status: None   Collection Time: 02/29/20  3:03 AM  Result Value Ref Range   D-Dimer, Quant 0.44 0.00 - 0.50 ug/mL-FEU    Comment: (NOTE) At the manufacturer cut-off  of 0.50 ug/mL FEU, this assay has been documented to exclude PE with a sensitivity and negative predictive value of 97 to 99%.  At this time, this assay has not been approved by the FDA to exclude DVT/VTE. Results should be correlated with clinical presentation. Performed at [facility], [address] [place], [place] [address]   Ferritin     Status: Abnormal   Collection Time: 02/29/20  3:03 AM  Result Value Ref Range   Ferritin 961 (H) 24 - 336 ng/mL    Comment: Performed at [facility], [address] [place], [place] [address]  TSH     Status: None   Collection Time: 02/29/20  3:03 AM  Result Value Ref Range   TSH 0.631 0.350 - 4.500 uIU/mL    Comment: Performed by a 3rd Generation assay with a functional sensitivity of <=0.01 uIU/mL. Performed at [facility], [address] [place], [place] [address]   Vitamin B12     Status: None   Collection Time: 02/29/20  3:03 AM  Result Value Ref Range   Vitamin B-12 391 180 - 914 pg/mL    Comment: (NOTE) This assay is not validated for testing neonatal or myeloproliferative syndrome specimens for Vitamin B12 levels. Performed at [facility], [address], [place], [place] [address]   HIV Antibody (routine testing w rflx)     Status: None   Collection Time: 02/29/20  3:03 AM  Result Value Ref Range   HIV Screen 4th Generation wRfx NON REACTIVE NON REACTIVE    Comment: Performed at [facility], [address] [place], [place] [address]  Glucose, capillary     Status: None   Collection Time: 02/29/20  7:48 AM  Result Value Ref Range   Glucose-Capillary 90 70 - 99 mg/dL    Comment: Glucose reference range applies only to samples taken after fasting for at least 8 hours.    Medications:  Current Facility-Administered Medications  Medication Dose Route Frequency Provider Last Rate Last Admin  . acetaminophen (TYLENOL) tablet 650 mg  650 mg Oral Q6H PRN Shalhoub, Deno Lunger, MD      . albuterol  (VENTOLIN HFA) 108 (90 Base) MCG/ACT inhaler  2 puff  2 puff Inhalation Q4H PRN Shalhoub, Deno Lunger, MD      . amLODipine (NORVASC) tablet 5 mg  5 mg Oral Daily Shalhoub, Deno Lunger, MD   5 mg at 02/29/20 0810  . chlorpheniramine-HYDROcodone (TUSSIONEX) 10-8 MG/5ML suspension 5 mL  5 mL Oral Q12H PRN Shalhoub, Deno Lunger, MD      . clotrimazole (LOTRIMIN) 1 % cream   Topical BID Shalhoub, Deno Lunger, MD   Given at 02/29/20 0813  . dexamethasone (DECADRON) injection 6 mg  6 mg Intravenous Q24H Shalhoub, Deno Lunger, MD   6 mg at 02/29/20 0811  . enoxaparin (LOVENOX) injection 40 mg  40 mg Subcutaneous Daily Shalhoub, Deno Lunger, MD   40 mg at 02/29/20 9147  . guaiFENesin-dextromethorphan (ROBITUSSIN DM) 100-10 MG/5ML syrup 10 mL  10 mL Oral Q4H PRN Shalhoub, Deno Lunger, MD      . hydrALAZINE (APRESOLINE) injection 10 mg  10 mg Intravenous Q6H PRN Shalhoub, Deno Lunger, MD      . insulin aspart (novoLOG) injection 0-15 Units  0-15 Units Subcutaneous TID AC & HS Shalhoub, Deno Lunger, MD   2 Units at 02/27/20 1206  . ondansetron (ZOFRAN) tablet 4 mg  4 mg Oral Q6H PRN Shalhoub, Deno Lunger, MD       Or  . ondansetron [facility]) injection 4 mg  4 mg Intravenous Q6H PRN Shalhoub, Deno Lunger, MD      . pantoprazole (PROTONIX) EC tablet 40 mg  40 mg Oral Daily Shalhoub, Deno Lunger, MD   40 mg at 02/29/20 8295  . polyethylene glycol (MIRALAX / GLYCOLAX) packet 17 g  17 g Oral Daily PRN Shalhoub, Deno Lunger, MD      . remdesivir 100 mg in sodium chloride 0.9 % 100 mL IVPB  100 mg Intravenous Daily Shalhoub, Deno Lunger, MD 200 mL/hr at 02/29/20 0942 100 mg at 02/29/20 6213    Musculoskeletal: Strength & Muscle Tone: within normal limits Gait & Station: normal Patient leans: N/A  Psychiatric Specialty Exam: Physical Exam  Review of Systems  Blood pressure (!) 149/106, pulse 79, temperature 98.7 F (37.1 C), temperature source Oral, resp. rate 17, height  (1.727 m), weight 80.5 kg, SpO2 90 %.Body mass index is 26.98 kg/m.   General Appearance: Disheveled  Eye Contact:  Minimal  Speech:  Clear and Coherent and Slow  Volume:  Normal  Mood:  Anxious  Affect:  Congruent and Restricted  Thought Process:  Irrelevant and Descriptions of Associations: Loose  Orientation:  Other:  alert and oriented x 2  Thought Content:  Ideas of Reference:   Delusions, Rumination and Tangential  Suicidal Thoughts:  No  Homicidal Thoughts:  No  Memory:  Immediate;   Fair Recent;   Fair  Judgement:  Poor  Insight:  Lacking  Psychomotor Activity:  Psychomotor Retardation  Concentration:  Concentration: Poor and Attention Span: Fair  Recall:  Poor  Fund of Knowledge:  Fair  Language:  Fair  Akathisia:  No  Handed:  Right  AIMS (if indicated):     Assets:  Communication Skills Desire for Improvement Financial Resources/Insurance Housing Leisure Time Resilience Social Support  ADL's:  Impaired  Cognition:  Impaired,  Mild  Sleep:        Treatment Plan Summary: Plan Patient does not present with any psychosis at this time, and denies prsch history. If symptoms persist or worsen consider starting low dose anti-psychotic Zyprexa 2.5mg  po qhs for psychosis. WIll recommend continuing work-up for  altered mental status, very well could be secondary to infection, encephalopathy, and delirium.  At this time writer was unable to reach his son to [place] more information about his history prior to this hospitalization. Patient denies SI/HI/AVH as well as previous history of such. Please re-consult if needed.   Disposition: No evidence of imminent risk to self or others at present.   Patient does not meet criteria for psychiatric inpatient admission. Supportive therapy provided about ongoing stressors. Discussed crisis plan, support from social network, calling 911, coming to the Emergency Department, and calling Suicide Hotline. outpatient evaluation for underlying mental condition. Per chart review patient has a history of being  paranoid and hermit style, and has been functioning on his own for this extended amount of time.   This service was provided via telemedicine using a 2-way, interactive audio and video technology.  Names of all persons participating in this telemedicine service and their role in this encounter. Name: Caryn Bee Role: FNP-BC, PMHNP-BC, DNP  Name: Vincent Cummings Role: Patient    Maryagnes Amos, FNP 02/29/2020 10:03 AM

## 2020-02-29 NOTE — [place] FL2 (Signed)
[place] MEDICAID FL2 LEVEL OF CARE SCREENING TOOL     IDENTIFICATION  Patient Name: Vincent Cummings Birthdate: [birth year] Sex: male Admission Date (Current Location): 02/26/2020  [facility] and [place] Number:  Herbalist and Address:  [facility], [place] [address], [place], [place] [address]      Provider Number: [id number]  Attending Physician Name and Address:  Jonetta Osgood, MD  Relative Name and Phone Number:  [name], son, [phone]    Current Level of Care: Hospital Recommended Level of Care: [place] Prior Approval Number:    Date Approved/Denied:   PASRR Number: [id number]  Discharge Plan: SNF    Current Diagnoses: Patient Active Problem List   Diagnosis Date Noted  . Pneumonia due to COVID-19 virus 02/27/2020  . Acute metabolic encephalopathy 41/96/2229  . Hyperglycemia 02/27/2020  . GERD without esophagitis 02/27/2020  . Sepsis due to COVID-19 ([place]) 02/27/2020  . Gout 01/08/2015  . Thyroid nodule 10/31/2014  . HLD (hyperlipidemia) 10/31/2014  . Cerebral vascular accident ([place]) 10/01/2014  . Arthritis of knee, degenerative 07/30/2014  . Dizziness 07/30/2014  . Left knee pain 12/16/2013  . Essential hypertension 03/17/2012    Orientation RESPIRATION BLADDER Height & Weight     Self, Place  Normal Incontinent Weight: 177 lb 7.5 oz (80.5 kg) Height:  5\' 8"  (172.7 cm)  BEHAVIORAL SYMPTOMS/MOOD NEUROLOGICAL BOWEL NUTRITION STATUS      Incontinent Diet(Please see DC Summary)  AMBULATORY STATUS COMMUNICATION OF NEEDS Skin   Limited Assist   PU Stage and Appropriate Care(Stage I on coccyx)                       Personal Care Assistance Level of Assistance  Bathing, Feeding, Dressing Bathing Assistance: Maximum assistance Feeding assistance: Limited assistance Dressing Assistance: Limited assistance     Functional Limitations Info  Sight, Hearing, Speech Sight Info: Adequate Hearing  Info: Adequate Speech Info: Adequate    SPECIAL CARE FACTORS FREQUENCY  PT (By licensed PT), OT (By licensed OT)     PT Frequency: 5x/week OT Frequency: 5x/week            Contractures Contractures Info: Not present    Additional Factors Info  Code Status, Allergies, Insulin Sliding Scale, Isolation Precautions Code Status Info: Full Allergies Info: Atorvastatin   Insulin Sliding Scale Info: See DC Summary Isolation Precautions Info: COVID + on 02/27/20     Current Medications (02/29/2020):  This is the current hospital active medication list Current Facility-Administered Medications  Medication Dose Route Frequency Provider Last Rate Last Admin  . acetaminophen (TYLENOL) tablet 650 mg  650 mg Oral Q6H PRN Shalhoub, Sherryll Burger, MD      . albuterol (VENTOLIN HFA) 108 (90 Base) MCG/ACT inhaler 2 puff  2 puff Inhalation Q4H PRN Shalhoub, Sherryll Burger, MD      . amLODipine (NORVASC) tablet 5 mg  5 mg Oral Daily Shalhoub, Sherryll Burger, MD   5 mg at 02/29/20 0810  . chlorpheniramine-HYDROcodone (TUSSIONEX) 10-8 MG/5ML suspension 5 mL  5 mL Oral Q12H PRN Shalhoub, Sherryll Burger, MD      . clotrimazole (LOTRIMIN) 1 % cream   Topical BID Shalhoub, Sherryll Burger, MD   Given at 02/29/20 0813  . dexamethasone (DECADRON) injection 6 mg  6 mg Intravenous Q24H Shalhoub, Sherryll Burger, MD   6 mg at 02/29/20 0811  . enoxaparin (LOVENOX) injection 40 mg  40 mg Subcutaneous Daily Shalhoub, Sherryll Burger,  MD   40 mg at 02/29/20 0812  . guaiFENesin-dextromethorphan (ROBITUSSIN DM) 100-10 MG/5ML syrup 10 mL  10 mL Oral Q4H PRN Shalhoub, Deno Lunger, MD      . hydrALAZINE (APRESOLINE) injection 10 mg  10 mg Intravenous Q6H PRN Shalhoub, Deno Lunger, MD      . insulin aspart (novoLOG) injection 0-15 Units  0-15 Units Subcutaneous TID AC & HS Shalhoub, Deno Lunger, MD   2 Units at 02/27/20 1206  . ondansetron (ZOFRAN) tablet 4 mg  4 mg Oral Q6H PRN Shalhoub, Deno Lunger, MD       Or  . ondansetron [facility]) injection 4 mg  4 mg Intravenous Q6H  PRN Shalhoub, Deno Lunger, MD      . pantoprazole (PROTONIX) EC tablet 40 mg  40 mg Oral Daily Shalhoub, Deno Lunger, MD   40 mg at 02/29/20 4268  . polyethylene glycol (MIRALAX / GLYCOLAX) packet 17 g  17 g Oral Daily PRN Shalhoub, Deno Lunger, MD      . remdesivir 100 mg in sodium chloride 0.9 % 100 mL IVPB  100 mg Intravenous Daily Shalhoub, Deno Lunger, MD   Stopped at 02/28/20 1112     Discharge Medications: Please see discharge summary for a list of discharge medications.  Relevant Imaging Results:  Relevant Lab Results:   Additional Information SSN: [id number] [place], [place]

## 2020-02-29 NOTE — Progress Notes (Signed)
PROGRESS NOTE                                                                                                                                                                                                             Patient Demographics:    Vincent Cummings, is a 74 y.o. male, DOB - 11-24-1945, [id number]  Outpatient Primary MD for the patient is [name], MD   Admit date - 02/26/2020   LOS - 2  Chief Complaint  Patient presents with  . Altered Mental Status  . Weakness       Brief Narrative: Patient is a 74 y.o. male with PMHx of HTN, HLD-who was found confused and wandering outside of a local [place] restaurant-subsequently brought to the ED -further evaluation revealed COVID-19 pneumonia.  See below for further details.  Significant Events: 4/13>> brought to ED by EMS for weakness, confusion-found wandering outside a local [employer] since 4/11.  COVID-19 medications: Steroids:4/13>> Remdesivir: 4/13>>  Antibiotics: Zithromax: 4/13>>4/15 Rocephin: 4/14>>4/15 Vancomycin: 4/13 x 1 Zosyn: 4/13 x 1  Microbiology data: 4/13>> blood culture: No growth  DVT prophylaxis: SQ Lovenox  Procedures: None  Consults: None    Subjective:   He is stable-denies any chest pain or shortness of breath.  He still is somewhat confused although confusion is only minimally better than yesterday.    When asked how old he is-he told me he is 74 years old.  I attempted to redirect him-he still was not able to get his right age.  When asked who he lives with-he tells me he lives with his son Vincent Cummings.  He does know that Vincent Cummings is serving in the [employer].  When asked how it was possible for Vincent Cummings to be living with him-when he is serving in the [employer]-he then took some time-and then was able to tell me that he was living alone.  After more some redirection-he was able to tell me that he is in [facility].  When asked what was wrong with him-he initially was not able to answer-but then after redirection (MD had told him yesterday that he had Covid pneumonia) he was able to tell me he had Covid pneumonia.    Assessment  & Plan :   Acute Hypoxic Resp Failure due to Covid 19 Viral pneumonia: Initially hypoxic when  he first presented to the hospital-no oxygen requirements for the past few days.  Do not think patient has sepsis physiology.  Continue steroids/remdesivir-since procalcitonin levels not elevated-and due to low suspicion for bacterial pneumonia-Rocephin/Zithromax were discontinued.  Continue to clinical trajectory/inflammatory markers.  Fever: afebrile  O2 requirements:  SpO2: 92 % O2 Flow Rate (L/min): 2 L/min   COVID-19 Labs: Recent Labs    02/28/20 0339 02/29/20 0303  DDIMER 0.79* 0.44  FERRITIN 862* 961*  CRP 4.2* 1.7*    No results found for: BNP  Recent Labs  Lab 02/28/20 0339  PROCALCITON <0.10    No results found for: SARSCOV2NAA   Prone/Incentive Spirometry: encouraged  incentive spirometry use 3-4/hour.  Likely secondary to acute metabolic encephalopathy superimposed on dementia/chronic paranoia: Suspect some amount of confusion probably was from metabolic encephalopathy in the setting of COVID-19 pneumonia.  Suspect that patient may have some underlying cognitive dysfunction at baseline.  Although he has improved somewhat-he is still confused-he is not fully oriented-and has really no insight to his underlying medical issues.  Dementia work-up negative so far-CT head without acute abnormalities.  TSH/vitamin B12 within normal limits.  HIV/RPR nonreactive.  Continue supportive care-we will see how much more his confusion can clear up.  Psych evaluation in progress to see if he has any underlying psychiatric disorder.  Per my conversation with [name] today (number in facesheet)-who has known this patient for the past number of years-patient has been  appearing more confused over the past few months-and it was more pronounced over the past few days.  Per 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 has noticed memory loss issues-and some confusion over the past few months.  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  Per patient's son-patient has been estranged from rest of the family for the past number of years-patient's son last talked to him in 2014.  Patient's ex-wife is in [place].   AKI: Suspect this is hemodynamically mediated-resolved  HTN: Blood pressure relatively stable-continue amlodipine.  Follow and adjust.  GERD: Continue PPI   RN pressure injury documentation: Pressure Injury 02/27/20 Coccyx Stage 1 -  Intact skin with non-blanchable redness of a localized area usually over a bony prominence. (Active)  02/27/20 0500  Location: Coccyx  Location Orientation:   Staging: Stage 1 -  Intact skin with non-blanchable redness of a localized area usually over a bony prominence.  Wound Description (Comments):   Present on Admission: Yes    ABG: No results found for: PHART, PCO2ART, PO2ART, HCO3, TCO2, ACIDBASEDEF, O2SAT  Vent Settings: N/  Condition -  Guarded  Family Communication  : Son over the phone 4/15-unable to reach him on 4/16, was able to reach friend [name] on 4/16.  Code Status :  Full Code  Diet :  Diet Order            Diet Heart Room service appropriate? Yes; Fluid consistency: Thin  Diet effective now               Disposition Plan  :  Remain hospitalized  Barriers to discharge: Hypoxia requiring O2 supplementation/complete 5 days of IV Remdesivir  Antimicorbials  :    Anti-infectives (From admission, onward)   Start     Dose/Rate Route Frequency Ordered Stop   02/28/20 1000  remdesivir 100 mg in sodium  chloride 0.9 % 100 mL  IVPB     100 mg 200 mL/hr over 30 Minutes Intravenous Daily 02/27/20 0244 03/03/20 0959   02/27/20 0800  cefTRIAXone (ROCEPHIN) 1 g in sodium chloride 0.9 % 100 mL IVPB  Status:  Discontinued     1 g 200 mL/hr over 30 Minutes Intravenous Every 24 hours 02/27/20 0234 02/28/20 1409   02/27/20 0600  azithromycin (ZITHROMAX) 500 mg in sodium chloride 0.9 % 250 mL IVPB  Status:  Discontinued     500 mg 250 mL/hr over 60 Minutes Intravenous Daily 02/27/20 0234 02/28/20 1409   02/27/20 0330  remdesivir 100 mg in sodium chloride 0.9 % 100 mL IVPB     100 mg 200 mL/hr over 30 Minutes Intravenous Every 30 min 02/27/20 0244 02/27/20 0520   02/27/20 0000  piperacillin-tazobactam (ZOSYN) IVPB 3.375 g     3.375 g 100 mL/hr over 30 Minutes Intravenous  Once 02/26/20 2345 02/27/20 0124   02/27/20 0000  vancomycin (VANCOCIN) IVPB 1000 mg/200 mL premix     1,000 mg 200 mL/hr over 60 Minutes Intravenous  Once 02/26/20 2345 02/27/20 0200      Inpatient Medications  Scheduled Meds: . amLODipine  5 mg Oral Daily  . clotrimazole   Topical BID  . dexamethasone (DECADRON) injection  6 mg Intravenous Q24H  . enoxaparin (LOVENOX) injection  40 mg Subcutaneous Daily  . insulin aspart  0-15 Units Subcutaneous TID AC & HS  . pantoprazole  40 mg Oral Daily   Continuous Infusions: . remdesivir 100 mg in NS 100 mL 100 mg (02/29/20 0942)   PRN Meds:.acetaminophen, albuterol, chlorpheniramine-HYDROcodone, guaiFENesin-dextromethorphan, hydrALAZINE, ondansetron **OR** ondansetron (ZOFRAN) IV, polyethylene glycol   Time Spent in minutes  25  See all Orders from today for further details   Jeoffrey Massed M.D on 02/29/2020 at 2:56 PM  To page go to [email or web address] - use universal password  [facility] -  Office  [phone]    Objective:   Vitals:   02/28/20 1953 02/29/20 0000 02/29/20 0413 02/29/20 1449  BP: (!) 145/93 (!) 154/100 (!) 149/106 (!) 147/105  Pulse: 76 86 79 89  Resp: 18  17 19     Temp: 97.7 F (36.5 C)  98.7 F (37.1 C) (!) 97.5 F (36.4 C)  TempSrc: Oral  Oral Axillary  SpO2: 94% 94% 90% 92%  Weight:   80.5 kg   Height:        Wt Readings from Last 3 Encounters:  02/29/20 80.5 kg  04/30/15 89.8 kg  03/19/15 90.3 kg     Intake/Output Summary (Last 24 hours) at 02/29/2020 1456 Last data filed at 02/29/2020 1012 Gross per 24 hour  Intake 690 ml  Output 1150 ml  Net -460 ml     Physical Exam Gen Exam: Remains confused-but not in any distress. HEENT:atraumatic, normocephalic Chest: B/L clear to auscultation anteriorly CVS:S1S2 regular Abdomen:soft non tender, non distended Extremities:no edema Neurology: Non focal Skin: no rash   Data Review:    CBC Recent Labs  Lab 02/26/20 2234 02/28/20 0339 02/29/20 0303  WBC 5.8 7.2 7.9  HGB 17.1* 16.2 15.8  HCT 48.9 47.4 45.8  PLT 133* 182 210  MCV 91.4 91.7 91.2  MCH 32.0 31.3 31.5  MCHC 35.0 34.2 34.5  RDW 14.3 13.7 13.8  LYMPHSABS 1.2 1.6 1.4  MONOABS 0.7 1.1* 1.0  EOSABS 0.0 0.0 0.0  BASOSABS 0.0 0.0 0.0    Chemistries  Recent Labs  Lab 02/26/20 2234 02/28/20 03/01/20  02/29/20 0303  NA 135 137 139  K 4.4 3.3* 3.3*  CL 103 105 108  CO2 21* 22 22  GLUCOSE 121* 107* 110*  BUN 15 18 18   CREATININE 1.40* 1.12 1.13  CALCIUM 8.1* 8.3* 8.1*  AST 57* 28 25  ALT 25 20 22   ALKPHOS 51 48 47  BILITOT 2.0* 1.1 0.5   ------------------------------------------------------------------------------------------------------------------ No results for input(s): CHOL, HDL, LDLCALC, TRIG, CHOLHDL, LDLDIRECT in the last 72 hours.  Lab Results  Component Value Date   HGBA1C 5.6 02/26/2020   ------------------------------------------------------------------------------------------------------------------ Recent Labs    02/29/20 0303  TSH 0.631   ------------------------------------------------------------------------------------------------------------------ Recent Labs    02/28/20 0339  02/29/20 0303  VITAMINB12  --  391  FERRITIN 862* 961*    Coagulation profile No results for input(s): INR, PROTIME in the last 168 hours.  Recent Labs    02/28/20 0339 02/29/20 0303  DDIMER 0.79* 0.44    Cardiac Enzymes No results for input(s): CKMB, TROPONINI, MYOGLOBIN in the last 168 hours.  Invalid input(s): CK ------------------------------------------------------------------------------------------------------------------ No results found for: BNP  Micro Results Recent Results (from the past 240 hour(s))  Blood culture (routine x 2)     Status: None (Preliminary result)   Collection Time: 02/26/20 11:28 PM   Specimen: BLOOD  Result Value Ref Range Status   Specimen Description BLOOD RIGHT ARM  Final   Special Requests   Final    BOTTLES DRAWN AEROBIC AND ANAEROBIC Blood Culture adequate volume   Culture   Final    NO GROWTH 2 DAYS Performed at [facility], [address] [place], [address] [place]    Report Status PENDING  Incomplete  Blood culture (routine x 2)     Status: None (Preliminary result)   Collection Time: 02/26/20 11:39 PM   Specimen: BLOOD  Result Value Ref Range Status   Specimen Description BLOOD LEFT ARM  Final   Special Requests   Final    BOTTLES DRAWN AEROBIC AND ANAEROBIC Blood Culture adequate volume   Culture   Final    NO GROWTH 2 DAYS Performed at [facility], [address], [place], [address] [place]    Report Status PENDING  Incomplete    Radiology Reports CT Head Wo Contrast  Result Date: 02/27/2020 CLINICAL DATA:  Encephalopathy, weakness, COVID-19 positive EXAM: CT HEAD WITHOUT CONTRAST TECHNIQUE: Contiguous axial images were obtained from the base of the skull through the vertex without intravenous contrast. COMPARISON:  None. FINDINGS: Brain: Confluent hypodensities throughout the periventricular white matter are most consistent with chronic small vessel ischemic changes. No other signs of acute infarct or  hemorrhage. Lateral ventricles and remaining midline structures are unremarkable. No acute extra-axial fluid collections. No mass effect. Vascular: No hyperdense vessel or unexpected calcification. Skull: Normal. Negative for fracture or focal lesion. Sinuses/Orbits: No acute finding. Other: None. IMPRESSION: 1. 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 2. No acute intracranial process. Electronically Signed   By: [id number] M.D.   On: 02/27/2020 01:10   DG Chest Port 1 View  Result Date: 02/27/2020 CLINICAL DATA:  Possible sepsis EXAM: PORTABLE CHEST 1 VIEW COMPARISON:  None. FINDINGS: Increased opacity in the medial right lung base. Small left pleural effusion. Mild cardiomegaly. IMPRESSION: Increased opacity in the medial right lung base, which may indicate developing consolidation. Small left pleural effusion. Electronically Signed   By: 02/29/2020 M.D.   On: 02/27/2020 00:25

## 2020-02-29 NOTE — Plan of Care (Signed)

## 2020-03-01 LAB — COMPREHENSIVE METABOLIC PANEL
ALT: 28 U/L (ref 0–44)
AST: 33 U/L (ref 15–41)
Albumin: 2.7 g/dL — ABNORMAL LOW (ref 3.5–5.0)
Alkaline Phosphatase: 54 U/L (ref 38–126)
Anion gap: 12 (ref 5–15)
BUN: 17 mg/dL (ref 8–23)
CO2: 21 mmol/L — ABNORMAL LOW (ref 22–32)
Calcium: 8.3 mg/dL — ABNORMAL LOW (ref 8.9–10.3)
Chloride: 106 mmol/L (ref 98–111)
Creatinine, Ser: 1.19 mg/dL (ref 0.61–1.24)
GFR calc Af Amer: >60 mL/min (ref >60)
GFR calc non Af Amer: >60 mL/min (ref >60)
Glucose, Bld: 102 mg/dL — ABNORMAL HIGH (ref 70–99)
Potassium: 3.1 mmol/L — ABNORMAL LOW (ref 3.5–5.1)
Sodium: 139 mmol/L (ref 135–145)
Total Bilirubin: 0.8 mg/dL (ref 0.3–1.2)
Total Protein: 5.7 g/dL — ABNORMAL LOW (ref 6.5–8.1)

## 2020-03-01 LAB — CBC WITH DIFFERENTIAL/PLATELET
Abs Immature Granulocytes: 0.06 10*3/uL (ref 0.00–0.07)
Basophils Absolute: 0 10*3/uL (ref 0.0–0.1)
Basophils Relative: 0 %
Eosinophils Absolute: 0 10*3/uL (ref 0.0–0.5)
Eosinophils Relative: 0 %
HCT: 47.5 % (ref 39.0–52.0)
Hemoglobin: 16.5 g/dL (ref 13.0–17.0)
Immature Granulocytes: 1 %
Lymphocytes Relative: 20 %
Lymphs Abs: 1.6 10*3/uL (ref 0.7–4.0)
MCH: 32 pg (ref 26.0–34.0)
MCHC: 34.7 g/dL (ref 30.0–36.0)
MCV: 92.1 fL (ref 80.0–100.0)
Monocytes Absolute: 1 10*3/uL (ref 0.1–1.0)
Monocytes Relative: 13 %
Neutro Abs: 5.3 10*3/uL (ref 1.7–7.7)
Neutrophils Relative %: 66 %
Platelets: 244 10*3/uL (ref 150–400)
RBC: 5.16 MIL/uL (ref 4.22–5.81)
RDW: 13.8 % (ref 11.5–15.5)
WBC: 8 10*3/uL (ref 4.0–10.5)
nRBC: 0 % (ref 0.0–0.2)

## 2020-03-01 LAB — D-DIMER, QUANTITATIVE: D-Dimer, Quant: 0.46 ug/mL-FEU (ref 0.00–0.50)

## 2020-03-01 LAB — GLUCOSE, CAPILLARY
Glucose-Capillary: 105 mg/dL — ABNORMAL HIGH (ref 70–99)
Glucose-Capillary: 114 mg/dL — ABNORMAL HIGH (ref 70–99)
Glucose-Capillary: 127 mg/dL — ABNORMAL HIGH (ref 70–99)
Glucose-Capillary: 89 mg/dL (ref 70–99)

## 2020-03-01 LAB — FERRITIN: Ferritin: 971 ng/mL — ABNORMAL HIGH (ref 24–336)

## 2020-03-01 LAB — C-REACTIVE PROTEIN: CRP: 1 mg/dL — ABNORMAL HIGH (ref <1.0)

## 2020-03-01 MED ORDER — PNEUMOCOCCAL VAC POLYVALENT 25 MCG/0.5ML IJ INJ: 0.5000 mL | INJECTION | INTRAMUSCULAR | Status: DC

## 2020-03-01 MED ORDER — TRAZODONE HCL 50 MG PO TABS
50.0000 mg | ORAL_TABLET | Freq: Once | ORAL | Status: AC
  Administered 2020-03-02: 01:00:00 50 mg via ORAL
  Filled 2020-03-01: qty 1

## 2020-03-01 MED ORDER — POTASSIUM CHLORIDE CRYS ER 20 MEQ PO TBCR
40.0000 meq | EXTENDED_RELEASE_TABLET | ORAL | Status: AC
  Administered 2020-03-01 (×2): 40 meq via ORAL
  Filled 2020-03-01 (×2): qty 2

## 2020-03-01 NOTE — Progress Notes (Signed)
PROGRESS NOTE                                                                                                                                                                                                             Patient Demographics:    Vincent Cummings, is a 74 y.o. male, DOB - August 16, 1946, NIO:[id number]  Outpatient Primary MD for the patient is [name], MD   Admit date - 02/26/2020   LOS - 3  Chief Complaint  Patient presents with  . Altered Mental Status  . Weakness       Brief Narrative: Patient is a 74 y.o. male with PMHx of HTN, HLD-who was found confused and wandering outside of a local [place] restaurant-subsequently brought to the ED -further evaluation revealed COVID-19 pneumonia.  See below for further details.  Significant Events: 4/13>> brought to ED by EMS for weakness, confusion-found wandering outside a local [employer] since 4/11.  COVID-19 medications: Steroids:4/13>> Remdesivir: 4/13>>  Antibiotics: Zithromax: 4/13>>4/15 Rocephin: 4/14>>4/15 Vancomycin: 4/13 x 1 Zosyn: 4/13 x 1  Microbiology data: 4/13>> blood culture: No growth  DVT prophylaxis: SQ Lovenox  Procedures: None  Consults: None    Subjective:   Appears comfortable at rest-denies any chest pain or shortness of breath.  RN at bedside-still with significant amount of confusion-unable to tell me his exact age, when asked what his son does-he is not able to tell me today.  He is also not able to tell me who he lives with-he thinks about it and then looks at me and tells me it is not pertinent to his condition.  When asked if he knows where he is-he is not able to tell me that he is in the hospital-however knows he is in [place], when attempted to redirect to see if he can answer-he again tells me it is not pertinent.   Assessment  & Plan :   Acute Hypoxic Resp Failure due to Covid 19 Viral pneumonia:  Initially hypoxic when he first presented to the hospital-no oxygen requirements for the past few days.  Do not think patient has sepsis physiology.  Continue steroids/remdesivir-since procalcitonin levels not elevated-and due to low suspicion for bacterial pneumonia-Rocephin/Zithromax were discontinued.  Continue to clinical trajectory/inflammatory markers.  Fever: afebrile  O2 requirements:  SpO2: 95 % O2 Flow Rate (L/min): 2 L/min  COVID-19 Labs: Recent Labs    02/28/20 0339 02/29/20 0303 03/01/20 0827  DDIMER 0.79* 0.44 0.46  FERRITIN 862* 961* 971*  CRP 4.2* 1.7* 1.0*    No results found for: BNP  Recent Labs  Lab 02/28/20 0339  PROCALCITON <0.10    No results found for: SARSCOV2NAA   Prone/Incentive Spirometry: encouraged  incentive spirometry use 3-4/hour.  Likely secondary to acute metabolic encephalopathy superimposed on probable dementia/chronic paranoia: Suspect some amount of confusion probably was from metabolic encephalopathy in the setting of COVID-19 pneumonia.  Less confused and agitated compared to how he first presented to the hospital.  However he is still confused-he is not able to answer simple questions today-after thinking quite a bit-he is now telling me that it is not pertinent.  CT head without acute abnormalities, TSH, vitamin B12 within normal limits.  HIV/RPR nonreactive.  This MD spoke with patient's friend-Vincent Cummings on 4/16 (number in facesheet)-Vincent Cummings has known the patient for the past several years-Per [name] has had more confusion number issues for a few months prior to this hospitalization which apparently became more pronounced a few days before he was hospitalized.  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  It  appears that patient probably has dementia at this point-not sure if he was actually following up with his primary care practitioner this past several years.  Continue treatment of underlying Covid pneumonia to see if his encephalopathy continues to clear.  However at this point-it is unsafe for patient to be discharged to live by himself-as he is confused and he currently does not have capacity to make informed decisions.  AKI: Suspect this is hemodynamically mediated-resolved  Hypokalemia: Replete and recheck  HTN: Blood pressure relatively stable-continue amlodipine.  Follow and adjust.  GERD: Continue PPI  RN pressure injury documentation: Pressure Injury 02/27/20 Coccyx Stage 1 -  Intact skin with non-blanchable redness of a localized area usually over a bony prominence. (Active)  02/27/20 0500  Location: Coccyx  Location Orientation:   Staging: Stage 1 -  Intact skin with non-blanchable redness of a localized area usually over a bony prominence.  Wound Description (Comments):   Present on Admission: Yes    ABG: No results found for: PHART, PCO2ART, PO2ART, HCO3, TCO2, ACIDBASEDEF, O2SAT  Vent Settings: N/  Condition -  Guarded  Family Communication  : Vincent Cummings over the phone on 4/17  Code Status :  Full Code  Diet :  Diet Order            Diet Heart Room service appropriate? Yes; Fluid consistency: Thin  Diet effective now               Disposition Plan  :  Remain hospitalized  Barriers to discharge: Hypoxia requiring O2 supplementation/complete 5 days of IV Remdesivir  Antimicorbials  :    Anti-infectives (From admission, onward)   Start     Dose/Rate Route Frequency Ordered Stop   02/28/20 1000  remdesivir 100 mg in sodium chloride 0.9 % 100 mL IVPB     100 mg 200 mL/hr over 30 Minutes Intravenous Daily 02/27/20 0244 03/03/20 0959   02/27/20 0800  cefTRIAXone (ROCEPHIN) 1 g in sodium chloride 0.9 % 100 mL IVPB  Status:  Discontinued     1 g  200 mL/hr over  30 Minutes Intravenous Every 24 hours 02/27/20 0234 02/28/20 1409   02/27/20 0600  azithromycin (ZITHROMAX) 500 mg in sodium chloride 0.9 % 250 mL IVPB  Status:  Discontinued     500 mg 250 mL/hr over 60 Minutes Intravenous Daily 02/27/20 0234 02/28/20 1409   02/27/20 0330  remdesivir 100 mg in sodium chloride 0.9 % 100 mL IVPB     100 mg 200 mL/hr over 30 Minutes Intravenous Every 30 min 02/27/20 0244 02/27/20 0520   02/27/20 0000  piperacillin-tazobactam (ZOSYN) IVPB 3.375 g     3.375 g 100 mL/hr over 30 Minutes Intravenous  Once 02/26/20 2345 02/27/20 0124   02/27/20 0000  vancomycin (VANCOCIN) IVPB 1000 mg/200 mL premix     1,000 mg 200 mL/hr over 60 Minutes Intravenous  Once 02/26/20 2345 02/27/20 0200      Inpatient Medications  Scheduled Meds: . amLODipine  5 mg Oral Daily  . clotrimazole   Topical BID  . dexamethasone (DECADRON) injection  6 mg Intravenous Q24H  . enoxaparin (LOVENOX) injection  40 mg Subcutaneous Daily  . insulin aspart  0-15 Units Subcutaneous TID AC & HS  . pantoprazole  40 mg Oral Daily  . [START ON 03/05/2020] pneumococcal 23 valent vaccine  0.5 mL Intramuscular Tomorrow-1000   Continuous Infusions: . remdesivir 100 mg in NS 100 mL 100 mg (03/01/20 0925)   PRN Meds:.acetaminophen, albuterol, chlorpheniramine-HYDROcodone, guaiFENesin-dextromethorphan, hydrALAZINE, ondansetron **OR** ondansetron (ZOFRAN) IV, polyethylene glycol   Time Spent in minutes  25  See all Orders from today for further details   Oren Binet M.D on 03/01/2020 at 2:06 PM  To page go to [email or web address] - use universal password  [facility] -  Office  [phone]    Objective:   Vitals:   02/29/20 1934 02/29/20 2116 02/29/20 2127 03/01/20 0509  BP:  (!) 171/111 (!) 150/95 (!) 162/107  Pulse: 90 83 83 80  Resp:  17  19  Temp:  (!) 97.5 F (36.4 C)  97.8 F (36.6 C)  TempSrc:  Oral  Oral  SpO2: 92% 95% 93% 95%  Weight:      Height:        Wt Readings  from Last 3 Encounters:  02/29/20 80.5 kg  04/30/15 89.8 kg  03/19/15 90.3 kg     Intake/Output Summary (Last 24 hours) at 03/01/2020 1406 Last data filed at 03/01/2020 0600 Gross per 24 hour  Intake --  Output 1250 ml  Net -1250 ml     Physical Exam Gen Exam: Confused but not in any distress. HEENT:atraumatic, normocephalic Chest: B/L clear to auscultation anteriorly CVS:S1S2 regular Abdomen:soft non tender, non distended Extremities:no edema Neurology: Non focal Skin: no rash   Data Review:    CBC Recent Labs  Lab 02/26/20 2234 02/28/20 0339 02/29/20 0303 03/01/20 0827  WBC 5.8 7.2 7.9 8.0  HGB 17.1* 16.2 15.8 16.5  HCT 48.9 47.4 45.8 47.5  PLT 133* 182 210 244  MCV 91.4 91.7 91.2 92.1  MCH 32.0 31.3 31.5 32.0  MCHC 35.0 34.2 34.5 34.7  RDW 14.3 13.7 13.8 13.8  LYMPHSABS 1.2 1.6 1.4 1.6  MONOABS 0.7 1.1* 1.0 1.0  EOSABS 0.0 0.0 0.0 0.0  BASOSABS 0.0 0.0 0.0 0.0    Chemistries  Recent Labs  Lab 02/26/20 2234 02/28/20 0339 02/29/20 0303 03/01/20 0827  NA 135 137 139 139  K 4.4 3.3* 3.3* 3.1*  CL 103 105 108 106  CO2 21* 22 22 21*  GLUCOSE 121* 107* 110* 102*  BUN 15 18 18 17   CREATININE 1.40* 1.12 1.13 1.19  CALCIUM 8.1* 8.3* 8.1* 8.3*  AST 57* 28 25 33  ALT 25 20 22 28   ALKPHOS 51 48 47 54  BILITOT 2.0* 1.1 0.5 0.8   ------------------------------------------------------------------------------------------------------------------ No results for input(s): CHOL, HDL, LDLCALC, TRIG, CHOLHDL, LDLDIRECT in the last 72 hours.  Lab Results  Component Value Date   HGBA1C 5.6 02/26/2020   ------------------------------------------------------------------------------------------------------------------ Recent Labs    02/29/20 0303  TSH 0.631   ------------------------------------------------------------------------------------------------------------------ Recent Labs    02/29/20 0303 03/01/20 0827  VITAMINB12 391  --   FERRITIN 961* 971*     Coagulation profile No results for input(s): INR, PROTIME in the last 168 hours.  Recent Labs    02/29/20 0303 03/01/20 0827  DDIMER 0.44 0.46    Cardiac Enzymes No results for input(s): CKMB, TROPONINI, MYOGLOBIN in the last 168 hours.  Invalid input(s): CK ------------------------------------------------------------------------------------------------------------------ No results found for: BNP  Micro Results Recent Results (from the past 240 hour(s))  Blood culture (routine x 2)     Status: None (Preliminary result)   Collection Time: 02/26/20 11:28 PM   Specimen: BLOOD  Result Value Ref Range Status   Specimen Description BLOOD RIGHT ARM  Final   Special Requests   Final    BOTTLES DRAWN AEROBIC AND ANAEROBIC Blood Culture adequate volume   Culture   Final    NO GROWTH 3 DAYS Performed at [facility], [address], [place], [address] [place]    Report Status PENDING  Incomplete  Blood culture (routine x 2)     Status: None (Preliminary result)   Collection Time: 02/26/20 11:39 PM   Specimen: BLOOD  Result Value Ref Range Status   Specimen Description BLOOD LEFT ARM  Final   Special Requests   Final    BOTTLES DRAWN AEROBIC AND ANAEROBIC Blood Culture adequate volume   Culture   Final    NO GROWTH 3 DAYS Performed at [facility], [address], [place], [address] [place]    Report Status PENDING  Incomplete    Radiology Reports CT Head Wo Contrast  Result Date: 02/27/2020 CLINICAL DATA:  Encephalopathy, weakness, COVID-19 positive EXAM: CT HEAD WITHOUT CONTRAST TECHNIQUE: Contiguous axial images were obtained from the base of the skull through the vertex without intravenous contrast. COMPARISON:  None. FINDINGS: Brain: Confluent hypodensities throughout the periventricular white matter are most consistent with chronic small vessel ischemic changes. No other signs of acute infarct or hemorrhage. Lateral ventricles and remaining midline  structures are unremarkable. No acute extra-axial fluid collections. No mass effect. Vascular: No hyperdense vessel or unexpected calcification. Skull: Normal. Negative for fracture or focal lesion. Sinuses/Orbits: No acute finding. Other: None. IMPRESSION: 1. 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 2. No acute intracranial process. Electronically Signed   By: [id number] M.D.   On: 02/27/2020 01:10   DG Chest Port 1 View  Result Date: 02/27/2020 CLINICAL DATA:  Possible sepsis EXAM: PORTABLE CHEST 1 VIEW COMPARISON:  None. FINDINGS: Increased opacity in the medial right lung base. Small left pleural effusion. Mild cardiomegaly. IMPRESSION: Increased opacity in the medial right lung base, which may indicate developing consolidation. Small left pleural effusion. Electronically Signed   By: 02/29/2020 M.D.   On: 02/27/2020 00:25

## 2020-03-01 NOTE — Plan of Care (Signed)

## 2020-03-02 LAB — COMPREHENSIVE METABOLIC PANEL
ALT: 38 U/L (ref 0–44)
AST: 39 U/L (ref 15–41)
Albumin: 3.2 g/dL — ABNORMAL LOW (ref 3.5–5.0)
Alkaline Phosphatase: 67 U/L (ref 38–126)
Anion gap: 12 (ref 5–15)
BUN: 17 mg/dL (ref 8–23)
CO2: 20 mmol/L — ABNORMAL LOW (ref 22–32)
Calcium: 9 mg/dL (ref 8.9–10.3)
Chloride: 108 mmol/L (ref 98–111)
Creatinine, Ser: 1.19 mg/dL (ref 0.61–1.24)
GFR calc Af Amer: >60 mL/min (ref >60)
GFR calc non Af Amer: >60 mL/min (ref >60)
Glucose, Bld: 109 mg/dL — ABNORMAL HIGH (ref 70–99)
Potassium: 3.9 mmol/L (ref 3.5–5.1)
Sodium: 140 mmol/L (ref 135–145)
Total Bilirubin: 1.3 mg/dL — ABNORMAL HIGH (ref 0.3–1.2)
Total Protein: 7.3 g/dL (ref 6.5–8.1)

## 2020-03-02 LAB — CBC WITH DIFFERENTIAL/PLATELET
Abs Immature Granulocytes: 0.19 10*3/uL — ABNORMAL HIGH (ref 0.00–0.07)
Basophils Absolute: 0.1 10*3/uL (ref 0.0–0.1)
Basophils Relative: 1 %
Eosinophils Absolute: 0 10*3/uL (ref 0.0–0.5)
Eosinophils Relative: 0 %
HCT: 55.1 % — ABNORMAL HIGH (ref 39.0–52.0)
Hemoglobin: 19.2 g/dL — ABNORMAL HIGH (ref 13.0–17.0)
Immature Granulocytes: 2 %
Lymphocytes Relative: 15 %
Lymphs Abs: 1.8 10*3/uL (ref 0.7–4.0)
MCH: 31.9 pg (ref 26.0–34.0)
MCHC: 34.8 g/dL (ref 30.0–36.0)
MCV: 91.7 fL (ref 80.0–100.0)
Monocytes Absolute: 1.3 10*3/uL — ABNORMAL HIGH (ref 0.1–1.0)
Monocytes Relative: 10 %
Neutro Abs: 9.1 10*3/uL — ABNORMAL HIGH (ref 1.7–7.7)
Neutrophils Relative %: 72 %
Platelets: 330 10*3/uL (ref 150–400)
RBC: 6.01 MIL/uL — ABNORMAL HIGH (ref 4.22–5.81)
RDW: 13.8 % (ref 11.5–15.5)
WBC: 12.5 10*3/uL — ABNORMAL HIGH (ref 4.0–10.5)
nRBC: 0 % (ref 0.0–0.2)

## 2020-03-02 LAB — GLUCOSE, CAPILLARY
Glucose-Capillary: 101 mg/dL — ABNORMAL HIGH (ref 70–99)
Glucose-Capillary: 132 mg/dL — ABNORMAL HIGH (ref 70–99)
Glucose-Capillary: 119 mg/dL — ABNORMAL HIGH (ref 70–99)
Glucose-Capillary: 118 mg/dL — ABNORMAL HIGH (ref 70–99)

## 2020-03-02 LAB — C-REACTIVE PROTEIN: CRP: 0.9 mg/dL (ref <1.0)

## 2020-03-02 LAB — FERRITIN: Ferritin: 1162 ng/mL — ABNORMAL HIGH (ref 24–336)

## 2020-03-02 LAB — D-DIMER, QUANTITATIVE: D-Dimer, Quant: 0.72 ug/mL-FEU — ABNORMAL HIGH (ref 0.00–0.50)

## 2020-03-02 NOTE — Progress Notes (Signed)
PROGRESS NOTE                                                                                                                                                                                                             Patient Demographics:    Vincent Cummings, is a 74 y.o. male, DOB - 1946/10/25, XNA:[id number]  Outpatient Primary MD for the patient is [name], MD   Admit date - 02/26/2020   LOS - 4  Chief Complaint  Patient presents with  . Altered Mental Status  . Weakness       Brief Narrative: Patient is a 74 y.o. male with PMHx of HTN, HLD-who was found confused and wandering outside of a local [place] restaurant-subsequently brought to the ED -further evaluation revealed COVID-19 pneumonia.  See below for further details.  Significant Events: 4/13>> brought to ED by EMS for weakness, confusion-found wandering outside a local [employer] since 4/11.  COVID-19 medications: Steroids:4/13>> 4/18 Remdesivir: 4/13>> 4/18  Antibiotics: Zithromax: 4/13>>4/15 Rocephin: 4/14>>4/15 Vancomycin: 4/13 x 1 Zosyn: 4/13 x 1  Microbiology data: 4/13>> blood culture: No growth  DVT prophylaxis: SQ Lovenox  Procedures: None  Consults: None    Subjective:   Lying comfortably in bed-does not want the blinds open.  Still does not know how old he is-he thinks about it and then proceeds to tell me "it does not matter".  Tells me that today-his son [name] lives close by [name] is in [place] with the [employer])   Assessment  & Plan :   Acute Hypoxic Resp Failure due to Covid 19 Viral pneumonia: Initially hypoxic when he first presented to the hospital-no oxygen requirements for the past few days.  Do not think patient has sepsis physiology.  Will complete a course of steroids and remdesivir today.  Due to  low suspicion for bacterial pneumonia-Rocephin/Zithromax were discontinued.  Continue to clinical  trajectory/inflammatory markers.  Fever: afebrile  O2 requirements:  SpO2: 94 % O2 Flow Rate (L/min): 2 L/min   COVID-19 Labs: Recent Labs    02/29/20 0303 03/01/20 0827 03/02/20 0453  DDIMER 0.44 0.46 0.72*  FERRITIN 961* 971* 1,162*  CRP 1.7* 1.0* 0.9    No results found for: BNP  Recent Labs  Lab 02/28/20 0339  PROCALCITON <0.10    No results found for: SARSCOV2NAA  Prone/Incentive Spirometry: encouraged  incentive spirometry use 3-4/hour.  Likely secondary to acute metabolic encephalopathy superimposed on probable dementia/chronic paranoia: Suspect some amount of confusion probably was from metabolic encephalopathy in the setting of COVID-19 pneumonia.  Less confused and agitated compared to how he first presented to the hospital.  However he is still confused-he is not able to answer simple questions today-after thinking quite a bit-he is now telling me that it is not pertinent.  CT head without acute abnormalities, TSH, vitamin B12 within normal limits.  HIV/RPR nonreactive.  This MD spoke with patient's friend-[name] on 4/16 (number in facesheet)-[name] has known the patient for the past several years-Per [name] has had more confusion number issues for a few months prior to this hospitalization which apparently became more pronounced a few days before he was hospitalized.  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  It appears that patient probably has dementia at this point-not sure if he was actually following up with his primary care practitioner this past several years.  Continue treatment of underlying Covid pneumonia to see if his encephalopathy continues to clear.  However at this point-it is unsafe for patient to be discharged to  live by himself-as he is confused and he currently does not have capacity to make informed decisions.  AKI: Suspect this is hemodynamically mediated-resolved  Hypokalemia: Replete and recheck  HTN: Blood pressure relatively stable-continue amlodipine.  Follow and adjust.  GERD: Continue PPI  RN pressure injury documentation: Pressure Injury 02/27/20 Coccyx Stage 1 -  Intact skin with non-blanchable redness of a localized area usually over a bony prominence. (Active)  02/27/20 0500  Location: Coccyx  Location Orientation:   Staging: Stage 1 -  Intact skin with non-blanchable redness of a localized area usually over a bony prominence.  Wound Description (Comments):   Present on Admission: Yes    ABG: No results found for: PHART, PCO2ART, PO2ART, HCO3, TCO2, ACIDBASEDEF, O2SAT  Vent Settings: N/  Condition -  Guarded  Family Communication  : [name] over the phone on 4/18  Code Status :  Full Code  Diet :  Diet Order            Diet Heart Room service appropriate? Yes; Fluid consistency: Thin  Diet effective now               Disposition Plan  :  Remain hospitalized-probably requires SNF  Barriers to discharge: Hypoxia requiring O2 supplementation/complete 5 days of IV Remdesivir/SNF placement due to ongoing confusion  Antimicorbials  :    Anti-infectives (From admission, onward)   Start     Dose/Rate Route Frequency Ordered Stop   02/28/20 1000  remdesivir 100 mg in sodium chloride 0.9 % 100 mL IVPB     100 mg 200 mL/hr over 30 Minutes Intravenous Daily 02/27/20 0244 03/02/20 0937   02/27/20 0800  cefTRIAXone (ROCEPHIN) 1 g in sodium chloride 0.9 % 100 mL IVPB  Status:  Discontinued     1 g 200 mL/hr over 30 Minutes Intravenous Every 24 hours 02/27/20 0234 02/28/20 1409   02/27/20 0600  azithromycin (ZITHROMAX) 500 mg in sodium chloride 0.9 % 250 mL IVPB  Status:  Discontinued     500 mg 250 mL/hr over 60 Minutes Intravenous Daily 02/27/20 0234  02/28/20 1409    02/27/20 0330  remdesivir 100 mg in sodium chloride 0.9 % 100 mL IVPB     100 mg 200 mL/hr over 30 Minutes Intravenous Every 30 min 02/27/20 0244 02/27/20 0520   02/27/20 0000  piperacillin-tazobactam (ZOSYN) IVPB 3.375 g     3.375 g 100 mL/hr over 30 Minutes Intravenous  Once 02/26/20 2345 02/27/20 0124   02/27/20 0000  vancomycin (VANCOCIN) IVPB 1000 mg/200 mL premix     1,000 mg 200 mL/hr over 60 Minutes Intravenous  Once 02/26/20 2345 02/27/20 0200      Inpatient Medications  Scheduled Meds: . amLODipine  5 mg Oral Daily  . clotrimazole   Topical BID  . dexamethasone (DECADRON) injection  6 mg Intravenous Q24H  . enoxaparin (LOVENOX) injection  40 mg Subcutaneous Daily  . insulin aspart  0-15 Units Subcutaneous TID AC & HS  . pantoprazole  40 mg Oral Daily  . [START ON 03/05/2020] pneumococcal 23 valent vaccine  0.5 mL Intramuscular Tomorrow-1000   Continuous Infusions:  PRN Meds:.acetaminophen, albuterol, chlorpheniramine-HYDROcodone, guaiFENesin-dextromethorphan, hydrALAZINE, ondansetron **OR** ondansetron (ZOFRAN) IV, polyethylene glycol   Time Spent in minutes  25  See all Orders from today for further details   Jeoffrey Massed M.D on 03/02/2020 at 12:06 PM  To page go to [email or web address] - use universal password  [facility] -  Office  [phone]    Objective:   Vitals:   03/02/20 0200 03/02/20 0300 03/02/20 0400 03/02/20 0425  BP: 134/86 (!) 171/94  (!) 141/94  Pulse: 76  81 88  Resp:    14  Temp:    (!) 97.4 F (36.3 C)  TempSrc:    Oral  SpO2: 99%  91% 94%  Weight:      Height:        Wt Readings from Last 3 Encounters:  02/29/20 80.5 kg  04/30/15 89.8 kg  03/19/15 90.3 kg    No intake or output data in the 24 hours ending 03/02/20 1206   Physical Exam Gen Exam: Confused-but not in any distress. HEENT:atraumatic, normocephalic Chest: B/L clear to auscultation anteriorly CVS:S1S2 regular Abdomen:soft non tender, non  distended Extremities:no edema Neurology: Non focal Skin: no rash   Data Review:    CBC Recent Labs  Lab 02/26/20 2234 02/28/20 0339 02/29/20 0303 03/01/20 0827 03/02/20 0453  WBC 5.8 7.2 7.9 8.0 12.5*  HGB 17.1* 16.2 15.8 16.5 19.2*  HCT 48.9 47.4 45.8 47.5 55.1*  PLT 133* 182 210 244 330  MCV 91.4 91.7 91.2 92.1 91.7  MCH 32.0 31.3 31.5 32.0 31.9  MCHC 35.0 34.2 34.5 34.7 34.8  RDW 14.3 13.7 13.8 13.8 13.8  LYMPHSABS 1.2 1.6 1.4 1.6 1.8  MONOABS 0.7 1.1* 1.0 1.0 1.3*  EOSABS 0.0 0.0 0.0 0.0 0.0  BASOSABS 0.0 0.0 0.0 0.0 0.1    Chemistries  Recent Labs  Lab 02/26/20 2234 02/28/20 0339 02/29/20 0303 03/01/20 0827 03/02/20 0453  NA 135 137 139 139 140  K 4.4 3.3* 3.3* 3.1* 3.9  CL 103 105 108 106 108  CO2 21* 22 22 21* 20*  GLUCOSE 121* 107* 110* 102* 109*  BUN 15 18 18 17 17   CREATININE 1.40* 1.12 1.13 1.19 1.19  CALCIUM 8.1* 8.3* 8.1* 8.3* 9.0  AST 57* 28 25 33 39  ALT 25 20 22 28  38  ALKPHOS 51 48 47 54 67  BILITOT 2.0* 1.1 0.5 0.8 1.3*   ------------------------------------------------------------------------------------------------------------------ No results for input(s): CHOL, HDL, LDLCALC, TRIG, CHOLHDL,  LDLDIRECT in the last 72 hours.  Lab Results  Component Value Date   HGBA1C 5.6 02/26/2020   ------------------------------------------------------------------------------------------------------------------ Recent Labs    02/29/20 0303  TSH 0.631   ------------------------------------------------------------------------------------------------------------------ Recent Labs    02/29/20 0303 02/29/20 0303 03/01/20 0827 03/02/20 0453  VITAMINB12 391  --   --   --   FERRITIN 961*   < > 971* 1,162*   < > = values in this interval not displayed.    Coagulation profile No results for input(s): INR, PROTIME in the last 168 hours.  Recent Labs    03/01/20 0827 03/02/20 0453  DDIMER 0.46 0.72*    Cardiac Enzymes No results for  input(s): CKMB, TROPONINI, MYOGLOBIN in the last 168 hours.  Invalid input(s): CK ------------------------------------------------------------------------------------------------------------------ No results found for: BNP  Micro Results Recent Results (from the past 240 hour(s))  Blood culture (routine x 2)     Status: None (Preliminary result)   Collection Time: 02/26/20 11:28 PM   Specimen: BLOOD  Result Value Ref Range Status   Specimen Description BLOOD RIGHT ARM  Final   Special Requests   Final    BOTTLES DRAWN AEROBIC AND ANAEROBIC Blood Culture adequate volume   Culture   Final    NO GROWTH 4 DAYS Performed at [facility], [place] [address], [place], [place] [address]    Report Status PENDING  Incomplete  Blood culture (routine x 2)     Status: None (Preliminary result)   Collection Time: 02/26/20 11:39 PM   Specimen: BLOOD  Result Value Ref Range Status   Specimen Description BLOOD LEFT ARM  Final   Special Requests   Final    BOTTLES DRAWN AEROBIC AND ANAEROBIC Blood Culture adequate volume   Culture   Final    NO GROWTH 4 DAYS Performed at [facility], [place] [address], [place], [place] [address]    Report Status PENDING  Incomplete    Radiology Reports CT Head Wo Contrast  Result Date: 02/27/2020 CLINICAL DATA:  Encephalopathy, weakness, COVID-19 positive EXAM: CT HEAD WITHOUT CONTRAST TECHNIQUE: Contiguous axial images were obtained from the base of the skull through the vertex without intravenous contrast. COMPARISON:  None. FINDINGS: Brain: Confluent hypodensities throughout the periventricular white matter are most consistent with chronic small vessel ischemic changes. No other signs of acute infarct or hemorrhage. Lateral ventricles and remaining midline structures are unremarkable. No acute extra-axial fluid collections. No mass effect. Vascular: No hyperdense vessel or unexpected calcification. Skull: Normal. Negative for fracture or focal lesion.  Sinuses/Orbits: No acute finding. Other: None. IMPRESSION: 1. 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 2. No acute intracranial process. Electronically Signed   By: Randa Ngo M.D.   On: 02/27/2020 01:10   DG Chest Port 1 View  Result Date: 02/27/2020 CLINICAL DATA:  Possible sepsis EXAM: PORTABLE CHEST 1 VIEW COMPARISON:  None. FINDINGS: Increased opacity in the medial right lung base. Small left pleural effusion. Mild cardiomegaly. IMPRESSION: Increased opacity in the medial right lung base, which may indicate developing consolidation. Small left pleural effusion. Electronically Signed   By: Ulyses Jarred M.D.   On: 02/27/2020 00:25

## 2020-03-03 LAB — CULTURE, BLOOD (ROUTINE X 2)
Culture: NO GROWTH
Culture: NO GROWTH
Special Requests: ADEQUATE
Special Requests: ADEQUATE

## 2020-03-03 LAB — GLUCOSE, CAPILLARY
Glucose-Capillary: 126 mg/dL — ABNORMAL HIGH (ref 70–99)
Glucose-Capillary: 153 mg/dL — ABNORMAL HIGH (ref 70–99)
Glucose-Capillary: 90 mg/dL (ref 70–99)
Glucose-Capillary: 94 mg/dL (ref 70–99)

## 2020-03-03 MED ORDER — LORAZEPAM 2 MG/ML IJ SOLN
0.5000 mg | Freq: Once | INTRAMUSCULAR | Status: AC
  Administered 2020-03-03: 0.5 mg via INTRAVENOUS
  Filled 2020-03-03: qty 1

## 2020-03-03 NOTE — Progress Notes (Signed)
Physical Therapy Treatment Patient Details Name: Vincent Cummings MRN: [id number] DOB: 18-Apr-1946 Today's Date: 03/03/2020    History of Present Illness 74 year old male who was found confused and wandering outside of a local [employer]. He was brought to the ED 4/13/2 and found to have COVID-19 pneumonia. PMHx of HTN, HLD     PT Comments    Patient continues to demonstrate impaired cognition, decreased insight into deficits, impaired balance, and overall decreased strength. He has difficulty executing and completing tasks and requires max cues with assistance. Patient is unsteady in standing and at risk for falls. Recommend continued skilled PT services and discharge to SNF for short term rehabilitation.   Follow Up Recommendations  SNF;Supervision/Assistance - 24 hour     Equipment Recommendations  Rolling walker with 5" wheels;3in1 (PT)       Precautions / Restrictions Precautions Precautions: Fall;Other (comment) Precaution Comments: virtual sitter Restrictions Weight Bearing Restrictions: No    Mobility  Bed Mobility Overal bed mobility: Needs Assistance Bed Mobility: Supine to Sit;Sit to Supine((long sitting to sitting EOB))     Supine to sit: Min assist Sit to supine: Min assist   General bed mobility comments: tactile, verbal, visual cues required with many attempts to reach success  Transfers Overall transfer level: Needs assistance Equipment used: None;Rolling walker (2 wheeled) Transfers: Sit to/from Stand Sit to Stand: Min assist;Mod assist Stand pivot transfers: Mod assist       General transfer comment: Cues for safe transfer technique and for sequencing. Sit>stand from EOB with RW, stnad>sit on chair with RW, stand-step transfer chair>EOB with modA  Ambulation/Gait Ambulation/Gait assistance: Min assist Gait Distance (Feet): 30 Feet Assistive device: Rolling walker (2 wheeled) Gait Pattern/deviations: Decreased step length - right;Decreased  step length - left;Trunk flexed Gait velocity: decreased   General Gait Details: Patient requires assistance for RW management and for negotiation around room.   Stairs Stairs: (not safe to attempt at this time)     Balance Overall balance assessment: Needs assistance Sitting-balance support: No upper extremity supported;Feet supported Sitting balance-Leahy Scale: Fair Sitting balance - Comments: supervision for safety due to patient's cognition   Standing balance support: Bilateral upper extremity supported;No upper extremity supported Standing balance-Leahy Scale: Poor Standing balance comment: contact guard static standing balance with RW, minA static standing balance without RW    Cognition Arousal/Alertness: Awake/alert Behavior During Therapy: Impulsive Overall Cognitive Status: No family/caregiver present to determine baseline cognitive functioning    Orientation Level: Disoriented to;Time(oriented to "hospital" once given choices)   Memory: Decreased short-term memory Following Commands: Follows one step commands inconsistently Safety/Judgement: Decreased awareness of safety;Decreased awareness of deficits   Problem Solving: Difficulty sequencing;Requires verbal cues;Requires tactile cues General Comments: Patient thinks he has been in the hospital for 3 weeks. He knows he is in [place], [place] but unable to state that he is in the hospital unless given a list of choices.         General Comments General comments (skin integrity, edema, etc.): Patient incontinent of urine upon long sitting to progressing toward EOB. Assisted patient with changing gown and nurse tech assisted with changing bed linens. Patient on room air. Questionable desat with mobility as it was not sustained >10 seconds and quickly recovered to 92%.      Pertinent Vitals/Pain Pain Assessment: No/denies pain           PT Goals (current goals can now be found in the care plan section) Progress  towards PT goals: Progressing  toward goals    Frequency    Min 2X/week      PT Plan Current plan remains appropriate       AM-PAC PT "6 Clicks" Mobility   Outcome Measure  Help needed turning from your back to your side while in a flat bed without using bedrails?: A Little Help needed moving from lying on your back to sitting on the side of a flat bed without using bedrails?: A Little Help needed moving to and from a bed to a chair (including a wheelchair)?: A Little Help needed standing up from a chair using your arms (e.g., wheelchair or bedside chair)?: A Lot Help needed to walk in hospital room?: A Little Help needed climbing 3-5 steps with a railing? : A Lot 6 Click Score: 16    End of Session Equipment Utilized During Treatment: Gait belt Activity Tolerance: Patient limited by fatigue Patient left: in bed;with call bell/phone within reach;with bed alarm set(virtual sitter, rails up) Nurse Communication: Mobility status PT Visit Diagnosis: Unsteadiness on feet (R26.81);Difficulty in walking, not elsewhere classified (R26.2)     Time: 2637-8588 PT Time Calculation (min) (ACUTE ONLY): 35 min  Charges:  $Gait Training: 8-22 mins $Therapeutic Activity: 8-22 mins                     Birdie Hopes, PT, DPT Acute Rehab [phone] office     Birdie Hopes 03/03/2020, 3:44 PM

## 2020-03-03 NOTE — Progress Notes (Signed)
PROGRESS NOTE                                                                                                                                                                                                             Patient Demographics:    Vincent Cummings, is a 74 y.o. male, DOB - 1946-10-23, YIA:[id number]  Outpatient Primary MD for the patient is [name], MD   Admit date - 02/26/2020   LOS - 5  Chief Complaint  Patient presents with  . Altered Mental Status  . Weakness       Brief Narrative: Patient is a 74 y.o. male with PMHx of HTN, HLD-who was found confused and wandering outside of a local [place] restaurant-subsequently brought to the ED -further evaluation revealed COVID-19 pneumonia.  See below for further details.  Significant Events: 4/13>> brought to ED by EMS for weakness, confusion-found wandering outside a local [employer] since 4/11.  COVID-19 medications: Steroids:4/13>> 4/18 Remdesivir: 4/13>> 4/18  Antibiotics: Zithromax: 4/13>>4/15 Rocephin: 4/14>>4/15 Vancomycin: 4/13 x 1 Zosyn: 4/13 x 1  Microbiology data: 4/13>> blood culture: No growth  DVT prophylaxis: SQ Lovenox  Procedures: None  Consults: None    Subjective:   No major issues overnight per RN.  Sleeping soundly when I walked in-he is still confused-still continues to think that Vincent Cummings (his son) is in town (stationed in [place] with the [employer]).  He does not know he is in the hospital this morning, not able to tell me his age, not able to tell me today's date or year.    Assessment  & Plan :   Acute Hypoxic Resp Failure due to Covid 19 Viral pneumonia: Initially hypoxic when he first presented to the hospital-no oxygen requirements for the past few days.  Do not think patient has sepsis physiology.  Will complete a course of steroids and remdesivir today.  Due to  low suspicion for bacterial  pneumonia-Rocephin/Zithromax were discontinued.  Continue to clinical trajectory/inflammatory markers.  Fever: afebrile  O2 requirements:  SpO2: 100 % O2 Flow Rate (L/min): 2 L/min   COVID-19 Labs: Recent Labs    03/01/20 0827 03/02/20 0453  DDIMER 0.46 0.72*  FERRITIN 971* 1,162*  CRP 1.0* 0.9    No results found for: BNP  Recent Labs  Lab 02/28/20 0339  PROCALCITON <0.10  No results found for: SARSCOV2NAA   Prone/Incentive Spirometry: encouraged  incentive spirometry use 3-4/hour.  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  I have spoken with the patient's son on multiple occasions over the phone, and also wants with a friend of the patient [name] on 4/16 we did confirm that patient has had memory issues over the past several months (see prior notes).  This morning-patient remains confused and essentially remains the same-at this time he is not deemed to have any capacity to make informed decisions.  Social worker following-in touch with the patient's son-plans are for SNF on discharge.  Note-CT head without acute abnormalities.TSH, vitamin B12 within normal limits.  RPR nonreactive.  Further work-up/treatment of dementia deferred to the outpatient setting.  AKI: Suspect this is hemodynamically mediated-resolved  Hypokalemia: Repleted  HTN: Blood pressure relatively stable-continue amlodipine.  Follow and adjust.  GERD: Continue PPI  RN pressure injury documentation: Pressure Injury 02/27/20 Coccyx Stage 1 -  Intact skin with non-blanchable redness of a localized area usually over a bony prominence. (Active)  02/27/20 0500  Location: Coccyx  Location Orientation:   Staging: Stage 1 -  Intact  skin with non-blanchable redness of a localized area usually over a bony prominence.  Wound Description (Comments):   Present on Admission: Yes    ABG: No results found for: PHART, PCO2ART, PO2ART, HCO3, TCO2, ACIDBASEDEF, O2SAT  Vent Settings: N/  Condition -  Guarded  Family Communication  : Vincent Cummings over the phone on 4/18-we will update family over the next few days  Code Status :  Full Code  Diet :  Diet Order            Diet Heart Room service appropriate? Yes; Fluid consistency: Thin  Diet effective now               Disposition Plan  :  Remain hospitalized-probably requires SNF  Barriers to discharge: Awaiting SNF placement  Antimicorbials  :    Anti-infectives (From admission, onward)   Start     Dose/Rate Route Frequency Ordered Stop   02/28/20 1000  remdesivir 100 mg in sodium chloride 0.9 % 100 mL IVPB     100 mg 200 mL/hr over 30 Minutes Intravenous Daily 02/27/20 0244 03/02/20 0937   02/27/20 0800  cefTRIAXone (ROCEPHIN) 1 g in sodium chloride 0.9 % 100 mL IVPB  Status:  Discontinued     1 g 200 mL/hr over 30 Minutes Intravenous Every 24 hours 02/27/20 0234 02/28/20 1409   02/27/20 0600  azithromycin (ZITHROMAX) 500 mg in sodium chloride 0.9 % 250 mL IVPB  Status:  Discontinued     500 mg 250 mL/hr over 60 Minutes Intravenous Daily 02/27/20 0234 02/28/20 1409   02/27/20 0330  remdesivir 100 mg in sodium chloride 0.9 % 100 mL IVPB     100 mg 200 mL/hr over 30 Minutes Intravenous Every 30 min 02/27/20 0244 02/27/20 0520   02/27/20 0000  piperacillin-tazobactam (ZOSYN) IVPB 3.375 g     3.375 g 100 mL/hr over 30 Minutes Intravenous  Once 02/26/20 2345 02/27/20 0124   02/27/20 0000  vancomycin (VANCOCIN) IVPB 1000 mg/200 mL premix     1,000 mg 200 mL/hr over 60 Minutes Intravenous  Once 02/26/20 2345 02/27/20 0200      Inpatient Medications  Scheduled Meds: . amLODipine  5 mg Oral Daily  . clotrimazole   Topical BID  . enoxaparin (LOVENOX) injection   40 mg Subcutaneous Daily  . insulin aspart  0-15 Units Subcutaneous TID AC & HS  . pantoprazole  40 mg Oral Daily  . [START ON 03/05/2020] pneumococcal 23 valent vaccine  0.5 mL Intramuscular Tomorrow-1000   Continuous Infusions:  PRN Meds:.acetaminophen, albuterol, chlorpheniramine-HYDROcodone, guaiFENesin-dextromethorphan, hydrALAZINE, ondansetron **OR** ondansetron (ZOFRAN) IV, polyethylene glycol   Time Spent in minutes  25  See all Orders from today for further details   Oren Binet M.D on 03/03/2020 at 1:42 PM  To page go to [email or web address] - use universal password  [facility] -  Office  [phone]    Objective:   Vitals:   03/02/20 0425 03/02/20 1700 03/02/20 2021 03/03/20 1200  BP: (!) 141/94 (!) 154/105 (!) 144/103 (!) 129/91  Pulse: 88 86 74 85  Resp: 14  20 18   Temp: (!) 97.4 F (36.3 C) 97.8 F (36.6 C) 97.7 F (36.5 C) 97.8 F (36.6 C)  TempSrc: Oral Oral Oral Oral  SpO2: 94% 99% 97% 100%  Weight:      Height:        Wt Readings from Last 3 Encounters:  02/29/20 80.5 kg  04/30/15 89.8 kg  03/19/15 90.3 kg    No intake or output data in the 24 hours ending 03/03/20 1342   Physical Exam Gen Exam:Alert awake-not in any distress HEENT:atraumatic, normocephalic Chest: B/L clear to auscultation anteriorly CVS:S1S2 regular Abdomen:soft non tender, non distended Extremities:no edema Neurology: Non focal Skin: no rash   Data Review:    CBC Recent Labs  Lab 02/26/20 2234 02/28/20 0339 02/29/20 0303 03/01/20 0827 03/02/20 0453  WBC 5.8 7.2 7.9 8.0 12.5*  HGB 17.1* 16.2 15.8 16.5 19.2*  HCT 48.9 47.4 45.8 47.5 55.1*  PLT 133* 182 210 244 330  MCV 91.4 91.7 91.2 92.1 91.7  MCH 32.0 31.3 31.5 32.0 31.9  MCHC 35.0 34.2 34.5 34.7 34.8  RDW 14.3 13.7 13.8 13.8 13.8  LYMPHSABS 1.2 1.6 1.4 1.6 1.8  MONOABS 0.7 1.1* 1.0 1.0 1.3*  EOSABS 0.0 0.0 0.0 0.0 0.0  BASOSABS 0.0 0.0 0.0 0.0 0.1    Chemistries  Recent Labs  Lab  02/26/20 2234 02/28/20 0339 02/29/20 0303 03/01/20 0827 03/02/20 0453  NA 135 137 139 139 140  K 4.4 3.3* 3.3* 3.1* 3.9  CL 103 105 108 106 108  CO2 21* 22 22 21* 20*  GLUCOSE 121* 107* 110* 102* 109*  BUN 15 18 18 17 17   CREATININE 1.40* 1.12 1.13 1.19 1.19  CALCIUM 8.1* 8.3* 8.1* 8.3* 9.0  AST 57* 28 25 33 39  ALT 25 20 22 28  38  ALKPHOS 51 48 47 54 67  BILITOT 2.0* 1.1 0.5 0.8 1.3*   ------------------------------------------------------------------------------------------------------------------ No results for input(s): CHOL, HDL, LDLCALC, TRIG, CHOLHDL, LDLDIRECT in the last 72 hours.  Lab Results  Component Value Date   HGBA1C 5.6 02/26/2020   ------------------------------------------------------------------------------------------------------------------ No results for input(s): TSH, T4TOTAL, T3FREE, THYROIDAB in the last 72 hours.  Invalid input(s): FREET3 ------------------------------------------------------------------------------------------------------------------ Recent Labs    03/01/20 0827 03/02/20 0453  FERRITIN 971* 1,162*    Coagulation profile No results for input(s): INR, PROTIME in the last 168 hours.  Recent Labs    03/01/20 0827 03/02/20 0453  DDIMER 0.46 0.72*    Cardiac Enzymes No results for input(s): CKMB, TROPONINI, MYOGLOBIN in the last 168 hours.  Invalid input(s): CK ------------------------------------------------------------------------------------------------------------------  No results found for: BNP  Micro Results Recent Results (from the past 240 hour(s))  Blood culture (routine x 2)     Status: None   Collection Time: 02/26/20 11:28 PM   Specimen: BLOOD  Result Value Ref Range Status   Specimen Description BLOOD RIGHT ARM  Final   Special Requests   Final    BOTTLES DRAWN AEROBIC AND ANAEROBIC Blood Culture adequate volume   Culture   Final    NO GROWTH 5 DAYS Performed at [facility], [address], [place], [place] [address]    Report Status 03/03/2020 FINAL  Final  Blood culture (routine x 2)     Status: None   Collection Time: 02/26/20 11:39 PM   Specimen: BLOOD  Result Value Ref Range Status   Specimen Description BLOOD LEFT ARM  Final   Special Requests   Final    BOTTLES DRAWN AEROBIC AND ANAEROBIC Blood Culture adequate volume   Culture   Final    NO GROWTH 5 DAYS Performed at [facility], [address], [place], [place] [address]    Report Status 03/03/2020 FINAL  Final    Radiology Reports CT Head Wo Contrast  Result Date: 02/27/2020 CLINICAL DATA:  Encephalopathy, weakness, COVID-19 positive EXAM: CT HEAD WITHOUT CONTRAST TECHNIQUE: Contiguous axial images were obtained from the base of the skull through the vertex without intravenous contrast. COMPARISON:  None. FINDINGS: Brain: Confluent hypodensities throughout the periventricular white matter are most consistent with chronic small vessel ischemic changes. No other signs of acute infarct or hemorrhage. Lateral ventricles and remaining midline structures are unremarkable. No acute extra-axial fluid collections. No mass effect. Vascular: No hyperdense vessel or unexpected calcification. Skull: Normal. Negative for fracture or focal lesion. Sinuses/Orbits: No acute finding. Other: None. IMPRESSION: 1. 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 2. No acute intracranial process. Electronically Signed   By: Sharlet Salina M.D.   On: 02/27/2020 01:10   DG Chest Port 1 View  Result Date: 02/27/2020 CLINICAL DATA:  Possible sepsis EXAM: PORTABLE CHEST 1 VIEW COMPARISON:  None. FINDINGS: Increased opacity in the medial right lung base. Small left pleural effusion. Mild cardiomegaly. IMPRESSION: Increased opacity in the medial right lung base, which may indicate developing consolidation. Small left pleural effusion. Electronically Signed   By: Deatra Robinson M.D.   On: 02/27/2020 00:25

## 2020-03-03 NOTE — TOC Progression Note (Addendum)
Transition of Care [facility]) - Progression Note    Patient Details  Name: Vincent Cummings MRN: [id number] Date of Birth: 1946/04/15  [facility] CM/SW Contact  Mearl Latin, LCSW Phone Number: 03/03/2020, 8:58 AM  Clinical Narrative:    8:58am-CSW left message for patient's son to determine if the family has decided who will sign the patient into SNF.   9:56am-[name] contacted CSW back. CSW presented bed offers. He has selected [facility] and provided email info for admission paperwork ([email or web address]). [facility] to contact him with paperwork.    Expected Discharge Plan: Skilled Nursing Facility Barriers to Discharge: Family Issues, Unsafe home situation  Expected Discharge Plan and Services Expected Discharge Plan: Skilled Nursing Facility In-house Referral: Clinical Social Work   Post Acute Care Choice: Skilled Nursing Facility Living arrangements for the past 2 months: Single Family Home                                       Social Determinants of Health (SDOH) Interventions    Readmission Risk Interventions No flowsheet data found.

## 2020-03-04 DIAGNOSIS — M25521 Pain in right elbow: Secondary | ICD-10-CM | POA: Diagnosis not present

## 2020-03-04 DIAGNOSIS — J9600 Acute respiratory failure, unspecified whether with hypoxia or hypercapnia: Secondary | ICD-10-CM | POA: Diagnosis not present

## 2020-03-04 DIAGNOSIS — M79661 Pain in right lower leg: Secondary | ICD-10-CM | POA: Diagnosis not present

## 2020-03-04 DIAGNOSIS — G9341 Metabolic encephalopathy: Secondary | ICD-10-CM | POA: Diagnosis not present

## 2020-03-04 DIAGNOSIS — Z1159 Encounter for screening for other viral diseases: Secondary | ICD-10-CM | POA: Diagnosis not present

## 2020-03-04 DIAGNOSIS — U071 COVID-19: Secondary | ICD-10-CM | POA: Diagnosis not present

## 2020-03-04 DIAGNOSIS — Z7401 Bed confinement status: Secondary | ICD-10-CM | POA: Diagnosis not present

## 2020-03-04 DIAGNOSIS — F0390 Unspecified dementia without behavioral disturbance: Secondary | ICD-10-CM | POA: Diagnosis not present

## 2020-03-04 DIAGNOSIS — J1282 Pneumonia due to coronavirus disease 2019: Secondary | ICD-10-CM | POA: Diagnosis not present

## 2020-03-04 DIAGNOSIS — R1311 Dysphagia, oral phase: Secondary | ICD-10-CM | POA: Diagnosis not present

## 2020-03-04 DIAGNOSIS — M25571 Pain in right ankle and joints of right foot: Secondary | ICD-10-CM | POA: Diagnosis not present

## 2020-03-04 DIAGNOSIS — M25561 Pain in right knee: Secondary | ICD-10-CM | POA: Diagnosis not present

## 2020-03-04 DIAGNOSIS — E041 Nontoxic single thyroid nodule: Secondary | ICD-10-CM | POA: Diagnosis not present

## 2020-03-04 DIAGNOSIS — M109 Gout, unspecified: Secondary | ICD-10-CM | POA: Diagnosis not present

## 2020-03-04 DIAGNOSIS — K219 Gastro-esophageal reflux disease without esophagitis: Secondary | ICD-10-CM | POA: Diagnosis not present

## 2020-03-04 DIAGNOSIS — R41841 Cognitive communication deficit: Secondary | ICD-10-CM | POA: Diagnosis not present

## 2020-03-04 DIAGNOSIS — M6281 Muscle weakness (generalized): Secondary | ICD-10-CM | POA: Diagnosis not present

## 2020-03-04 DIAGNOSIS — M2550 Pain in unspecified joint: Secondary | ICD-10-CM | POA: Diagnosis not present

## 2020-03-04 DIAGNOSIS — M79671 Pain in right foot: Secondary | ICD-10-CM | POA: Diagnosis not present

## 2020-03-04 DIAGNOSIS — R42 Dizziness and giddiness: Secondary | ICD-10-CM | POA: Diagnosis not present

## 2020-03-04 DIAGNOSIS — R739 Hyperglycemia, unspecified: Secondary | ICD-10-CM | POA: Diagnosis not present

## 2020-03-04 DIAGNOSIS — L89151 Pressure ulcer of sacral region, stage 1: Secondary | ICD-10-CM | POA: Diagnosis not present

## 2020-03-04 DIAGNOSIS — M25421 Effusion, right elbow: Secondary | ICD-10-CM | POA: Diagnosis not present

## 2020-03-04 DIAGNOSIS — M1710 Unilateral primary osteoarthritis, unspecified knee: Secondary | ICD-10-CM | POA: Diagnosis not present

## 2020-03-04 DIAGNOSIS — Z23 Encounter for immunization: Secondary | ICD-10-CM | POA: Diagnosis not present

## 2020-03-04 DIAGNOSIS — F29 Unspecified psychosis not due to a substance or known physiological condition: Secondary | ICD-10-CM | POA: Diagnosis not present

## 2020-03-04 DIAGNOSIS — Z8673 Personal history of transient ischemic attack (TIA), and cerebral infarction without residual deficits: Secondary | ICD-10-CM | POA: Diagnosis not present

## 2020-03-04 DIAGNOSIS — M25551 Pain in right hip: Secondary | ICD-10-CM | POA: Diagnosis not present

## 2020-03-04 DIAGNOSIS — J189 Pneumonia, unspecified organism: Secondary | ICD-10-CM | POA: Diagnosis not present

## 2020-03-04 DIAGNOSIS — G510 Bell's palsy: Secondary | ICD-10-CM | POA: Diagnosis not present

## 2020-03-04 DIAGNOSIS — J9601 Acute respiratory failure with hypoxia: Secondary | ICD-10-CM | POA: Diagnosis not present

## 2020-03-04 DIAGNOSIS — M79651 Pain in right thigh: Secondary | ICD-10-CM | POA: Diagnosis not present

## 2020-03-04 DIAGNOSIS — E785 Hyperlipidemia, unspecified: Secondary | ICD-10-CM | POA: Diagnosis not present

## 2020-03-04 DIAGNOSIS — R4189 Other symptoms and signs involving cognitive functions and awareness: Secondary | ICD-10-CM | POA: Diagnosis not present

## 2020-03-04 DIAGNOSIS — R269 Unspecified abnormalities of gait and mobility: Secondary | ICD-10-CM | POA: Diagnosis not present

## 2020-03-04 DIAGNOSIS — Z1211 Encounter for screening for malignant neoplasm of colon: Secondary | ICD-10-CM | POA: Diagnosis not present

## 2020-03-04 DIAGNOSIS — R0902 Hypoxemia: Secondary | ICD-10-CM | POA: Diagnosis not present

## 2020-03-04 DIAGNOSIS — I69398 Other sequelae of cerebral infarction: Secondary | ICD-10-CM | POA: Diagnosis not present

## 2020-03-04 DIAGNOSIS — I10 Essential (primary) hypertension: Secondary | ICD-10-CM | POA: Diagnosis not present

## 2020-03-04 LAB — BASIC METABOLIC PANEL
Anion gap: 10 (ref 5–15)
BUN: 21 mg/dL (ref 8–23)
CO2: 20 mmol/L — ABNORMAL LOW (ref 22–32)
Calcium: 8.7 mg/dL — ABNORMAL LOW (ref 8.9–10.3)
Chloride: 109 mmol/L (ref 98–111)
Creatinine, Ser: 1.2 mg/dL (ref 0.61–1.24)
GFR calc Af Amer: >60 mL/min (ref >60)
GFR calc non Af Amer: >60 mL/min (ref >60)
Glucose, Bld: 111 mg/dL — ABNORMAL HIGH (ref 70–99)
Potassium: 3.6 mmol/L (ref 3.5–5.1)
Sodium: 139 mmol/L (ref 135–145)

## 2020-03-04 LAB — CBC WITH DIFFERENTIAL/PLATELET
Abs Immature Granulocytes: 0.22 10*3/uL — ABNORMAL HIGH (ref 0.00–0.07)
Basophils Absolute: 0 10*3/uL (ref 0.0–0.1)
Basophils Relative: 0 %
Eosinophils Absolute: 0.2 10*3/uL (ref 0.0–0.5)
Eosinophils Relative: 2 %
HCT: 51.2 % (ref 39.0–52.0)
Hemoglobin: 17.8 g/dL — ABNORMAL HIGH (ref 13.0–17.0)
Immature Granulocytes: 2 %
Lymphocytes Relative: 15 %
Lymphs Abs: 1.8 10*3/uL (ref 0.7–4.0)
MCH: 31.6 pg (ref 26.0–34.0)
MCHC: 34.8 g/dL (ref 30.0–36.0)
MCV: 90.8 fL (ref 80.0–100.0)
Monocytes Absolute: 1.3 10*3/uL — ABNORMAL HIGH (ref 0.1–1.0)
Monocytes Relative: 11 %
Neutro Abs: 8.3 10*3/uL — ABNORMAL HIGH (ref 1.7–7.7)
Neutrophils Relative %: 70 %
Platelets: 353 10*3/uL (ref 150–400)
RBC: 5.64 MIL/uL (ref 4.22–5.81)
RDW: 13.8 % (ref 11.5–15.5)
WBC: 12 10*3/uL — ABNORMAL HIGH (ref 4.0–10.5)
nRBC: 0 % (ref 0.0–0.2)

## 2020-03-04 LAB — GLUCOSE, CAPILLARY
Glucose-Capillary: 106 mg/dL — ABNORMAL HIGH (ref 70–99)
Glucose-Capillary: 125 mg/dL — ABNORMAL HIGH (ref 70–99)

## 2020-03-04 MED ORDER — ASPIRIN EC 81 MG PO TBEC: 81.0000 mg | DELAYED_RELEASE_TABLET | Freq: Every day | ORAL | Status: AC

## 2020-03-04 MED ORDER — ACETAMINOPHEN 325 MG PO TABS: 650.0000 mg | ORAL_TABLET | Freq: Four times a day (QID) | ORAL | Status: AC | PRN

## 2020-03-04 MED ORDER — ONDANSETRON HCL 4 MG PO TABS: 4.0000 mg | ORAL_TABLET | Freq: Four times a day (QID) | ORAL | 0 refills | Status: AC | PRN

## 2020-03-04 MED ORDER — PNEUMOCOCCAL VAC POLYVALENT 25 MCG/0.5ML IJ INJ
0.5000 mL | INJECTION | INTRAMUSCULAR | Status: AC
  Administered 2020-03-04: 0.5 mL via INTRAMUSCULAR
  Filled 2020-03-04 (×2): qty 0.5

## 2020-03-04 MED ORDER — ALBUTEROL SULFATE HFA 108 (90 BASE) MCG/ACT IN AERS: 2.0000 | INHALATION_SPRAY | RESPIRATORY_TRACT | Status: AC | PRN

## 2020-03-04 MED ORDER — POLYETHYLENE GLYCOL 3350 17 G PO PACK: 17.0000 g | PACK | Freq: Every day | ORAL | 0 refills | Status: AC | PRN

## 2020-03-04 MED ORDER — AMLODIPINE BESYLATE 5 MG PO TABS: 5.0000 mg | ORAL_TABLET | Freq: Every day | ORAL | Status: DC

## 2020-03-04 NOTE — Progress Notes (Signed)
Pt leaving via [facility]. Pt in no distress at time of leaving, was able to walk with walker to stretcher.

## 2020-03-04 NOTE — Progress Notes (Signed)
Report called to [facility] at [facility]. [facility] scheduled to pick up pt at 1pm

## 2020-03-04 NOTE — Discharge Summary (Addendum)
PATIENT DETAILS Name: Vincent Cummings Age: 74 y.o. Sex: male Date of Birth: 1946/06/08 MRN: [id number]. Admitting Physician: Marinda Elk, MD QJJ:HERDEYC, Marcos Eke, MD  Admit Date: 02/26/2020 Discharge date: 03/04/2020  Recommendations for Outpatient Follow-up:  1. Follow up with PCP in 1-2 weeks 2. Please obtain CMP/CBC in one week 3. Repeat Chest Xray in 4-6 week 4. Please ensure outpatient follow-up with neurology  Admitted From:  Home  Disposition: SNF   Home Health: No  Equipment/Devices: None  Discharge Condition: Stable  CODE STATUS: FULL CODE  Diet recommendation:  Diet Order            Diet - low sodium heart healthy        Diet Heart Room service appropriate? Yes; Fluid consistency: Thin  Diet effective now               Brief Narrative: Patient is a 74 y.o. male with PMHx of HTN, HLD-who was found confused and wandering outside of a local [place] restaurant-subsequently brought to the ED -further evaluation revealed COVID-19 pneumonia.  See below for further details.  Significant Events: 4/13>> brought to ED by EMS for weakness, confusion-found wandering outside a local [employer] since 4/11.  COVID-19 medications: Steroids:4/13>> 4/18 Remdesivir: 4/13>> 4/18  Antibiotics: Zithromax: 4/13>>4/15 Rocephin: 4/14>>4/15 Vancomycin: 4/13 x 1 Zosyn: 4/13 x 1  Microbiology data: 4/13>> blood culture: No growth  Procedures: None  Consults: None   Brief Hospital Course: Acute Hypoxic Resp Failure due to Covid 19 Viral pneumonia: Initially hypoxic when he first presented to the hospital-no oxygen requirements for the past few days.  Do not think patient had sepsis physiology on admission.    Has completed a course of steroids and remdesivir.  Due to  low suspicion for bacterial pneumonia-Rocephin/Zithromax were discontinued.  He remains on room-CRP has normalized.   COVID-19 Labs:  Recent Labs    03/02/20 0453    DDIMER 0.72*  FERRITIN 1,162*  CRP 0.9    No results found for: SARSCOV2NAA   Acute metabolic encephalopathy superimposed on probable dementia/chronic paranoia: Suspect some component of confusion was from metabolic encephalopathy in the setting of COVID-19 pneumonia, but suspect he probably has some sort of chronic cognitive dysfunction/chronic paranoia at baseline.  I have spoken with the patient's son on multiple occasions over the phone, and also wants with a friend of the patient [name] on 4/16 who did confirm that patient has had memory issues over the past several months (see prior notes).  This morning-patient remains confused and essentially remains the same-at this time he is not deemed to have any capacity to make informed decisions. .  Note-CT head without acute abnormalities.TSH, vitamin B12 within normal limits.  RPR nonreactive.  Further work-up/treatment of dementia deferred to the outpatient setting-please ensure patient has outpatient follow-up with neurology.  AKI: Suspect this is hemodynamically mediated-resolved  Hypokalemia: Repleted  HTN: Blood pressure relatively stable-continue amlodipine.  Follow and adjust.  GERD: Continue PPI  RN pressure injury documentation: Pressure Injury 02/27/20 Coccyx Stage 1 -  Intact skin with non-blanchable redness of a localized area usually over a bony prominence. (Active)  02/27/20 0500  Location: Coccyx  Location Orientation:   Staging: Stage 1 -  Intact skin with non-blanchable redness of a localized area usually over a bony prominence.  Wound Description (Comments):   Present on Admission: Yes    Discharge Diagnoses:  Principal Problem:   Pneumonia due to COVID-19 virus Active Problems:   Essential  hypertension   Acute metabolic encephalopathy   Hyperglycemia   GERD without esophagitis   Discharge Instructions:    Person Under Monitoring Name: ROGUE RAFALSKI  Location: [address] [place] [address]   Infection Prevention Recommendations for Individuals Confirmed to have, or Being Evaluated for, 2019 Novel Coronavirus (COVID-19) Infection Who Receive Care at Home  Individuals who are confirmed to have, or are being evaluated for, COVID-19 should follow the prevention steps below until a healthcare provider or local or state health department says they can return to normal activities.  Stay home except to get medical care You should restrict activities outside your home, except for getting medical care. Do not go to work, school, or public areas, and do not use public transportation or taxis.  Call ahead before visiting your doctor Before your medical appointment, call the healthcare provider and tell them that you have, or are being evaluated for, COVID-19 infection. This will help the healthcare provider's office take steps to keep other people from getting infected. Ask your healthcare provider to call the local or state health department.  Monitor your symptoms Seek prompt medical attention if your illness is worsening (e.g., difficulty breathing). Before going to your medical appointment, call the healthcare provider and tell them that you have, or are being evaluated for, COVID-19 infection. Ask your healthcare provider to call the local or state health department.  Wear a facemask You should wear a facemask that covers your nose and mouth when you are in the same room with other people and when you visit a healthcare provider. People who live with or visit you should also wear a facemask while they are in the same room with you.  Separate yourself from other people in your home As much as possible, you should stay in a different room from other people in your home. Also, you should use a separate bathroom, if available.  Avoid sharing household items You should not share dishes, drinking glasses, cups, eating utensils, towels, bedding, or other  items with other people in your home. After using these items, you should wash them thoroughly with soap and water.  Cover your coughs and sneezes Cover your mouth and nose with a tissue when you cough or sneeze, or you can cough or sneeze into your sleeve. Throw used tissues in a lined trash can, and immediately wash your hands with soap and water for at least 20 seconds or use an alcohol-based hand rub.  Wash your [employer] your hands often and thoroughly with soap and water for at least 20 seconds. You can use an alcohol-based hand sanitizer if soap and water are not available and if your hands are not visibly dirty. Avoid touching your eyes, nose, and mouth with unwashed hands.   Prevention Steps for Caregivers and Household Members of Individuals Confirmed to have, or Being Evaluated for, COVID-19 Infection Being Cared for in the Home  If you live with, or provide care at home for, a person confirmed to have, or being evaluated for, COVID-19 infection please follow these guidelines to prevent infection:  Follow healthcare provider's instructions Make sure that you understand and can help the patient follow any healthcare provider instructions for all care.  Provide for the patient's basic needs You should help the patient with basic needs in the home and provide support for getting groceries, prescriptions, and other personal needs.  Monitor the patient's symptoms If they are getting sicker, call his or her medical provider and  tell them that the patient has, or is being evaluated for, COVID-19 infection. This will help the healthcare provider's office take steps to keep other people from getting infected. Ask the healthcare provider to call the local or state health department.  Limit the number of people who have contact with the patient  If possible, have only one caregiver for the patient.  Other household members should stay in another home or place of residence. If  this is not possible, they should stay  in another room, or be separated from the patient as much as possible. Use a separate bathroom, if available.  Restrict visitors who do not have an essential need to be in the home.  Keep older adults, very young children, and other sick people away from the patient Keep older adults, very young children, and those who have compromised immune systems or chronic health conditions away from the patient. This includes people with chronic heart, lung, or kidney conditions, diabetes, and cancer.  Ensure good ventilation Make sure that shared spaces in the home have good air flow, such as from an air conditioner or an opened window, weather permitting.  Wash your hands often  Wash your hands often and thoroughly with soap and water for at least 20 seconds. You can use an alcohol based hand sanitizer if soap and water are not available and if your hands are not visibly dirty.  Avoid touching your eyes, nose, and mouth with unwashed hands.  Use disposable paper towels to dry your hands. If not available, use dedicated cloth towels and replace them when they become wet.  Wear a facemask and gloves  Wear a disposable facemask at all times in the room and gloves when you touch or have contact with the patient's blood, body fluids, and/or secretions or excretions, such as sweat, saliva, sputum, nasal mucus, vomit, urine, or feces.  Ensure the mask fits over your nose and mouth tightly, and do not touch it during use.  Throw out disposable facemasks and gloves after using them. Do not reuse.  Wash your hands immediately after removing your facemask and gloves.  If your personal clothing becomes contaminated, carefully remove clothing and launder. Wash your hands after handling contaminated clothing.  Place all used disposable facemasks, gloves, and other waste in a lined container before disposing them with other household waste.  Remove gloves and wash  your hands immediately after handling these items.  Do not share dishes, glasses, or other household items with the patient  Avoid sharing household items. You should not share dishes, drinking glasses, cups, eating utensils, towels, bedding, or other items with a patient who is confirmed to have, or being evaluated for, COVID-19 infection.  After the person uses these items, you should wash them thoroughly with soap and water.  Wash laundry thoroughly  Immediately remove and wash clothes or bedding that have blood, body fluids, and/or secretions or excretions, such as sweat, saliva, sputum, nasal mucus, vomit, urine, or feces, on them.  Wear gloves when handling laundry from the patient.  Read and follow directions on labels of laundry or clothing items and detergent. In general, wash and dry with the warmest temperatures recommended on the label.  Clean all areas the individual has used often  Clean all touchable surfaces, such as counters, tabletops, doorknobs, bathroom fixtures, toilets, phones, keyboards, tablets, and bedside tables, every day. Also, clean any surfaces that may have blood, body fluids, and/or secretions or excretions on them.  Wear gloves when cleaning  surfaces the patient has come in contact with.  Use a diluted bleach solution (e.g., dilute bleach with 1 part bleach and 10 parts water) or a household disinfectant with a label that says EPA-registered for coronaviruses. To make a bleach solution at home, add 1 tablespoon of bleach to 1 quart (4 cups) of water. For a larger supply, add  cup of bleach to 1 gallon (16 cups) of water.  Read labels of cleaning products and follow recommendations provided on product labels. Labels contain instructions for safe and effective use of the cleaning product including precautions you should take when applying the product, such as wearing gloves or eye protection and making sure you have good ventilation during use of the  product.  Remove gloves and wash hands immediately after cleaning.  Monitor yourself for signs and symptoms of illness Caregivers and household members are considered close contacts, should monitor their health, and will be asked to limit movement outside of the home to the extent possible. Follow the monitoring steps for close contacts listed on the symptom monitoring form.   ? If you have additional questions, contact your local health department or call the epidemiologist on call at [phone] (available 24/7). ? This guidance is subject to change. For the most up-to-date guidance from CDC, please refer to their website: [employer]Met[employer].hu    Activity:  As tolerated with Full fall precautions use walker/cane & assistance as needed   Discharge Instructions    Ambulatory referral to Neurology   Complete by: As directed    An appointment is requested in approximately: 2 weeks   Call MD for:  difficulty breathing, headache or visual disturbances   Complete by: As directed    Call MD for:  extreme fatigue   Complete by: As directed    Call MD for:  persistant dizziness or light-headedness   Complete by: As directed    Call MD for:  persistant nausea and vomiting   Complete by: As directed    Diet - low sodium heart healthy   Complete by: As directed    Increase activity slowly   Complete by: As directed      Allergies as of 03/04/2020      Reactions   Atorvastatin Itching, Rash      Medication List    STOP taking these medications   amLODipine-benazepril 10-40 MG capsule Commonly known as: LOTREL   aspirin 325 MG tablet Replaced by: aspirin EC 81 MG tablet   colchicine 0.6 MG tablet   famotidine 20 MG tablet Commonly known as: Pepcid   meloxicam 15 MG tablet Commonly known as: MOBIC   metoprolol succinate 25 MG 24 hr tablet Commonly known as: TOPROL-XL   pantoprazole 20 MG tablet Commonly known as:  PROTONIX   spironolactone 25 MG tablet Commonly known as: ALDACTONE     TAKE these medications   acetaminophen 325 MG tablet Commonly known as: TYLENOL Take 2 tablets (650 mg total) by mouth every 6 (six) hours as needed for mild pain or headache (fever >/= 101).   albuterol 108 (90 Base) MCG/ACT inhaler Commonly known as: VENTOLIN HFA Inhale 2 puffs into the lungs every 4 (four) hours as needed for wheezing or shortness of breath.   amLODipine 5 MG tablet Commonly known as: NORVASC Take 1 tablet (5 mg total) by mouth daily. Start taking on: March 05, 2020   aspirin EC 81 MG tablet Take 1 tablet (81 mg total) by mouth daily. Replaces: aspirin 325 MG tablet  ondansetron 4 MG tablet Commonly known as: ZOFRAN Take 1 tablet (4 mg total) by mouth every 6 (six) hours as needed for nausea.   polyethylene glycol 17 g packet Commonly known as: MIRALAX / GLYCOLAX Take 17 g by mouth daily as needed for mild constipation.       Contact information for follow-up providers    Dois Davenportichter, Karen L, MD. Schedule an appointment as soon as possible for a visit in 1 week(s).   Specialty: Family Medicine Contact information: [address] [place] [phone]            Contact information for after-discharge care    Destination    [facility] .   Service: Skilled Nursing Contact information: [address] [place] [id number] [phone]                 Allergies  Allergen Reactions  . Atorvastatin Itching and Rash     Other Procedures/Studies: CT Head Wo Contrast  Result Date: 02/27/2020 CLINICAL DATA:  Encephalopathy, weakness, COVID-19 positive EXAM: CT HEAD WITHOUT CONTRAST TECHNIQUE: Contiguous axial images were obtained from the base of the skull through the vertex without intravenous contrast. COMPARISON:  None. FINDINGS: Brain: Confluent hypodensities throughout the  periventricular white matter are most consistent with chronic small vessel ischemic changes. No other signs of acute infarct or hemorrhage. Lateral ventricles and remaining midline structures are unremarkable. No acute extra-axial fluid collections. No mass effect. Vascular: No hyperdense vessel or unexpected calcification. Skull: Normal. Negative for fracture or focal lesion. Sinuses/Orbits: No acute finding. Other: None. IMPRESSION: 1. 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 2. No acute intracranial process. Electronically Signed   By: Sharlet SalinaMichael  Brown M.D.   On: 02/27/2020 01:10   DG Chest Port 1 View  Result Date: 02/27/2020 CLINICAL DATA:  Possible sepsis EXAM: PORTABLE CHEST 1 VIEW COMPARISON:  None. FINDINGS: Increased opacity in the medial right lung base. Small left pleural effusion. Mild cardiomegaly. IMPRESSION: Increased opacity in the medial right lung base, which may indicate developing consolidation. Small left pleural effusion. Electronically Signed   By: Deatra RobinsonKevin  Herman M.D.   On: 02/27/2020 00:25     TODAY-DAY OF DISCHARGE:  Subjective:   Montague Mangano today has no headache,no chest abdominal pain,no new weakness tingling or numbness, feels much better wants to go home today.   Objective:   Blood pressure 111/85, pulse 81, temperature 98.5 F (36.9 C), temperature source Oral, resp. rate 18, height 5\' 8"  (1.727 m), weight 80.5 kg, SpO2 100 %.  Intake/Output Summary (Last 24 hours) at 03/04/2020 1009 Last data filed at 03/04/2020 0933 Gross per 24 hour  Intake 600 ml  Output --  Net 600 ml   Filed Weights   02/27/20 0506 02/29/20 0413  Weight: 79 kg 80.5 kg    Exam: Awake Alert, Oriented *3, No new F.N deficits, Normal affect [place].AT,PERRAL Supple Neck,No JVD, No cervical lymphadenopathy appriciated.  Symmetrical Chest wall movement, Good air movement bilaterally, CTAB RRR,No Gallops,Rubs or new Murmurs, No Parasternal Heave +ve  B.Sounds, Abd Soft, Non tender, No organomegaly appriciated, No rebound -guarding or rigidity. No Cyanosis, Clubbing or edema, No new Rash or bruise   PERTINENT RADIOLOGIC STUDIES: CT Head Wo Contrast  Result Date: 02/27/2020 CLINICAL DATA:  Encephalopathy, weakness, COVID-19 positive EXAM: CT HEAD WITHOUT CONTRAST TECHNIQUE: Contiguous axial images were obtained from the base of the skull through the vertex without intravenous contrast. COMPARISON:  None. FINDINGS: Brain: Confluent hypodensities throughout the periventricular white matter are most consistent with chronic small vessel ischemic changes. No other signs of acute infarct or hemorrhage. Lateral ventricles and remaining midline structures are unremarkable. No acute extra-axial fluid collections. No mass effect. Vascular: No hyperdense vessel or unexpected calcification. Skull: Normal. Negative for fracture or focal lesion. Sinuses/Orbits: No acute finding. Other: None. IMPRESSION: 1. 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 2. No acute intracranial process. Electronically Signed   By: Sharlet Salina M.D.   On: 02/27/2020 01:10   DG Chest Port 1 View  Result Date: 02/27/2020 CLINICAL DATA:  Possible sepsis EXAM: PORTABLE CHEST 1 VIEW COMPARISON:  None. FINDINGS: Increased opacity in the medial right lung base. Small left pleural effusion. Mild cardiomegaly. IMPRESSION: Increased opacity in the medial right lung base, which may indicate developing consolidation. Small left pleural effusion. Electronically Signed   By: Deatra Robinson M.D.   On: 02/27/2020 00:25     PERTINENT LAB RESULTS: CBC: Recent Labs    03/02/20 0453 03/04/20 0319  WBC 12.5* 12.0*  HGB 19.2* 17.8*  HCT 55.1* 51.2  PLT 330 353   CMET CMP     Component Value Date/Time   NA 139 03/04/2020 0319   K 3.6 03/04/2020 0319   CL 109 03/04/2020 0319   CO2 20 (L) 03/04/2020 0319   GLUCOSE 111 (H) 03/04/2020 0319   BUN 21  03/04/2020 0319   CREATININE 1.20 03/04/2020 0319   CALCIUM 8.7 (L) 03/04/2020 0319   PROT 7.3 03/02/2020 0453   ALBUMIN 3.2 (L) 03/02/2020 0453   AST 39 03/02/2020 0453   ALT 38 03/02/2020 0453   ALKPHOS 67 03/02/2020 0453   BILITOT 1.3 (H) 03/02/2020 0453   GFRNONAA >60 03/04/2020 0319   GFRAA >60 03/04/2020 0319    GFR Estimated Creatinine Clearance: 53.8 mL/min (by C-G formula based on SCr of 1.2 mg/dL). No results for input(s): LIPASE, AMYLASE in the last 72 hours. No results for input(s): CKTOTAL, CKMB, CKMBINDEX, TROPONINI in the last 72 hours. Invalid input(s): POCBNP Recent Labs    03/02/20 0453  DDIMER 0.72*   No results for input(s): HGBA1C in the last 72 hours. No results for input(s): CHOL, HDL, LDLCALC, TRIG, CHOLHDL, LDLDIRECT in the last 72 hours. No results for input(s): TSH, T4TOTAL, T3FREE, THYROIDAB in the last 72 hours.  Invalid input(s): FREET3 Recent Labs    03/02/20 0453  FERRITIN 1,162*   Coags: No results for input(s): INR in the last 72 hours.  Invalid input(s): PT Microbiology: Recent Results (from the past 240 hour(s))  Blood culture (routine x 2)     Status: None   Collection Time: 02/26/20 11:28 PM   Specimen: BLOOD  Result Value Ref Range Status   Specimen Description BLOOD RIGHT ARM  Final   Special Requests   Final    BOTTLES DRAWN AEROBIC AND ANAEROBIC Blood Culture adequate volume   Culture   Final    NO GROWTH 5 DAYS Performed at [facility], [address] [place], [place] [address]    Report Status 03/03/2020 FINAL  Final  Blood culture (routine x 2)     Status: None   Collection Time: 02/26/20 11:39 PM   Specimen: BLOOD  Result Value Ref Range Status   Specimen Description BLOOD LEFT ARM  Final   Special Requests   Final    BOTTLES DRAWN AEROBIC AND ANAEROBIC Blood Culture adequate volume   Culture   Final  NO GROWTH 5 DAYS Performed at [facility], [place] [address], [place], [place] [address]     Report Status 03/03/2020 FINAL  Final    FURTHER DISCHARGE INSTRUCTIONS:  Get Medicines reviewed and adjusted: Please take all your medications with you for your next visit with your Primary MD  Laboratory/radiological data: Please request your Primary MD to go over all hospital tests and procedure/radiological results at the follow up, please ask your Primary MD to get all Hospital records sent to his/her office.  In some cases, they will be blood work, cultures and biopsy results pending at the time of your discharge. Please request that your primary care M.D. goes through all the records of your hospital data and follows up on these results.  Also Note the following: If you experience worsening of your admission symptoms, develop shortness of breath, life threatening emergency, suicidal or homicidal thoughts you must seek medical attention immediately by calling 911 or calling your MD immediately  if symptoms less severe.  You must read complete instructions/literature along with all the possible adverse reactions/side effects for all the Medicines you take and that have been prescribed to you. Take any new Medicines after you have completely understood and accpet all the possible adverse reactions/side effects.   Do not drive when taking Pain medications or sleeping medications (Benzodaizepines)  Do not take more than prescribed Pain, Sleep and Anxiety Medications. It is not advisable to combine anxiety,sleep and pain medications without talking with your primary care practitioner  Special Instructions: If you have smoked or chewed Tobacco  in the last 2 yrs please stop smoking, stop any regular Alcohol  and or any Recreational drug use.  Wear Seat belts while driving.  Please note: You were cared for by a hospitalist during your hospital stay. Once you are discharged, your primary care physician will handle any further medical issues. Please note that NO REFILLS for any discharge  medications will be authorized once you are discharged, as it is imperative that you return to your primary care physician (or establish a relationship with a primary care physician if you do not have one) for your post hospital discharge needs so that they can reassess your need for medications and monitor your lab values.  Total Time spent coordinating discharge including counseling, education and face to face time equals 35 minutes.  SignedOren Binet 03/04/2020 10:09 AM

## 2020-03-04 NOTE — Care Management Important Message (Signed)
Important Message  Patient Details  Name: Vincent Cummings MRN: [id number] Date of Birth: 12-23-1945   Medicare Important Message Given:  Yes - Important Message mailed due to current National Emergency  Verbal consent obtained due to current National Emergency  Relationship to patient: Self Contact Name: Bria Portales Call Date: 03/04/20  Time: 1128 Phone: [phone] Outcome: No Answer/Busy Important Message mailed to: Patient address on file    Orson Aloe 03/04/2020, 11:29 AM

## 2020-03-04 NOTE — TOC Transition Note (Signed)
Transition of Care [facility]) - CM/SW Discharge Note   Patient Details  Name: JOBAN COLLEDGE MRN: [id number] Date of Birth: 10-11-1946  Transition of Care [facility]) CM/SW Contact:  Mearl Latin, LCSW Phone Number: 03/04/2020, 10:36 AM   Clinical Narrative:    Patient will DC to: [facility]  Anticipated DC date: 03/04/20 Family notified: Son, [name] Transport by: Hermina Barters   Per MD patient ready for DC to [facility]. RN, patient, patient's family, and facility notified of DC. Discharge Summary and FL2 sent to facility. RN to call report prior to discharge [phone]). DC packet on chart. Ambulance transport requested for patient.   CSW will sign off for now as social work intervention is no longer needed. Please consult [place] again if new needs arise.      Final next level of care: Skilled Nursing Facility Barriers to Discharge: Barriers Resolved   Patient Goals and CMS Choice Patient states their goals for this hospitalization and ongoing recovery are:: Rehab CMS Medicare.gov Compare Post Acute Care list provided to:: Patient Represenative (must comment)(Son and cousin, Janyth Contes) Choice offered to / list presented to : Adult Children  Discharge Placement   Existing PASRR number confirmed : 03/04/20          Patient chooses bed at: [facility] and Rehab Patient to be transferred to facility by: [facility] Name of family member notified: Son, [name] Patient and family notified of of transfer: 03/04/20  Discharge Plan and Services In-house Referral: Clinical Social Work   Post Acute Care Choice: Skilled Nursing Facility                               Social Determinants of Health (SDOH) Interventions     Readmission Risk Interventions No flowsheet data found.

## 2020-03-05 ENCOUNTER — Non-Acute Institutional Stay (SKILLED_NURSING_FACILITY): Payer: Medicare Other | Admitting: Adult Health

## 2020-03-05 ENCOUNTER — Encounter: Payer: Self-pay | Admitting: Adult Health

## 2020-03-05 DIAGNOSIS — U071 COVID-19: Secondary | ICD-10-CM | POA: Diagnosis not present

## 2020-03-05 DIAGNOSIS — I10 Essential (primary) hypertension: Secondary | ICD-10-CM | POA: Diagnosis not present

## 2020-03-05 DIAGNOSIS — J1282 Pneumonia due to coronavirus disease 2019: Secondary | ICD-10-CM

## 2020-03-05 DIAGNOSIS — R4189 Other symptoms and signs involving cognitive functions and awareness: Secondary | ICD-10-CM | POA: Diagnosis not present

## 2020-03-05 NOTE — Progress Notes (Signed)
Location:  [facility] Room Number: 306-A Place of Service:  SNF (31) Provider:  Kenard Gower, DNP, FNP-BC  Patient Care Team: [name], MD as PCP - General (Family Medicine)  Extended Emergency Contact Information Primary Emergency Contact: Pascal Lux of [place] Mobile Phone: [phone] Relation: Son Secondary Emergency Contact: Gillermina Hu Home Phone: [phone] Relation: Relative  Code Status:  Full Code  Goals of care: Advanced Directive information Advanced Directives 02/26/2020  Does Patient Have a Medical Advance Directive? No  Would patient like information on creating a medical advance directive? No - Patient declined     Chief Complaint  Patient presents with  . Acute Visit    Hospital followup, status post admission at [facility] 4/13-4/20/21 secondary to pneumonia due to COVID-19    HPI:  Pt is a 74 y.o. male who was admitted to [facility] on 03/04/20 post hospitalization 02/26/20 to 03/04/20. He has a PMH of hypertension and hyperlipidemia.  He was found confused and wandering outside of a local [employer] since 4/11.  He was brought to the ED on 4/13 for further evaluation.  CT head showed no acute abnormalities. He tested positive for COVID-19 antigen.  Chest x-ray revealed bibasilar patchy infiltrates concerning for atypical COVID-19 pneumonia.  He may was noted to have an oxygen saturation of 90% on arrival and therefore was placed on O2 2 L oxygen labs soft nasal cannula.  He was given steroids and remdesivir on 4/13 to 4/18.  He was given Zithromax on 4/13 to 4/15, Rocephin on 4/14 to 4/15, Vancomycin X 1  on 4/13 and Zosyn X 1 on 4/13. Blood culture done on 4/13 showed no growth.  He was seen in his room today. He has no eye contact when he talks. Tried talking to him in [place] language but he answers in [place]. He said that he came from [place] and came to  [place] in 1969. He worked as an Barrister's clerk. When asked what is the month and year, he said it is March but cannot tell the year and said that it doesn't matter. When asked more questions, he kept replying that it doesn't matter.    Past Medical History:  Diagnosis Date  . Essential hypertension   . GERD without esophagitis   . Gout    History reviewed. No pertinent surgical history.  Allergies  Allergen Reactions  . Atorvastatin Itching and Rash    Outpatient Encounter Medications as of 03/05/2020  Medication Sig  . acetaminophen (TYLENOL) 325 MG tablet Take 2 tablets (650 mg total) by mouth every 6 (six) hours as needed for mild pain or headache (fever >/= 101).  Marland Kitchen albuterol (VENTOLIN HFA) 108 (90 Base) MCG/ACT inhaler Inhale 2 puffs into the lungs every 4 (four) hours as needed for wheezing or shortness of breath.  Marland Kitchen amLODipine (NORVASC) 5 MG tablet Take 1 tablet (5 mg total) by mouth daily.  Marland Kitchen aspirin EC 81 MG tablet Take 1 tablet (81 mg total) by mouth daily.  . bisacodyl (DULCOLAX) 10 MG suppository Place 10 mg rectally daily as needed for moderate constipation.  . magnesium hydroxide (MILK OF MAGNESIA) 400 MG/5ML suspension Take 30 mLs by mouth daily as needed for mild constipation.  . ondansetron (ZOFRAN) 4 MG tablet Take 1 tablet (4 mg total) by mouth every 6 (six) hours as needed for nausea.  . polyethylene glycol (MIRALAX / GLYCOLAX) 17 g packet Take 17 g by mouth daily  as needed for mild constipation.  . SODIUM PHOSPHATES RE Place 1 each rectally daily as needed.   No facility-administered encounter medications on file as of 03/05/2020.    Review of Systems  Unable to obtain due to cognitive deficits. He does not engage in queries.    Immunization History  Administered Date(s) Administered  . Influenza, High Dose Seasonal PF 08/14/2014  . Pneumococcal Polysaccharide-23 03/04/2020   Pertinent  Health Maintenance Due  Topic Date Due  . COLONOSCOPY  Never done   . INFLUENZA VACCINE  06/15/2020  . PNA vac Low Risk Adult (2 of 2 - PCV13) 03/04/2021    Vitals:   03/05/20 0847  BP: (!) 146/93  Pulse: 84  Resp: (!) 21  Temp: 97.7 F (36.5 C)  TempSrc: Oral  SpO2: 97%  Weight: 172 lb 3.2 oz (78.1 kg)  Height: 5\' 8"  (1.727 m)   Body mass index is 26.18 kg/m.  Physical Exam  GENERAL APPEARANCE: Well nourished. In no acute distress. Normal body habitus SKIN:  Skin is warm and dry.  MOUTH and THROAT: Lips are without lesions. Oral mucosa is moist and without lesions. Tongue is normal in shape, size, and color and without lesions RESPIRATORY: Breathing is even & unlabored, BS CTAB CARDIAC: RRR, no murmur,no extra heart sounds, no edema GI: Abdomen soft, normal BS, no masses, no tenderness NEUROLOGICAL: There is no tremor. Speech is clear. Alert to self, disoriented to time and place. PSYCHIATRIC:  Affect and behavior are appropriate  Labs reviewed: Recent Labs    03/01/20 0827 03/02/20 0453 03/04/20 0319  NA 139 140 139  K 3.1* 3.9 3.6  CL 106 108 109  CO2 21* 20* 20*  GLUCOSE 102* 109* 111*  BUN 17 17 21   CREATININE 1.19 1.19 1.20  CALCIUM 8.3* 9.0 8.7*   Recent Labs    02/29/20 0303 03/01/20 0827 03/02/20 0453  AST 25 33 39  ALT 22 28 38  ALKPHOS 47 54 67  BILITOT 0.5 0.8 1.3*  PROT 5.3* 5.7* 7.3  ALBUMIN 2.4* 2.7* 3.2*   Recent Labs    03/01/20 0827 03/02/20 0453 03/04/20 0319  WBC 8.0 12.5* 12.0*  NEUTROABS 5.3 9.1* 8.3*  HGB 16.5 19.2* 17.8*  HCT 47.5 55.1* 51.2  MCV 92.1 91.7 90.8  PLT 244 330 353   Lab Results  Component Value Date   TSH 0.631 02/29/2020   Lab Results  Component Value Date   HGBA1C 5.6 02/26/2020   Significant Diagnostic Results in last 30 days:  CT Head Wo Contrast  Result Date: 02/27/2020 CLINICAL DATA:  Encephalopathy, weakness, COVID-19 positive EXAM: CT HEAD WITHOUT CONTRAST TECHNIQUE: Contiguous axial images were obtained from the base of the skull through the vertex without  intravenous contrast. COMPARISON:  None. FINDINGS: Brain: Confluent hypodensities throughout the periventricular white matter are most consistent with chronic small vessel ischemic changes. No other signs of acute infarct or hemorrhage. Lateral ventricles and remaining midline structures are unremarkable. No acute extra-axial fluid collections. No mass effect. Vascular: No hyperdense vessel or unexpected calcification. Skull: Normal. Negative for fracture or focal lesion. Sinuses/Orbits: No acute finding. Other: None. IMPRESSION: 1. 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 2. No acute intracranial process. Electronically Signed   By: Randa Ngo M.D.   On: 02/27/2020 01:10   DG Chest Port 1 View  Result Date: 02/27/2020 CLINICAL DATA:  Possible sepsis EXAM: PORTABLE CHEST 1 VIEW COMPARISON:  None. FINDINGS: Increased opacity in the medial right lung  base. Small left pleural effusion. Mild cardiomegaly. IMPRESSION: Increased opacity in the medial right lung base, which may indicate developing consolidation. Small left pleural effusion. Electronically Signed   By: Deatra Robinson M.D.   On: 02/27/2020 00:25    Assessment/Plan  1. Pneumonia due to COVID-19 virus - Has completed a course of steroid and remdesivir.  Due to low suspicion for bacterial pneumonia, Rocephin/Zithromax were discontinued -Chest x-ray 4 weeks  2. Essential hypertension -Continue Norvasc 5 mg 1 tab daily  3. 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  Labs/tests ordered: CBC and CMP in 1 week, chest x-ray in 4  weeks  Goals of care:   Short-term care   Kenard Gower, DNP, FNP-BC [facility] and Adult Medicine [phone] (Monday-Friday 8:00 a.m. - 5:00 p.m.) [phone] (after hours)

## 2020-03-06 ENCOUNTER — Non-Acute Institutional Stay (SKILLED_NURSING_FACILITY): Payer: Medicare Other | Admitting: Internal Medicine

## 2020-03-06 ENCOUNTER — Encounter: Payer: Self-pay | Admitting: Internal Medicine

## 2020-03-06 DIAGNOSIS — L89151 Pressure ulcer of sacral region, stage 1: Secondary | ICD-10-CM | POA: Insufficient documentation

## 2020-03-06 DIAGNOSIS — J1282 Pneumonia due to coronavirus disease 2019: Secondary | ICD-10-CM

## 2020-03-06 DIAGNOSIS — G9341 Metabolic encephalopathy: Secondary | ICD-10-CM | POA: Diagnosis not present

## 2020-03-06 DIAGNOSIS — U071 COVID-19: Secondary | ICD-10-CM

## 2020-03-06 NOTE — Patient Instructions (Signed)
See assessment and plan under each diagnosis in the problem list and acutely for this visit 

## 2020-03-06 NOTE — Progress Notes (Signed)
NURSING HOME LOCATION:  [facility] ROOM NUMBER:  306-A  CODE STATUS:  Full Code  PCP:  [name], MD  [address] [place] [address]  This is a comprehensive admission note to [facility] performed on this date less than 30 days from date of admission. Included are preadmission medical/surgical history; reconciled medication list; family history; social history and comprehensive review of systems.  Corrections and additions to the records were documented. Comprehensive physical exam was also performed. Additionally a clinical summary was entered for each active diagnosis pertinent to this admission in the Problem List to enhance continuity of care.  HPI: Patient was hospitalized 4/13-4/20/2021 found confused and wandering outside a local [employer] he apparently frequents.  In the ED he was hypoxic and SARS coronavirus 2 Ag testing was positive with imaging suggesting medial RLL pneumonia. D-dimer was 0.72 (< 0.5) and ferritin 1162.  CRP was 4.2 (< 1.0).  For the Covid associated pneumonia he received steroids and remdesivir 4/13-4/18 .  He received  vancomycin and Zosyn on 4/13 and Rocephin 4/14-4/15.  He had 2 days of azithromycin 4/13-4/15. He was felt to have encephalopathy possibly related to C-19 pneumonia superimposed on chronic cognitive dysfunction and possibly chronic paranoia.  A friend did confirm the patient has had memory issues over the past several months.  CT revealed no acute abnormalities. He was not deemed to have capacity to make informed decisions.  Associated AKI did resolve and hypokalemia was repleted. He was noted to have a stage I coccygeal injury with nonblanchable erythema over bony prominence.  Past medical and surgical history: Includes gout, GERD, and essential hypertension. No surgical history on file.  Social history: Nondrinker, never smoked.  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  He also stated he still drives.  Family history: No family medical history on file.  Apparently one son is in the [place] [employer] stationed at [facility], [place]   Review of systems:  Could not be completed due to probable dementia.  When I asked why he had been in the hospital he answered "got sick".  He could not or would not elaborate.  When I asked on what date he became ill, he told me "10/16/1946" twice.  He also gave this as his date of birth.  Except for the date of birth; these were basically the same answers he gave the Psychiatric NP during her inpatient consultation.  His answers to all review of systems queries was "no".  Speech therapy attempted mental status testing yesterday but his only reply to all queries was "it does not matter".  Constitutional: No fever, significant weight change, fatigue  Eyes: No redness, discharge, pain, vision change ENT/mouth: No nasal congestion, purulent discharge, earache, change in hearing, sore throat  Cardiovascular: No chest pain, palpitations, paroxysmal nocturnal dyspnea, claudication, edema  Respiratory: No cough, sputum production, hemoptysis, DOE, significant snoring, apnea Gastrointestinal: No heartburn, dysphagia, abdominal pain, nausea /vomiting, rectal bleeding, melena, change in bowels Genitourinary: No dysuria, hematuria, pyuria, incontinence, nocturia Musculoskeletal: No joint stiffness, joint swelling, weakness, pain Dermatologic: No rash, pruritus, change in appearance of skin Neurologic: No dizziness, headache, syncope, seizures, numbness, tingling Psychiatric: No significant anxiety, depression, insomnia, anorexia Endocrine: No change in hair/skin/nails, excessive thirst, excessive hunger, excessive urination  Hematologic/lymphatic: No significant bruising, lymphadenopathy, abnormal bleeding Allergy/immunology: No itchy/watery eyes, significant sneezing, urticaria, angioedema  Physical exam:  Pertinent or positive findings: Hair is disheveled and white.  Slight ptosis on the right is suggested.  He tends to keep his eyes closed during the interview.  The lower lids are puffy.  Dental hygiene is fair.  He exhibited minor rales in the right lower lobe anteriorly.  Dorsalis pedis pulses are stronger than the posterior tibial pulses.  Strength opposition is fair-good in all extremities.  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  Irregular vitiligo is moe pronounced at the left medial ankle.  There is subcutaneous fullness of the left elbow suggesting prior bursitis.  He denies any tenderness and denies existence of any asymmetry of the elbows.  General appearance: Adequately nourished; no acute distress, increased work of breathing is present.   Lymphatic: No lymphadenopathy about the head, neck, axilla. Eyes: No conjunctival inflammation or lid edema is present. There is no scleral icterus. Ears:  External ear exam shows no significant lesions or deformities.   Nose:  External nasal examination shows no deformity or inflammation. Nasal mucosa are pink and moist without lesions, exudates Oral exam:There is no oropharyngeal erythema or exudate. Neck:  No thyromegaly, masses, tenderness noted.    Heart:  Normal rate and regular rhythm. S1 and S2 normal without gallop, murmur, click, rub.  Lungs:  without wheezes, rhonchi, rubs. Abdomen: Bowel sounds are normal.  Abdomen is soft and nontender with no organomegaly, hernias, masses. GU: Deferred  Extremities:  No cyanosis, clubbing, edema. Neurologic exam:Balance, Rhomberg, finger to nose testing could not be completed due to clinical state Skin: Warm & dry w/o tenting. No significant rash beyond reported vitiligo.  See clinical summary under each active problem in the Problem List with associated updated therapeutic plan

## 2020-03-06 NOTE — Assessment & Plan Note (Addendum)
COVID "fog " may be component but significant pre-existing neuropsychiatric issues suspected PCP can arrange Neuro/Psychiatric follow-up after discharge from the SNF.  HCPOA needed

## 2020-03-06 NOTE — Assessment & Plan Note (Signed)
Wound care nurse to monitor lesion at SNF.

## 2020-03-07 NOTE — Assessment & Plan Note (Signed)
Denies active C-19 symptoms Minor rales on exam RLL anteriorly; O2 sats 97% on RA

## 2020-03-11 ENCOUNTER — Encounter: Payer: Self-pay | Admitting: Internal Medicine

## 2020-03-11 ENCOUNTER — Non-Acute Institutional Stay (SKILLED_NURSING_FACILITY): Payer: Medicare Other | Admitting: Adult Health

## 2020-03-11 ENCOUNTER — Encounter: Payer: Self-pay | Admitting: Adult Health

## 2020-03-11 DIAGNOSIS — M25521 Pain in right elbow: Secondary | ICD-10-CM | POA: Diagnosis not present

## 2020-03-11 DIAGNOSIS — R29818 Other symptoms and signs involving the nervous system: Secondary | ICD-10-CM | POA: Insufficient documentation

## 2020-03-11 DIAGNOSIS — R4189 Other symptoms and signs involving cognitive functions and awareness: Secondary | ICD-10-CM

## 2020-03-11 NOTE — Progress Notes (Signed)
Location:  [facility] Room Number: 306-A Place of Service:  SNF (31) Provider:  Durenda Age, DNP, FNP-BC  Patient Care Team: [name], MD as PCP - General (Family Medicine)  Extended Emergency Contact Information Primary Emergency Contact: Sharion Dove of [place] Mobile Phone: [phone] Relation: Son Secondary Emergency Contact: Haywood Filler Home Phone: [phone] Relation: Relative  Code Status:  Full Code  Goals of care: Advanced Directive information Advanced Directives 02/26/2020  Does Patient Have a Medical Advance Directive? No  Would patient like information on creating a medical advance directive? No - Patient declined     Chief Complaint  Patient presents with  . Acute Visit    Patient is seen for right elbow pain.     HPI:  Pt is a 74 y.o. male seen today for right elbow pain.  He is a short-term care resident of [facility].  He has a PMH of hypertension and hyperlipidemia.   He was admitted to [facility] post hospitalization  02/26/20 to 03/04/20 for pneumonia due to COVID-19 virus.  He completed a course of steroid and remdesivir.  He was found confused and wandering outside of a local [employer].  CT head showed no acute abnormalities.  Speech therapies administered SLUMS and obtained 0/30.   He was noted to guard his right elbow during therapy.  No erythema no swelling was noted.  X-ray of right elbow showed no acute abnormalities.  He refused to answer queries and says that " it does not matter anyway".  No reported fever no shortness of breath.   Past Medical History:  Diagnosis Date  . Essential hypertension   . GERD without esophagitis   . Gout    History reviewed. No pertinent surgical history.  Allergies  Allergen Reactions  . Atorvastatin Itching and Rash    Outpatient Encounter Medications as of 03/11/2020  Medication Sig    . acetaminophen (TYLENOL) 325 MG tablet Take 2 tablets (650 mg total) by mouth every 6 (six) hours as needed for mild pain or headache (fever >/= 101).  Marland Kitchen albuterol (VENTOLIN HFA) 108 (90 Base) MCG/ACT inhaler Inhale 2 puffs into the lungs every 4 (four) hours as needed for wheezing or shortness of breath.  Marland Kitchen amLODipine (NORVASC) 5 MG tablet Take 1 tablet (5 mg total) by mouth daily.  Marland Kitchen aspirin EC 81 MG tablet Take 1 tablet (81 mg total) by mouth daily.  . bisacodyl (DULCOLAX) 10 MG suppository Place 10 mg rectally daily as needed for moderate constipation.  . magnesium hydroxide (MILK OF MAGNESIA) 400 MG/5ML suspension Take 30 mLs by mouth daily as needed for mild constipation.  . Nutritional Supplement LIQD Take 120 mLs by mouth 2 (two) times daily. MedPass  . ondansetron (ZOFRAN) 4 MG tablet Take 1 tablet (4 mg total) by mouth every 6 (six) hours as needed for nausea.  . polyethylene glycol (MIRALAX / GLYCOLAX) 17 g packet Take 17 g by mouth daily as needed for mild constipation.  . SODIUM PHOSPHATES RE Place 1 each rectally daily as needed.   No facility-administered encounter medications on file as of 03/11/2020.    Review of Systems unable to obtain due to refusal to answer queries    Immunization History  Administered Date(s) Administered  . Influenza, High Dose Seasonal PF 08/14/2014  . Pneumococcal Polysaccharide-23 03/04/2020   Pertinent  Health Maintenance Due  Topic Date Due  . COLONOSCOPY  Never done  .  INFLUENZA VACCINE  06/15/2020  . PNA vac Low Risk Adult (2 of 2 - PCV13) 03/04/2021    Vitals:   03/11/20 1030  BP: (!) 147/90  Pulse: 96  Resp: 20  Temp: 97.9 F (36.6 C)  TempSrc: Oral  SpO2: 97%  Weight: 172 lb 3.2 oz (78.1 kg)  Height: 5\' 8"  (1.727 m)   Body mass index is 26.18 kg/m.  Physical Exam  GENERAL APPEARANCE: Well nourished. In no acute distress. Normal body habitus SKIN:  Skin is warm and dry.  MOUTH and THROAT: Lips are without lesions.  Oral mucosa is moist and without lesions.  RESPIRATORY: Breathing is even & unlabored, BS CTAB CARDIAC: RRR, no murmur,no extra heart sounds, no edema GI: Abdomen soft, normal BS, no masses, no tenderness EXTREMITIES:  Able to move X 4 extremities NEUROLOGICAL: There is no tremor. Speech is clear. PSYCHIATRIC: Eyes close and opens occasionally; refuses to answer queries  Labs reviewed: Recent Labs    03/01/20 0827 03/02/20 0453 03/04/20 0319  NA 139 140 139  K 3.1* 3.9 3.6  CL 106 108 109  CO2 21* 20* 20*  GLUCOSE 102* 109* 111*  BUN 17 17 21   CREATININE 1.19 1.19 1.20  CALCIUM 8.3* 9.0 8.7*   Recent Labs    02/29/20 0303 03/01/20 0827 03/02/20 0453  AST 25 33 39  ALT 22 28 38  ALKPHOS 47 54 67  BILITOT 0.5 0.8 1.3*  PROT 5.3* 5.7* 7.3  ALBUMIN 2.4* 2.7* 3.2*   Recent Labs    03/01/20 0827 03/02/20 0453 03/04/20 0319  WBC 8.0 12.5* 12.0*  NEUTROABS 5.3 9.1* 8.3*  HGB 16.5 19.2* 17.8*  HCT 47.5 55.1* 51.2  MCV 92.1 91.7 90.8  PLT 244 330 353   Lab Results  Component Value Date   TSH 0.631 02/29/2020   Lab Results  Component Value Date   HGBA1C 5.6 02/26/2020    Significant Diagnostic Results in last 30 days:  CT Head Wo Contrast  Result Date: 02/27/2020 CLINICAL DATA:  Encephalopathy, weakness, COVID-19 positive EXAM: CT HEAD WITHOUT CONTRAST TECHNIQUE: Contiguous axial images were obtained from the base of the skull through the vertex without intravenous contrast. COMPARISON:  None. FINDINGS: Brain: Confluent hypodensities throughout the periventricular white matter are most consistent with chronic small vessel ischemic changes. No other signs of acute infarct or hemorrhage. Lateral ventricles and remaining midline structures are unremarkable. No acute extra-axial fluid collections. No mass effect. Vascular: No hyperdense vessel or unexpected calcification. Skull: Normal. Negative for fracture or focal lesion. Sinuses/Orbits: No acute finding. Other: None.  IMPRESSION: 1. 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 2. No acute intracranial process. Electronically Signed   By: 02/28/2020 M.D.   On: 02/27/2020 01:10   DG Chest Port 1 View  Result Date: 02/27/2020 CLINICAL DATA:  Possible sepsis EXAM: PORTABLE CHEST 1 VIEW COMPARISON:  None. FINDINGS: Increased opacity in the medial right lung base. Small left pleural effusion. Mild cardiomegaly. IMPRESSION: Increased opacity in the medial right lung base, which may indicate developing consolidation. Small left pleural effusion. Electronically Signed   By: 02/29/2020 M.D.   On: 02/27/2020 00:25    Assessment/Plan  1. Right elbow pain - x-ray was negative for fracture, will start Diclofenac 1% gel apply 2 gm topically to right elbow BID  2. Cognitive deficits - scored 0/30 on SLUMS test - ST working on problem solving and safety awareness - continue supportive care  Family/ staff Communication: Discussed plan  of care with resident and charge nurse.  Labs/tests ordered: X-ray of right elbow  Goals of care:   Short-term care   Kenard Gower, DNP, FNP-BC [facility] and Adult Medicine [phone] (Monday-Friday 8:00 a.m. - 5:00 p.m.) [phone] (after hours)

## 2020-03-12 LAB — HEPATIC FUNCTION PANEL
ALT: 64 — AB (ref 10–40)
AST: 35 (ref 14–40)
Alkaline Phosphatase: 100 (ref 25–125)
Bilirubin, Total: 0.9

## 2020-03-12 LAB — COMPREHENSIVE METABOLIC PANEL
Albumin: 3.3 — AB (ref 3.5–5.0)
Calcium: 9.1 (ref 8.7–10.7)
GFR calc Af Amer: 86.38
GFR calc non Af Amer: 74.53
Globulin: 3.2

## 2020-03-12 LAB — BASIC METABOLIC PANEL
BUN: 16 (ref 4–21)
CO2: 21 (ref 13–22)
Chloride: 108 (ref 99–108)
Creatinine: 1 (ref 0.6–1.3)
Glucose: 98
Potassium: 4.3 (ref 3.4–5.3)
Sodium: 145 (ref 137–147)

## 2020-03-12 LAB — CBC AND DIFFERENTIAL
HCT: 51 (ref 41–53)
Hemoglobin: 16.9 (ref 13.5–17.5)
Neutrophils Absolute: 9
Platelets: 319 (ref 150–399)
WBC: 12.6

## 2020-03-12 LAB — CBC: RBC: 5.33 — AB (ref 3.87–5.11)

## 2020-03-17 LAB — CBC AND DIFFERENTIAL
HCT: 46 (ref 41–53)
Hemoglobin: 15.5 (ref 13.5–17.5)
Neutrophils Absolute: 8
Platelets: 275 (ref 150–399)
WBC: 11.3

## 2020-03-17 LAB — CBC: RBC: 4.84 (ref 3.87–5.11)

## 2020-03-24 ENCOUNTER — Encounter: Payer: Self-pay | Admitting: Adult Health

## 2020-03-24 ENCOUNTER — Non-Acute Institutional Stay (SKILLED_NURSING_FACILITY): Payer: Medicare Other | Admitting: Adult Health

## 2020-03-24 DIAGNOSIS — F29 Unspecified psychosis not due to a substance or known physiological condition: Secondary | ICD-10-CM

## 2020-03-24 DIAGNOSIS — I10 Essential (primary) hypertension: Secondary | ICD-10-CM

## 2020-03-24 NOTE — Progress Notes (Signed)
Location:  [facility] Room Number: 306 A Place of Service:  SNF (31) Provider:  Kenard Gower, DNP, FNP-BC  Patient Care Team: [name], MD as PCP - General (Family Medicine)  Extended Emergency Contact Information Primary Emergency Contact: Pascal Lux of [place] Mobile Phone: [phone] Relation: Son Secondary Emergency Contact: Gillermina Hu Home Phone: [phone] Relation: Relative  Code Status:  Full Code  Goals of care: Advanced Directive information Advanced Directives 02/26/2020  Does Patient Have a Medical Advance Directive? No  Would patient like information on creating a medical advance directive? No - Patient declined     Chief Complaint  Patient presents with  . Acute Visit    Elevated BPs and agitation    HPI:  Pt is a 74 y.o. male seen today for medical management of chronic diseases. Review of BPs - SBPs ranging from 126 to 186. He denies having headache. On 03/13/20, he was started on Seroquel 12.5 mg BID for psychosis. He was reported to get agitated during ADL care and was striking the staff. Today, he was seen sitting on a geri-chair. He was verbally responsive. No was verbally responsive and opens his eyes when asked to. No recent reported agitation.    Past Medical History:  Diagnosis Date  . Essential hypertension   . GERD without esophagitis   . Gout    No past surgical history on file.  Allergies  Allergen Reactions  . Atorvastatin Itching and Rash    Outpatient Encounter Medications as of 03/24/2020  Medication Sig  . acetaminophen (TYLENOL) 325 MG tablet Take 2 tablets (650 mg total) by mouth every 6 (six) hours as needed for mild pain or headache (fever >/= 101).  Marland Kitchen albuterol (VENTOLIN HFA) 108 (90 Base) MCG/ACT inhaler Inhale 2 puffs into the lungs every 4 (four) hours as needed for wheezing or shortness of breath.  Marland Kitchen amLODipine (NORVASC) 5 MG tablet Take 1 tablet (5 mg total)  by mouth daily.  Marland Kitchen aspirin EC 81 MG tablet Take 1 tablet (81 mg total) by mouth daily.  . bisacodyl (DULCOLAX) 10 MG suppository Place 10 mg rectally daily as needed for moderate constipation.  . magnesium hydroxide (MILK OF MAGNESIA) 400 MG/5ML suspension Take 30 mLs by mouth daily as needed for mild constipation.  . Nutritional Supplement LIQD Take 120 mLs by mouth 2 (two) times daily. MedPass  . ondansetron (ZOFRAN) 4 MG tablet Take 1 tablet (4 mg total) by mouth every 6 (six) hours as needed for nausea.  . polyethylene glycol (MIRALAX / GLYCOLAX) 17 g packet Take 17 g by mouth daily as needed for mild constipation.  . SODIUM PHOSPHATES RE Place 1 each rectally daily as needed.   No facility-administered encounter medications on file as of 03/24/2020.    Review of Systems  GENERAL: No change in appetite, no fatigue, no weight changes, no fever, chills or weakness MOUTH and THROAT: Denies oral discomfort, gingival pain or bleeding  RESPIRATORY: no cough, SOB, DOE, wheezing, hemoptysis CARDIAC: No chest pain, edema or palpitations GI: No abdominal pain, diarrhea, constipation, heart burn, nausea or vomiting GU: Denies dysuria, frequency, hematuria, incontinence, or discharge NEUROLOGICAL: Denies dizziness, syncope, numbness, or headache PSYCHIATRIC: Denies feelings of depression or anxiety. No report of hallucinations, insomnia, paranoia, or agitation   Immunization History  Administered Date(s) Administered  . Influenza, High Dose Seasonal PF 08/14/2014  . Pneumococcal Polysaccharide-23 03/04/2020   Pertinent  Health Maintenance Due  Topic Date Due  .  COLONOSCOPY  Never done  . INFLUENZA VACCINE  06/15/2020  . PNA vac Low Risk Adult (2 of 2 - PCV13) 03/04/2021   No flowsheet data found.   Vitals:   03/24/20 1552  BP: (!) 155/108  Pulse: (!) 102  Resp: 16  Temp: 97.7 F (36.5 C)  Weight: 156 lb (70.8 kg)  Height: 5\' 8"  (1.727 m)   Body mass index is 23.72  kg/m.  Physical Exam  GENERAL APPEARANCE: Well nourished. In no acute distress. Normal body habitus SKIN:  Skin is warm and dry.  MOUTH and THROAT: Lips are without lesions. Oral mucosa is moist and without lesions. Tongue is normal in shape, size, and color and without lesions RESPIRATORY: Breathing is even & unlabored, BS CTAB CARDIAC: RRR, no murmur,no extra heart sounds, no edema GI: Abdomen soft, normal BS, no masses, no tenderness NEUROLOGICAL: There is no tremor. Speech is clear. Alert to self, disoriented to time and place. PSYCHIATRIC:  Affect and behavior are appropriate  Labs reviewed: Recent Labs    03/01/20 0827 03/02/20 0453 03/04/20 0319  NA 139 140 139  K 3.1* 3.9 3.6  CL 106 108 109  CO2 21* 20* 20*  GLUCOSE 102* 109* 111*  BUN 17 17 21   CREATININE 1.19 1.19 1.20  CALCIUM 8.3* 9.0 8.7*   Recent Labs    02/29/20 0303 03/01/20 0827 03/02/20 0453  AST 25 33 39  ALT 22 28 38  ALKPHOS 47 54 67  BILITOT 0.5 0.8 1.3*  PROT 5.3* 5.7* 7.3  ALBUMIN 2.4* 2.7* 3.2*   Recent Labs    03/01/20 0827 03/02/20 0453 03/04/20 0319  WBC 8.0 12.5* 12.0*  NEUTROABS 5.3 9.1* 8.3*  HGB 16.5 19.2* 17.8*  HCT 47.5 55.1* 51.2  MCV 92.1 91.7 90.8  PLT 244 330 353   Lab Results  Component Value Date   TSH 0.631 02/29/2020   Lab Results  Component Value Date   HGBA1C 5.6 02/26/2020   No results found for: CHOL, HDL, LDLCALC, LDLDIRECT, TRIG, CHOLHDL  Significant Diagnostic Results in last 30 days:  CT Head Wo Contrast  Result Date: 02/27/2020 CLINICAL DATA:  Encephalopathy, weakness, COVID-19 positive EXAM: CT HEAD WITHOUT CONTRAST TECHNIQUE: Contiguous axial images were obtained from the base of the skull through the vertex without intravenous contrast. COMPARISON:  None. FINDINGS: Brain: Confluent hypodensities throughout the periventricular white matter are most consistent with chronic small vessel ischemic changes. No other signs of acute infarct or hemorrhage.  Lateral ventricles and remaining midline structures are unremarkable. No acute extra-axial fluid collections. No mass effect. Vascular: No hyperdense vessel or unexpected calcification. Skull: Normal. Negative for fracture or focal lesion. Sinuses/Orbits: No acute finding. Other: None. IMPRESSION: 1. 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 2. No acute intracranial process. Electronically Signed   By: 02/28/2020 M.D.   On: 02/27/2020 01:10   DG Chest Port 1 View  Result Date: 02/27/2020 CLINICAL DATA:  Possible sepsis EXAM: PORTABLE CHEST 1 VIEW COMPARISON:  None. FINDINGS: Increased opacity in the medial right lung base. Small left pleural effusion. Mild cardiomegaly. IMPRESSION: Increased opacity in the medial right lung base, which may indicate developing consolidation. Small left pleural effusion. Electronically Signed   By: 02/29/2020 M.D.   On: 02/27/2020 00:25    Assessment/Plan  1. Essential hypertension - BPs noted to be elevated, will increase Amlodipine from 5 mg to 10 mg daily, monitor BPs  2. Psychosis, unspecified psychosis type (HCC) -Mood is stable,  will continue Seroquel 12.5 mg twice a day -Monitor behaviors   Family/ staff Communication: Discussed plan of care with resident and charge nurse.  Labs/tests ordered: None  Goals of care:   Short-term care   Durenda Age, DNP, FNP-BC [facility] [phone] (Monday-Friday 8:00 a.m. - 5:00 p.m.) [phone] (after hours)

## 2020-03-27 ENCOUNTER — Non-Acute Institutional Stay (SKILLED_NURSING_FACILITY): Payer: Medicare Other | Admitting: Adult Health

## 2020-03-27 ENCOUNTER — Encounter: Payer: Self-pay | Admitting: Adult Health

## 2020-03-27 DIAGNOSIS — R4189 Other symptoms and signs involving cognitive functions and awareness: Secondary | ICD-10-CM | POA: Diagnosis not present

## 2020-03-27 DIAGNOSIS — M25561 Pain in right knee: Secondary | ICD-10-CM | POA: Diagnosis not present

## 2020-03-27 DIAGNOSIS — Z8673 Personal history of transient ischemic attack (TIA), and cerebral infarction without residual deficits: Secondary | ICD-10-CM

## 2020-03-27 NOTE — Progress Notes (Signed)
Location:  [facility] Room Number: 113-B Place of Service:  SNF (31) Provider:  Kenard Gower, DNP, FNP-BC  Patient Care Team: [name], MD as PCP - General (Family Medicine)  Extended Emergency Contact Information Primary Emergency Contact: Pascal Lux of [employer] Mobile Phone: [phone] Relation: Son Secondary Emergency Contact: Gillermina Hu Home Phone: [phone] Relation: Relative  Code Status:  FULL CODE  Goals of care: Advanced Directive information Advanced Directives 02/26/2020  Does Patient Have a Medical Advance Directive? No  Would patient like information on creating a medical advance directive? No - Patient declined     Chief Complaint  Patient presents with  . Acute Visit    Patient seen for right knee pain.     HPI:  Pt is a 74 y.o. male who is complaining of right knee pain. X-ray of the right hip and knee was negative for fracture but showed osteoarthritis.  X-ray imaging was personally reviewed by Dr. Alwyn Ren and did not note any fracture. He is not able to lift his right leg. Right knee is tender to touch. No edema noted He is currently having short-term rehabilitation. Latest BIMS score 3/15, ranging in severe cognitive impairment.  He has a PMH of hypertension, hyperlipidemia and history of CVA.   He was admitted to [facility] on 03/04/20 post hospitalization 02/26/20 to 03/04/20. He was found confused and wandering outside of a local [employer] [facility] days and was brought to the ED. CT head showed no acute abnormalities. He tested positive for COVID-19 antigen. Chest x-ray showed bibasilar patchy infiltrates, concerning for atypical COVID-19 pneumonia. He was treated with steroids and remdesivir, Zithromax, Rocephin, vancomycin and Zosyn.   Past Medical History:  Diagnosis Date  . Essential hypertension   . GERD without esophagitis   . Gout    History reviewed. No  pertinent surgical history.  Allergies  Allergen Reactions  . Atorvastatin Itching and Rash    Outpatient Encounter Medications as of 03/27/2020  Medication Sig  . acetaminophen (TYLENOL) 325 MG tablet Take 2 tablets (650 mg total) by mouth every 6 (six) hours as needed for mild pain or headache (fever >/= 101).  Marland Kitchen albuterol (VENTOLIN HFA) 108 (90 Base) MCG/ACT inhaler Inhale 2 puffs into the lungs every 4 (four) hours as needed for wheezing or shortness of breath.  Marland Kitchen amLODipine (NORVASC) 5 MG tablet Take 1 tablet (5 mg total) by mouth daily.  Marland Kitchen aspirin EC 81 MG tablet Take 1 tablet (81 mg total) by mouth daily.  . bisacodyl (DULCOLAX) 10 MG suppository Place 10 mg rectally daily as needed for moderate constipation.  . diclofenac Sodium (VOLTAREN) 1 % GEL Apply 2 g topically 2 (two) times daily.  . magnesium hydroxide (MILK OF MAGNESIA) 400 MG/5ML suspension Take 30 mLs by mouth daily as needed for mild constipation.  . Nutritional Supplement LIQD Take 120 mLs by mouth 2 (two) times daily. MedPass  . ondansetron (ZOFRAN) 4 MG tablet Take 1 tablet (4 mg total) by mouth every 6 (six) hours as needed for nausea.  . polyethylene glycol (MIRALAX / GLYCOLAX) 17 g packet Take 17 g by mouth daily as needed for mild constipation.  . QUEtiapine (SEROQUEL) 25 MG tablet Take 12.5 mg by mouth 2 (two) times daily.  . SODIUM PHOSPHATES RE Place 1 each rectally daily as needed.   No facility-administered encounter medications on file as of 03/27/2020.    Review of Systems  GENERAL: No  change in appetite, no fatigue, no weight changes, no fever or chills MOUTH and THROAT: Denies oral discomfort, gingival pain or bleeding, pain from teeth or hoarseness   RESPIRATORY: no cough, SOB, DOE, wheezing, hemoptysis CARDIAC: No chest pain, edema or palpitations GI: No abdominal pain, diarrhea, constipation, heart burn, nausea or vomiting GU: Denies dysuria, frequency, hematuria, incontinence, or  discharge MUSCULOSKELETAL: complains of joint pain NEUROLOGICAL: Denies dizziness, syncope, numbness, or headache PSYCHIATRIC: Denies feelings of depression or anxiety. No report of hallucinations, insomnia, paranoia, or agitation   Immunization History  Administered Date(s) Administered  . Influenza, High Dose Seasonal PF 08/14/2014  . Pneumococcal Polysaccharide-23 03/04/2020   Pertinent  Health Maintenance Due  Topic Date Due  . COLONOSCOPY  Never done  . INFLUENZA VACCINE  06/15/2020  . PNA vac Low Risk Adult (2 of 2 - PCV13) 03/04/2021   No flowsheet data found.   Vitals:   03/27/20 1614  BP: 120/72  Pulse: 92  Temp: (!) 97.1 F (36.2 C)  TempSrc: Oral  Weight: 156 lb (70.8 kg)  Height: 5\' 8"  (1.727 m)   Body mass index is 23.72 kg/m.  Physical Exam  GENERAL APPEARANCE: Well nourished. In no acute distress. Normal body habitus SKIN:  Skin is warm and dry.  MOUTH and THROAT: Lips are without lesions. Oral mucosa is moist and without lesions. Tongue is normal in shape, size, and color and without lesions RESPIRATORY: Breathing is even & unlabored, BS CTAB CARDIAC: RRR, no murmur,no extra heart sounds, no edema GI: Abdomen soft, normal BS, no masses, no tenderness NEUROLOGICAL: There is no tremor. Speech is clear. Alert to self, disoriented to time and place. Unable to lift right leg. PSYCHIATRIC:  Affect and behavior are appropriate  Labs reviewed: Recent Labs    03/01/20 0827 03/01/20 0827 03/02/20 0453 03/04/20 0319 03/12/20 0000  NA 139   < > 140 139 145  K 3.1*   < > 3.9 3.6 4.3  CL 106   < > 108 109 108  CO2 21*   < > 20* 20* 21  GLUCOSE 102*  --  109* 111*  --   BUN 17   < > 17 21 16   CREATININE 1.19   < > 1.19 1.20 1.0  CALCIUM 8.3*   < > 9.0 8.7* 9.1   < > = values in this interval not displayed.   Recent Labs    02/29/20 0303 02/29/20 0303 03/01/20 0827 03/02/20 0453 03/12/20 0000  AST 25   < > 33 39 35  ALT 22   < > 28 38 64*   ALKPHOS 47   < > 54 67 100  BILITOT 0.5  --  0.8 1.3*  --   PROT 5.3*  --  5.7* 7.3  --   ALBUMIN 2.4*   < > 2.7* 3.2* 3.3*   < > = values in this interval not displayed.   Recent Labs    03/01/20 0827 03/01/20 0827 03/02/20 0453 03/04/20 0319 03/12/20 0000  WBC 8.0   < > 12.5* 12.0* 12.6  NEUTROABS 5.3   < > 9.1* 8.3* 9  HGB 16.5   < > 19.2* 17.8* 16.9  HCT 47.5   < > 55.1* 51.2 51  MCV 92.1  --  91.7 90.8  --   PLT 244   < > 330 353 319   < > = values in this interval not displayed.   Lab Results  Component Value Date   TSH 0.631  02/29/2020   Lab Results  Component Value Date   HGBA1C 5.6 02/26/2020    Significant Diagnostic Results in last 30 days:  CT Head Wo Contrast  Result Date: 02/27/2020 CLINICAL DATA:  Encephalopathy, weakness, COVID-19 positive EXAM: CT HEAD WITHOUT CONTRAST TECHNIQUE: Contiguous axial images were obtained from the base of the skull through the vertex without intravenous contrast. COMPARISON:  None. FINDINGS: Brain: Confluent hypodensities throughout the periventricular white matter are most consistent with chronic small vessel ischemic changes. No other signs of acute infarct or hemorrhage. Lateral ventricles and remaining midline structures are unremarkable. No acute extra-axial fluid collections. No mass effect. Vascular: No hyperdense vessel or unexpected calcification. Skull: Normal. Negative for fracture or focal lesion. Sinuses/Orbits: No acute finding. Other: None. IMPRESSION: 1. 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 2. No acute intracranial process. Electronically Signed   By: Randa Ngo M.D.   On: 02/27/2020 01:10   DG Chest Port 1 View  Result Date: 02/27/2020 CLINICAL DATA:  Possible sepsis EXAM: PORTABLE CHEST 1 VIEW COMPARISON:  None. FINDINGS: Increased opacity in the medial right lung base. Small left pleural effusion. Mild cardiomegaly. IMPRESSION: Increased opacity in the medial right lung  base, which may indicate developing consolidation. Small left pleural effusion. Electronically Signed   By: Ulyses Jarred M.D.   On: 02/27/2020 00:25    Assessment/Plan  1. Acute pa in of right knee - x-ray was negative for fracture and showed osteoarthritis, will start on Diclofenac 1% gel apply 4 g to right knee topically 4 times daily  2. History of CVA (cerebrovascular accident) -Continue aspirin EC 81 mg one tab daily  3. Cognitive deficits - BIMS score 3/15, range of severe exercising/skills deficit -Currently having speech therapy   Family/ staff Communication:  Discussed plan of care with resident and charge nurse.  Labs/tests ordered:  None  Goals of care:   Short-term care   Durenda Age, DNP, FNP-BC [facility] [phone] (Monday-Friday 8:00 a.m. - 5:00 p.m.) [phone] (after hours)

## 2020-03-31 ENCOUNTER — Encounter: Payer: Self-pay | Admitting: Neurology

## 2020-03-31 ENCOUNTER — Ambulatory Visit (INDEPENDENT_AMBULATORY_CARE_PROVIDER_SITE_OTHER): Payer: Medicare Other | Admitting: Neurology

## 2020-03-31 ENCOUNTER — Other Ambulatory Visit: Payer: Self-pay

## 2020-03-31 ENCOUNTER — Telehealth: Payer: Self-pay | Admitting: Neurology

## 2020-03-31 VITALS — BP 145/92 | HR 115 | Temp 97.8°F

## 2020-03-31 DIAGNOSIS — F0390 Unspecified dementia without behavioral disturbance: Secondary | ICD-10-CM | POA: Insufficient documentation

## 2020-03-31 DIAGNOSIS — R269 Unspecified abnormalities of gait and mobility: Secondary | ICD-10-CM | POA: Diagnosis not present

## 2020-03-31 DIAGNOSIS — M25561 Pain in right knee: Secondary | ICD-10-CM

## 2020-03-31 DIAGNOSIS — F03918 Unspecified dementia, unspecified severity, with other behavioral disturbance: Secondary | ICD-10-CM | POA: Insufficient documentation

## 2020-03-31 NOTE — Progress Notes (Signed)
PATIENT: Vincent Cummings DOB: Apr 08, 1946  Chief Complaint  Patient presents with  . Altered Mental Status    Currently at [facility] for rehab following hospitalization in April 2021. He is here alone today and very confused. MMSE 10/30 - 1 animal named.   Marland Kitchen PCP    Vincent Limes, MD (referral place by hospital)     [place] 74 year old male, seen in request by his primary care physician Dr. Zenaida Cummings for right lower extremity pain, initial evaluation was on Apr 09, 2020. Patient was brought in by his current [facility], I have to call his facility charge nurse Vincent Cummings for more information.   I also reviewed and summarized the hospital discharge summary on March 04, 2020.  He has past medical history of hypertension, gout, was brought into [facility] emergency department via EMS on February 27, 2020 after being found wandering outside of a restaurant, confused, per record, he was not able to provide any history upon hospital admission, he apparently has underlying dementia, could not remember how many children he has, could not remember their name either.  Upon admission, COVID-19 test was positive, chest x-ray revealed bibasilar patchy infiltration, consistent with COVID-19 pneumonia, tachycardia, fever with 101.3, oxygen saturation of 90% on arrival, he require nasal cannula oxygen, family was contacted by social worker during hospital admission, he was discharged to [facility] since March 04, 2020, health and human services was involved.  Per his facility nurse Vincent Cummings, his only contact, his son stationed in [place], patient remained confused since he came to the nursing home, could not participate in physical therapy, complains of excessive right leg pain with passive movement, there was also noticeable left elbow soft tissue mass  I personally reviewed CT head without contrast, generalized atrophy,  extensive chronic small vessel disease  Laboratory evaluation in 2021, normal CBC, mild elevated ALT 64, normal AST, hemoglobin of 15.5, ferritin of 1162, normal B12, TSH, negative RPR, HIV, D-dimer was elevated 0.79, negative UDS,  REVIEW OF SYSTEMS: Full 14 system review of systems performed and notable only for as above All other review of systems were negative.  ALLERGIES: Allergies  Allergen Reactions  . Atorvastatin Itching and Rash    HOME MEDICATIONS: Current Outpatient Medications  Medication Sig Dispense Refill  . acetaminophen (TYLENOL) 325 MG tablet Take 2 tablets (650 mg total) by mouth every 6 (six) hours as needed for mild pain or headache (fever >/= 101).    Marland Kitchen albuterol (VENTOLIN HFA) 108 (90 Base) MCG/ACT inhaler Inhale 2 puffs into the lungs every 4 (four) hours as needed for wheezing or shortness of breath.    Marland Kitchen amLODipine (NORVASC) 10 MG tablet Take 10 mg by mouth daily.    Marland Kitchen aspirin EC 81 MG tablet Take 1 tablet (81 mg total) by mouth daily.    . bisacodyl (DULCOLAX) 10 MG suppository Place 10 mg rectally daily as needed for moderate constipation.    . diclofenac Sodium (VOLTAREN) 1 % GEL Apply 2 g topically 2 (two) times daily.    . magnesium hydroxide (MILK OF MAGNESIA) 400 MG/5ML suspension Take 30 mLs by mouth daily as needed for mild constipation.    . Nutritional Supplement LIQD Take 120 mLs by mouth 2 (two) times daily. MedPass    . ondansetron (ZOFRAN) 4 MG tablet Take 1 tablet (4 mg total) by mouth every 6 (six) hours as needed for nausea. 20 tablet 0  .  polyethylene glycol (MIRALAX / GLYCOLAX) 17 g packet Take 17 g by mouth daily as needed for mild constipation. 14 each 0  . QUEtiapine (SEROQUEL) 25 MG tablet Take 12.5 mg by mouth 2 (two) times daily.    . SODIUM PHOSPHATES RE Place 1 each rectally daily as needed.     No current facility-administered medications for this visit.    PAST MEDICAL HISTORY: Past Medical History:  Diagnosis Date  . Acute  respiratory failure with hypoxia (HCC)   . Cognitive communication deficit   . Constipation   . Dysphagia   . Essential hypertension   . GERD without esophagitis   . Gout   . History of COVID-19   . Metabolic encephalopathy   . Muscle weakness   . Other sequelae of cerebral infarction   . Pneumonia due to COVID-19 virus 2019    PAST SURGICAL HISTORY: History reviewed. No pertinent surgical history.  FAMILY HISTORY: History reviewed. No pertinent family history.  SOCIAL HISTORY: Social History   Socioeconomic History  . Marital status: Widowed    Spouse name: Not on file  . Number of children: 4  . Years of education: Not on file  . Highest education level: Not on file  Occupational History  . Not on file  Tobacco Use  . Smoking status: Never Smoker  . Smokeless tobacco: Never Used  Substance and Sexual Activity  . Alcohol use: No    Alcohol/week: 0.0 standard drinks  . Drug use: Not on file  . Sexual activity: Not on file  Other Topics Concern  . Not on file  Social History Narrative   Currently in rehab at [facility].   Right-handed.   Social Determinants of Health   Financial Resource Strain:   . Difficulty of Paying Living Expenses:   Food Insecurity:   . Worried About Programme researcher, broadcasting/film/video in the Last Year:   . Barista in the Last Year:   Transportation Needs:   . Freight forwarder (Medical):   Marland Kitchen Lack of Transportation (Non-Medical):   Physical Activity:   . Days of Exercise per Week:   . Minutes of Exercise per Session:   Stress:   . Feeling of Stress :   Social Connections:   . Frequency of Communication with Friends and Family:   . Frequency of Social Gatherings with Friends and Family:   . Attends Religious Services:   . Active Member of Clubs or Organizations:   . Attends Banker Meetings:   Marland Kitchen Marital Status:   Intimate Partner Violence:   . Fear of Current or Ex-Partner:   . Emotionally Abused:   Marland Kitchen  Physically Abused:   . Sexually Abused:      PHYSICAL EXAM   Vitals:   03/31/20 1314  BP: (!) 145/92  Pulse: (!) 115  Temp: 97.8 F (36.6 C)    Not recorded      There is no height or weight on file to calculate BMI.  PHYSICAL EXAMNIATION:  Gen: NAD, conversant, well nourised, well groomed                     Cardiovascular: Regular rate rhythm, no peripheral edema, warm, nontender. Eyes: Conjunctivae clear without exudates or hemorrhage Neck: Supple, no carotid bruits. Pulmonary: Clear to auscultation bilaterally   NEUROLOGICAL EXAM:  MENTAL STATUS: Speech/cognition: Patient was confused, was not able to provide any meaningful history, he is trying to be cooperative, know  his name, speak fluent English  CRANIAL NERVES: CN II: Visual fields are full to confrontation. Pupils are round equal and briskly reactive to light. CN III, IV, VI: extraocular movement are normal. No ptosis. CN V: Facial sensation is intact to light touch CN VII: Face is symmetric with normal eye closure  CN VIII: Hearing is normal to causal conversation. CN IX, X: Phonation is normal. CN XI: Head turning and shoulder shrug are intact  MOTOR: He moves bilateral upper extremity, left lower extremity fairly freely, screaming for pain with passive movement of right lower extremity,mild swollen of right knee, felt warmer than the left side, screaming per pain with deep palpitation  REFLEXES: Reflexes are hypoactive and symmetric at the biceps, triceps, knees, and ankles. Plantar responses are flexor.  SENSORY: Withdrawal to pain  COORDINATION: There is no trunk or limb dysmetria noted.  GAIT/STANCE: Deferred  DIAGNOSTIC DATA (LABS, IMAGING, TESTING) - I reviewed patient records, labs, notes, testing and imaging myself where available.   ASSESSMENT AND PLAN  Vincent Cummings is a 74 y.o. male   Dementia Right lower extremity, right knee pain,  His complaints of right lower extremity  pain are most likely related to right knee pathology,  I ordered imaging study of right knee,  Will follow up by his facility physician, only return to clinic for new issues  Total time spent reviewing the chart, obtaining history, examined patient, ordering tests, documentation, consultations and family, care coordination was 60 minutes    Levert Feinstein, M.D. Ph.D.  [facility] [address] [place], [place] [address] Ph: [phone] Fax: [phone]  CC: Referring Provider

## 2020-03-31 NOTE — Telephone Encounter (Signed)
Medicare/tricare order sent to GI. No auth they will reach out to the patient to schedule.  

## 2020-04-01 ENCOUNTER — Non-Acute Institutional Stay (SKILLED_NURSING_FACILITY): Payer: Medicare Other | Admitting: Adult Health

## 2020-04-01 ENCOUNTER — Encounter: Payer: Self-pay | Admitting: Adult Health

## 2020-04-01 DIAGNOSIS — M25561 Pain in right knee: Secondary | ICD-10-CM

## 2020-04-01 DIAGNOSIS — R4189 Other symptoms and signs involving cognitive functions and awareness: Secondary | ICD-10-CM

## 2020-04-01 DIAGNOSIS — F29 Unspecified psychosis not due to a substance or known physiological condition: Secondary | ICD-10-CM

## 2020-04-01 DIAGNOSIS — I10 Essential (primary) hypertension: Secondary | ICD-10-CM | POA: Diagnosis not present

## 2020-04-01 DIAGNOSIS — Z1159 Encounter for screening for other viral diseases: Secondary | ICD-10-CM | POA: Diagnosis not present

## 2020-04-01 DIAGNOSIS — Z1211 Encounter for screening for malignant neoplasm of colon: Secondary | ICD-10-CM

## 2020-04-01 NOTE — Progress Notes (Signed)
Location:  [place] Room Number: 113-B Place of Service:  SNF (31) Provider:  Durenda Age, DNP, FNP-BC  Patient Care Team: Hendricks Limes, MD as PCP - General (Internal Medicine)  Extended Emergency Contact Information Primary Emergency Contact: Sharion Dove of [place] Mobile Phone: [phone] Relation: Son Secondary Emergency Contact: Haywood Filler Home Phone: [phone] Relation: Relative  Code Status:  FULL CODE  Goals of care: Advanced Directive information Advanced Directives 02/26/2020  Does Patient Have a Medical Advance Directive? No  Would patient like information on creating a medical advance directive? No - Patient declined     Chief Complaint  Patient presents with  . Medical Management of Chronic Issues    Routine [facility] visit  . Quality Metric Gaps    Colon Ca screening  . Best Practice Recommendations    Needs hepatitis C screen    HPI:  Pt is a 74 y.o. male seen today for medical management of chronic diseases. He is a short-term rehabilitation resident of [facility]. He has a PMH of hypertension, hyperlipidemia, GERD, cerebral infarction and Covid-19 pneumonia. He is unable to move right leg. No redness or edema noted to RLE. X-ray of right hip and right knee were negative for fracture. New order for Diclofenac gel 1% was started. SBPs ranging from 110 to 147. He takes Amlodipine for hypertension.    Past Medical History:  Diagnosis Date  . Acute respiratory failure with hypoxia ([place])   . Cognitive communication deficit   . Constipation   . Dysphagia   . Essential hypertension   . GERD without esophagitis   . Gout   . History of COVID-19   . Metabolic encephalopathy   . Muscle weakness   . Other sequelae of cerebral infarction   . Pneumonia due to COVID-19 virus 2019   History reviewed. No pertinent surgical history.  Allergies  Allergen Reactions  .  Atorvastatin Itching and Rash    Outpatient Encounter Medications as of 04/01/2020  Medication Sig  . acetaminophen (TYLENOL) 325 MG tablet Take 2 tablets (650 mg total) by mouth every 6 (six) hours as needed for mild pain or headache (fever >/= 101).  Marland Kitchen albuterol (VENTOLIN HFA) 108 (90 Base) MCG/ACT inhaler Inhale 2 puffs into the lungs every 4 (four) hours as needed for wheezing or shortness of breath.  Marland Kitchen amLODipine (NORVASC) 10 MG tablet Take 10 mg by mouth daily.  Marland Kitchen aspirin EC 81 MG tablet Take 1 tablet (81 mg total) by mouth daily.  . bisacodyl (DULCOLAX) 10 MG suppository Place 10 mg rectally daily as needed for moderate constipation.  . diclofenac Sodium (VOLTAREN) 1 % GEL Apply 2 g topically 2 (two) times daily.  . magnesium hydroxide (MILK OF MAGNESIA) 400 MG/5ML suspension Take 30 mLs by mouth daily as needed for mild constipation.  . Nutritional Supplement LIQD Take 120 mLs by mouth in the morning, at noon, and at bedtime. MedPass   . ondansetron (ZOFRAN) 4 MG tablet Take 1 tablet (4 mg total) by mouth every 6 (six) hours as needed for nausea.  . polyethylene glycol (MIRALAX / GLYCOLAX) 17 g packet Take 17 g by mouth daily as needed for mild constipation.  . QUEtiapine (SEROQUEL) 25 MG tablet Take 12.5 mg by mouth 2 (two) times daily.  . SODIUM PHOSPHATES RE Place 1 each rectally daily as needed.   No facility-administered encounter medications on file as of 04/01/2020.    Review of Systems  GENERAL: No change in appetite, no fatigue, no weight changes, no fever, chills or weakness SKIN: Denies rash, itching, wounds, ulcer sores, or nail abnormalities EYES: Denies change in vision, dry eyes, eye pain, itching or discharge EARS: Denies change in hearing, ringing in ears, or earache NOSE: Denies nasal congestion or epistaxis MOUTH and THROAT: Denies oral discomfort, gingival pain or bleeding, pain from teeth or hoarseness   RESPIRATORY: no cough, SOB, DOE, wheezing,  hemoptysis CARDIAC: No chest pain, edema or palpitations GI: No abdominal pain, diarrhea, constipation, heart burn, nausea or vomiting GU: Denies dysuria, frequency, hematuria, incontinence, or discharge MUSCULOSKELETAL: Denies joint pain, muscle pain, back pain, restricted movement, or unusual weakness CIRCULATION: Denies claudication, edema of legs, varicosities, or cold extremities NEUROLOGICAL: Denies dizziness, syncope, numbness, or headache PSYCHIATRIC: Denies feelings of depression or anxiety. No report of hallucinations, insomnia, paranoia, or agitation ENDOCRINE: Denies polyphagia, polyuria, polydipsia, heat or cold intolerance HEME/LYMPH: Denies excessive bruising, petechia, enlarged lymph nodes, or bleeding problems IMMUNOLOGIC: Denies history of frequent infections, AIDS, or use of immunosuppressive agents   Immunization History  Administered Date(s) Administered  . Influenza, High Dose Seasonal PF 08/14/2014  . Pneumococcal Polysaccharide-23 03/04/2020   Pertinent  Health Maintenance Due  Topic Date Due  . COLONOSCOPY  Never done  . INFLUENZA VACCINE  06/15/2020  . PNA vac Low Risk Adult (2 of 2 - PCV13) 03/04/2021    Vitals:   04/01/20 1144  BP: 140/90  Pulse: 92  Resp: 20  Temp: 98.6 F (37 C)  TempSrc: Oral  Weight: 157 lb 3.2 oz (71.3 kg)  Height: 5\' 8"  (1.727 m)   Body mass index is 23.9 kg/m.  Physical Exam  GENERAL APPEARANCE: Well nourished. In no acute distress. Normal body habitus SKIN:  Skin is warm and dry.  MOUTH and THROAT: Lips are without lesions. Oral mucosa is moist and without lesions. Tongue is normal in shape, size, and color and without lesions RESPIRATORY: Breathing is even & unlabored, BS CTAB CARDIAC: RRR, no murmur,no extra heart sounds, no edema GI: Abdomen soft, normal BS, no masses, no tenderness NEUROLOGICAL: There is no tremor. Speech is clear. Alert to self, disoriented to time and place. Unable to move RLE PSYCHIATRIC:   Affect and behavior are appropriate  Labs reviewed: Recent Labs    03/01/20 0827 03/01/20 0827 03/02/20 0453 03/04/20 0319 03/12/20 0000  NA 139   < > 140 139 145  K 3.1*   < > 3.9 3.6 4.3  CL 106   < > 108 109 108  CO2 21*   < > 20* 20* 21  GLUCOSE 102*  --  109* 111*  --   BUN 17   < > 17 21 16   CREATININE 1.19   < > 1.19 1.20 1.0  CALCIUM 8.3*   < > 9.0 8.7* 9.1   < > = values in this interval not displayed.   Recent Labs    02/29/20 0303 02/29/20 0303 03/01/20 0827 03/02/20 0453 03/12/20 0000  AST 25   < > 33 39 35  ALT 22   < > 28 38 64*  ALKPHOS 47   < > 54 67 100  BILITOT 0.5  --  0.8 1.3*  --   PROT 5.3*  --  5.7* 7.3  --   ALBUMIN 2.4*   < > 2.7* 3.2* 3.3*   < > = values in this interval not displayed.   Recent Labs    03/01/20 0827 03/01/20 0827 03/02/20  3545 03/02/20 0453 03/04/20 0319 03/12/20 0000 03/17/20 0000  WBC 8.0   < > 12.5*   < > 12.0* 12.6 11.3  NEUTROABS 5.3   < > 9.1*   < > 8.3* 9 8  HGB 16.5   < > 19.2*   < > 17.8* 16.9 15.5  HCT 47.5   < > 55.1*   < > 51.2 51 46  MCV 92.1  --  91.7  --  90.8  --   --   PLT 244   < > 330   < > 353 319 275   < > = values in this interval not displayed.   Lab Results  Component Value Date   TSH 0.631 02/29/2020   Lab Results  Component Value Date   HGBA1C 5.6 02/26/2020    Assessment/Plan  1. Right knee pain, unspecified chronicity - x-ray was negative for fracture - continue Diclofenac 1% gel and PRN Acetaminophen  2. Essential hypertension - stable, continue amlodipine  3. Psychosis, unspecified psychosis type (HCC) - no recent reports of agitation, continue Quetiapine  4. Cognitive deficits - BIMS score 3/15, SLUNS score 0/30, dementia range - continue supportive care, fall precautions.  5. Colon cancer screening - stool occult blood X 3  6. Need for hepatitis C screening test - check for HCV antibody    Family/ staff Communication: Discussed plan of care with resident and  charge nurse.  Labs/tests ordered: Stool occult blood x3 and hepatitis C antibody  Goals of care:   Short-term care   Kenard Gower, DNP, FNP-BC [facility] [phone] (Monday-Friday 8:00 a.m. - 5:00 p.m.) [phone] (after hours)

## 2020-04-04 ENCOUNTER — Other Ambulatory Visit: Payer: Self-pay | Admitting: *Deleted

## 2020-04-04 NOTE — Patient Outreach (Signed)
Member screened for potential [facility] Care Management needs as a benefit of [employer].  Spoke with [employer]. Member to transition to long term care at the facility.  No identifiable [facility] Care Management needs at this time.   Raiford Noble, MSN-Ed, RN,BSN [facility] Acute Care Coordinator [phone] [facility]) [phone]  (Toll free office)

## 2020-04-09 ENCOUNTER — Encounter: Payer: Self-pay | Admitting: Neurology

## 2020-04-09 LAB — CBC AND DIFFERENTIAL
HCT: 46 (ref 41–53)
Hemoglobin: 15.1 (ref 13.5–17.5)
Neutrophils Absolute: 3
Platelets: 322 (ref 150–399)
WBC: 7.3

## 2020-04-09 LAB — HM HEPATITIS C SCREENING LAB: HM Hepatitis Screen: NEGATIVE

## 2020-04-09 LAB — CBC: RBC: 4.76 (ref 3.87–5.11)

## 2020-04-24 ENCOUNTER — Non-Acute Institutional Stay (SKILLED_NURSING_FACILITY): Payer: Medicare Other | Admitting: Adult Health

## 2020-04-24 ENCOUNTER — Encounter: Payer: Self-pay | Admitting: Adult Health

## 2020-04-24 DIAGNOSIS — F0391 Unspecified dementia with behavioral disturbance: Secondary | ICD-10-CM | POA: Diagnosis not present

## 2020-04-24 DIAGNOSIS — M25561 Pain in right knee: Secondary | ICD-10-CM

## 2020-04-24 DIAGNOSIS — Z8673 Personal history of transient ischemic attack (TIA), and cerebral infarction without residual deficits: Secondary | ICD-10-CM | POA: Diagnosis not present

## 2020-04-24 DIAGNOSIS — F29 Unspecified psychosis not due to a substance or known physiological condition: Secondary | ICD-10-CM

## 2020-04-24 DIAGNOSIS — W19XXXS Unspecified fall, sequela: Secondary | ICD-10-CM | POA: Diagnosis not present

## 2020-04-24 DIAGNOSIS — I10 Essential (primary) hypertension: Secondary | ICD-10-CM

## 2020-04-24 NOTE — Progress Notes (Signed)
Location:  [facility] Room Number: 113 B Place of Service:  SNF (31) Provider:  Kenard Gower, DNP, FNP-BC  Patient Care Team: Pecola Lawless, MD as PCP - General (Internal Medicine)  Extended Emergency Contact Information Primary Emergency Contact: Pascal Lux of [place] Mobile Phone: [phone] Relation: Son Secondary Emergency Contact: Gillermina Hu Home Phone: [phone] Relation: Relative  Code Status:  Full Code  Goals of care: Advanced Directive information Advanced Directives 02/26/2020  Does Patient Have a Medical Advance Directive? No  Would patient like information on creating a medical advance directive? No - Patient declined     Chief Complaint  Patient presents with  . Medical Management of Chronic Issues    Medical management of chronic diseases    HPI:  Pt is a 74 y.o. male seen today for medical management of chronic diseases. He has a PMH of hypertension, GERD, cerebral infarction and COVID-19 pneumonia. He was seen in his room today. Staff reports that he goes to the bathroom on his  own, transfers himself without asking for help. He fell on 04/16/20 and 04/20/20. No injury was noted. He is verbally responsive but confused to time and place. SBPs ranging from 113 to 143. He takes Amlodipine for hypertension. No reported aggressive behavior.    Past Medical History:  Diagnosis Date  . Acute respiratory failure with hypoxia (HCC)   . Cognitive communication deficit   . Constipation   . Dysphagia   . Essential hypertension   . GERD without esophagitis   . Gout   . History of COVID-19   . Metabolic encephalopathy   . Muscle weakness   . Other sequelae of cerebral infarction   . Pneumonia due to COVID-19 virus 2019   History reviewed. No pertinent surgical history.  Allergies  Allergen Reactions  . Atorvastatin Itching and Rash    Outpatient Encounter Medications as of 04/24/2020  Medication  Sig  . acetaminophen (TYLENOL) 325 MG tablet Take 2 tablets (650 mg total) by mouth every 6 (six) hours as needed for mild pain or headache (fever >/= 101).  Marland Kitchen albuterol (VENTOLIN HFA) 108 (90 Base) MCG/ACT inhaler Inhale 2 puffs into the lungs every 4 (four) hours as needed for wheezing or shortness of breath.  Marland Kitchen amLODipine (NORVASC) 10 MG tablet Take 10 mg by mouth daily.  Marland Kitchen aspirin EC 81 MG tablet Take 1 tablet (81 mg total) by mouth daily.  . bisacodyl (DULCOLAX) 10 MG suppository Place 10 mg rectally daily as needed for moderate constipation.  . diclofenac Sodium (VOLTAREN) 1 % GEL Apply 2 g topically 2 (two) times daily.  . magnesium hydroxide (MILK OF MAGNESIA) 400 MG/5ML suspension Take 30 mLs by mouth daily as needed for mild constipation.  . Nutritional Supplement LIQD Take 120 mLs by mouth in the morning, at noon, and at bedtime. MedPass   . ondansetron (ZOFRAN) 4 MG tablet Take 1 tablet (4 mg total) by mouth every 6 (six) hours as needed for nausea.  . polyethylene glycol (MIRALAX / GLYCOLAX) 17 g packet Take 17 g by mouth daily as needed for mild constipation.  . QUEtiapine (SEROQUEL) 25 MG tablet Take 12.5 mg by mouth 2 (two) times daily.  . SODIUM PHOSPHATES RE Place 1 each rectally daily as needed.   No facility-administered encounter medications on file as of 04/24/2020.    Review of Systems  GENERAL: No change in appetite, no fatigue, no weight changes, no fever, chills or weakness MOUTH  and THROAT: Denies oral discomfort, gingival pain or bleeding RESPIRATORY: no cough, SOB, DOE, wheezing, hemoptysis CARDIAC: No chest pain, edema or palpitations GI: No abdominal pain, diarrhea, constipation, heart burn, nausea or vomiting GU: Denies dysuria, frequency, hematuria, incontinence, or discharge NEUROLOGICAL: Denies dizziness, syncope, numbness, or headache PSYCHIATRIC: Denies feelings of depression or anxiety. No report of hallucinations, insomnia, paranoia, or  agitation   Immunization History  Administered Date(s) Administered  . Influenza, High Dose Seasonal PF 08/14/2014  . Pneumococcal Polysaccharide-23 03/04/2020   Pertinent  Health Maintenance Due  Topic Date Due  . COLONOSCOPY  Never done  . INFLUENZA VACCINE  06/15/2020  . PNA vac Low Risk Adult (2 of 2 - PCV13) 03/04/2021   No flowsheet data found.   Vitals:   04/24/20 1558  BP: 125/88  Pulse: 90  Resp: 18  Temp: 98 F (36.7 C)  Weight: 152 lb 9.6 oz (69.2 kg)  Height: 5\' 8"  (1.727 m)   Body mass index is 23.2 kg/m.  Physical Exam  GENERAL APPEARANCE: Well nourished. In no acute distress. Normal body habitus SKIN:  Skin is warm and dry.  MOUTH and THROAT: Lips are without lesions. Oral mucosa is moist and without lesions.  RESPIRATORY: Breathing is even & unlabored, BS CTAB CARDIAC: RRR, no murmur,no extra heart sounds, no edema GI: Abdomen soft, normal BS, no masses, no tenderness EXTREMITIES:  Able to move X 4 extremities NEUROLOGICAL: There is no tremor. Speech is clear. Alert to self, disoriented to time and place. PSYCHIATRIC:  Affect and behavior are appropriate  Labs reviewed: Recent Labs    03/01/20 0827 03/01/20 0827 03/02/20 0453 03/04/20 0319 03/12/20 0000  NA 139   < > 140 139 145  K 3.1*   < > 3.9 3.6 4.3  CL 106   < > 108 109 108  CO2 21*   < > 20* 20* 21  GLUCOSE 102*  --  109* 111*  --   BUN 17   < > 17 21 16   CREATININE 1.19   < > 1.19 1.20 1.0  CALCIUM 8.3*   < > 9.0 8.7* 9.1   < > = values in this interval not displayed.   Recent Labs    02/29/20 0303 02/29/20 0303 03/01/20 0827 03/02/20 0453 03/12/20 0000  AST 25   < > 33 39 35  ALT 22   < > 28 38 64*  ALKPHOS 47   < > 54 67 100  BILITOT 0.5  --  0.8 1.3*  --   PROT 5.3*  --  5.7* 7.3  --   ALBUMIN 2.4*   < > 2.7* 3.2* 3.3*   < > = values in this interval not displayed.   Recent Labs    03/01/20 0827 03/01/20 0827 03/02/20 0453 03/02/20 0453 03/04/20 0319  03/12/20 0000 03/17/20 0000  WBC 8.0   < > 12.5*   < > 12.0* 12.6 11.3  NEUTROABS 5.3   < > 9.1*   < > 8.3* 9 8  HGB 16.5   < > 19.2*   < > 17.8* 16.9 15.5  HCT 47.5   < > 55.1*   < > 51.2 51 46  MCV 92.1  --  91.7  --  90.8  --   --   PLT 244   < > 330   < > 353 319 275   < > = values in this interval not displayed.   Lab Results  Component Value Date  TSH 0.631 02/29/2020   Lab Results  Component Value Date   HGBA1C 5.6 02/26/2020    Assessment/Plan  1. Fall, sequela - encouraged to wear non skid socks - placed bedside rubber floor mat   2. History of CVA (cerebrovascular accident) - stable, continue ASA 81 mg daily  3. Essential hypertension - stable, continue amlodipine 10 mg 1 tab daily  4. Psychosis, unspecified psychosis type (HCC) -No reported agitation, continue Quetiapine fumarate 25 mg 1/2 tab = 12.5 mg twice a day  5. Right knee pain, unspecified chronicity - improved, now able to stand up -Continue acetaminophen 325 mg 2 tabs  = 650 mg every 6 hours PRN  6. Dementia with behavioral disturbance, unspecified dementia type (HCC) -  Continue supportive care, fall precautions     Family/ staff Communication:  Discussed plan of care with resident and charge nurse.  Labs/tests ordered:  None  Goals of care:  Long-term care   Kenard Gower, DNP, FNP-BC [facility] [phone] (Monday-Friday 8:00 a.m. - 5:00 p.m.) [phone] (after hours)

## 2020-05-15 DIAGNOSIS — M6281 Muscle weakness (generalized): Secondary | ICD-10-CM | POA: Diagnosis not present

## 2020-05-15 DIAGNOSIS — R1311 Dysphagia, oral phase: Secondary | ICD-10-CM | POA: Diagnosis not present

## 2020-05-15 DIAGNOSIS — I69398 Other sequelae of cerebral infarction: Secondary | ICD-10-CM | POA: Diagnosis not present

## 2020-05-15 DIAGNOSIS — G9341 Metabolic encephalopathy: Secondary | ICD-10-CM | POA: Diagnosis not present

## 2020-05-16 DIAGNOSIS — I69398 Other sequelae of cerebral infarction: Secondary | ICD-10-CM | POA: Diagnosis not present

## 2020-05-16 DIAGNOSIS — R1311 Dysphagia, oral phase: Secondary | ICD-10-CM | POA: Diagnosis not present

## 2020-05-16 DIAGNOSIS — M6281 Muscle weakness (generalized): Secondary | ICD-10-CM | POA: Diagnosis not present

## 2020-05-16 DIAGNOSIS — G9341 Metabolic encephalopathy: Secondary | ICD-10-CM | POA: Diagnosis not present

## 2020-05-19 DIAGNOSIS — M6281 Muscle weakness (generalized): Secondary | ICD-10-CM | POA: Diagnosis not present

## 2020-05-19 DIAGNOSIS — G9341 Metabolic encephalopathy: Secondary | ICD-10-CM | POA: Diagnosis not present

## 2020-05-19 DIAGNOSIS — R1311 Dysphagia, oral phase: Secondary | ICD-10-CM | POA: Diagnosis not present

## 2020-05-19 DIAGNOSIS — I69398 Other sequelae of cerebral infarction: Secondary | ICD-10-CM | POA: Diagnosis not present

## 2020-05-20 DIAGNOSIS — R1311 Dysphagia, oral phase: Secondary | ICD-10-CM | POA: Diagnosis not present

## 2020-05-20 DIAGNOSIS — I69398 Other sequelae of cerebral infarction: Secondary | ICD-10-CM | POA: Diagnosis not present

## 2020-05-20 DIAGNOSIS — M6281 Muscle weakness (generalized): Secondary | ICD-10-CM | POA: Diagnosis not present

## 2020-05-20 DIAGNOSIS — G9341 Metabolic encephalopathy: Secondary | ICD-10-CM | POA: Diagnosis not present

## 2020-05-21 DIAGNOSIS — I69398 Other sequelae of cerebral infarction: Secondary | ICD-10-CM | POA: Diagnosis not present

## 2020-05-21 DIAGNOSIS — G9341 Metabolic encephalopathy: Secondary | ICD-10-CM | POA: Diagnosis not present

## 2020-05-21 DIAGNOSIS — M6281 Muscle weakness (generalized): Secondary | ICD-10-CM | POA: Diagnosis not present

## 2020-05-21 DIAGNOSIS — R1311 Dysphagia, oral phase: Secondary | ICD-10-CM | POA: Diagnosis not present

## 2020-05-22 DIAGNOSIS — R1311 Dysphagia, oral phase: Secondary | ICD-10-CM | POA: Diagnosis not present

## 2020-05-22 DIAGNOSIS — M6281 Muscle weakness (generalized): Secondary | ICD-10-CM | POA: Diagnosis not present

## 2020-05-22 DIAGNOSIS — G9341 Metabolic encephalopathy: Secondary | ICD-10-CM | POA: Diagnosis not present

## 2020-05-22 DIAGNOSIS — I69398 Other sequelae of cerebral infarction: Secondary | ICD-10-CM | POA: Diagnosis not present

## 2020-05-23 ENCOUNTER — Non-Acute Institutional Stay (SKILLED_NURSING_FACILITY): Payer: Medicare Other | Admitting: Adult Health

## 2020-05-23 DIAGNOSIS — Z8673 Personal history of transient ischemic attack (TIA), and cerebral infarction without residual deficits: Secondary | ICD-10-CM | POA: Diagnosis not present

## 2020-05-23 DIAGNOSIS — Z1211 Encounter for screening for malignant neoplasm of colon: Secondary | ICD-10-CM

## 2020-05-23 DIAGNOSIS — D72829 Elevated white blood cell count, unspecified: Secondary | ICD-10-CM | POA: Diagnosis not present

## 2020-05-23 DIAGNOSIS — I69398 Other sequelae of cerebral infarction: Secondary | ICD-10-CM | POA: Diagnosis not present

## 2020-05-23 DIAGNOSIS — F29 Unspecified psychosis not due to a substance or known physiological condition: Secondary | ICD-10-CM | POA: Diagnosis not present

## 2020-05-23 DIAGNOSIS — G9341 Metabolic encephalopathy: Secondary | ICD-10-CM | POA: Diagnosis not present

## 2020-05-23 DIAGNOSIS — R1311 Dysphagia, oral phase: Secondary | ICD-10-CM | POA: Diagnosis not present

## 2020-05-23 DIAGNOSIS — I10 Essential (primary) hypertension: Secondary | ICD-10-CM | POA: Diagnosis not present

## 2020-05-23 DIAGNOSIS — M6281 Muscle weakness (generalized): Secondary | ICD-10-CM | POA: Diagnosis not present

## 2020-05-23 DIAGNOSIS — F0391 Unspecified dementia with behavioral disturbance: Secondary | ICD-10-CM

## 2020-05-23 NOTE — Progress Notes (Signed)
This encounter was created in error - please disregard.

## 2020-05-25 ENCOUNTER — Encounter: Payer: Self-pay | Admitting: Adult Health

## 2020-05-25 NOTE — Progress Notes (Signed)
Location:  [facility] Room Number: 118-A Place of Service:  SNF (31) Provider:  Kenard Gower, DNP, FNP-BC  Patient Care Team: Pecola Lawless, MD as PCP - General (Internal Medicine)  Extended Emergency Contact Information Primary Emergency Contact: Pascal Lux of [place] Mobile Phone: [phone] Relation: Son Secondary Emergency Contact: Gillermina Hu Home Phone: [phone] Relation: Relative  Code Status:  FULL CODE  Goals of care: Advanced Directive information Advanced Directives 02/26/2020  Does Patient Have a Medical Advance Directive? No  Would patient like information on creating a medical advance directive? No - Patient declined     Chief Complaint  Patient presents with   Medical Management of Chronic Issues    Routine [facility] visit    HPI:  Pt is a 74 y.o. male seen today for medical management of chronic diseases.  He is a short-term care resident of [facility].  He has a PMH of hypertension, hyperlipidemia, GERD, cerebral infarction and COVID-19 pneumonia.  He was seen today.  He is verbally responsive and interactive.  He stated that he does not want to answer some questions because he thinks that "the less people know about him, the better".  He repeatedly answered, " It is all right" and " It doesn't  matter anyway". He was not agitated, was smiling.  He continues to take Quetiapine for psychosis. Last BIMS score was 3/15, ranging in severe cognitive deficit. Review of labs showed that on 03/27/20, he had wbc 11.3, elevated. No reported fever, coughing nor congestion. He had his first Moderna COVID-19 vaccination  3 days ago. No reported adverse reactions.     Past Medical History:  Diagnosis Date   Acute respiratory failure with hypoxia (HCC)    Cognitive communication deficit    Constipation    Dysphagia    Essential hypertension    GERD without esophagitis     Gout    History of COVID-19    Metabolic encephalopathy    Muscle weakness    Other sequelae of cerebral infarction    Pneumonia due to COVID-19 virus 2019   No past surgical history on file.  Allergies  Allergen Reactions   Atorvastatin Itching and Rash    Outpatient Encounter Medications as of 05/23/2020  Medication Sig   acetaminophen (TYLENOL) 325 MG tablet Take 2 tablets (650 mg total) by mouth every 6 (six) hours as needed for mild pain or headache (fever >/= 101).   albuterol (VENTOLIN HFA) 108 (90 Base) MCG/ACT inhaler Inhale 2 puffs into the lungs every 4 (four) hours as needed for wheezing or shortness of breath.   amLODipine (NORVASC) 10 MG tablet Take 10 mg by mouth daily.   aspirin EC 81 MG tablet Take 1 tablet (81 mg total) by mouth daily.   bisacodyl (DULCOLAX) 10 MG suppository Place 10 mg rectally daily as needed for moderate constipation.   magnesium hydroxide (MILK OF MAGNESIA) 400 MG/5ML suspension Take 30 mLs by mouth daily as needed for mild constipation.   Nutritional Supplement LIQD Take 120 mLs by mouth in the morning and at bedtime.   ondansetron (ZOFRAN) 4 MG tablet Take 1 tablet (4 mg total) by mouth every 6 (six) hours as needed for nausea.   polyethylene glycol (MIRALAX / GLYCOLAX) 17 g packet Take 17 g by mouth daily as needed for mild constipation.   QUEtiapine (SEROQUEL) 25 MG tablet Take 12.5 mg by mouth 2 (two) times daily.   SODIUM PHOSPHATES  RE Place 1 each rectally daily as needed.   No facility-administered encounter medications on file as of 05/23/2020.    Review of Systems  GENERAL: No change in appetite, no fatigue, no weight changes, no fever, chills or weakness MOUTH and THROAT: Denies oral discomfort, gingival pain or bleeding RESPIRATORY: no cough, SOB, DOE, wheezing, hemoptysis CARDIAC: No chest pain, edema or palpitations GI: No abdominal pain, diarrhea, constipation, heart burn, nausea or vomiting GU: Denies dysuria,  frequency, hematuria, incontinence, or discharge NEUROLOGICAL: Denies dizziness, syncope, numbness, or headache PSYCHIATRIC: +paranoia   Immunization History  Administered Date(s) Administered   Influenza, High Dose Seasonal PF 08/14/2014   Moderna SARS-COVID-2 Vaccination 05/23/2020   Pneumococcal Polysaccharide-23 03/04/2020   Pertinent  Health Maintenance Due  Topic Date Due   COLONOSCOPY  Never done   INFLUENZA VACCINE  06/15/2020   PNA vac Low Risk Adult (2 of 2 - PCV13) 03/04/2021   No flowsheet data found.   Vitals:   05/25/20 1923  BP: 130/82  Pulse: 74  Resp: 19  Temp: 97.8 F (36.6 C)  TempSrc: Oral  Weight: 166 lb 6.4 oz (75.5 kg)  Height: 5\' 8"  (1.727 m)   Body mass index is 25.3 kg/m.  Physical Exam  GENERAL APPEARANCE: Well nourished. In no acute distress. Normal body habitus SKIN:  Skin is warm and dry.  MOUTH and THROAT: Lips are without lesions. Oral mucosa is moist and without lesions. Tongue is normal in shape, size, and color and without lesions RESPIRATORY: Breathing is even & unlabored, BS CTAB CARDIAC: RRR, no murmur,no extra heart sounds, no edema GI: Abdomen soft, normal BS, no masses, no tenderness EXTREMITIES: Able to move extremities NEUROLOGICAL: There is no tremor. Speech is clear.  Alert to self, disoriented to time and place. PSYCHIATRIC:  Affect and behavior are appropriate  Labs reviewed: Recent Labs    03/01/20 0827 03/01/20 0827 03/02/20 0453 03/04/20 0319 03/12/20 0000  NA 139   < > 140 139 145  K 3.1*   < > 3.9 3.6 4.3  CL 106   < > 108 109 108  CO2 21*   < > 20* 20* 21  GLUCOSE 102*  --  109* 111*  --   BUN 17   < > 17 21 16   CREATININE 1.19   < > 1.19 1.20 1.0  CALCIUM 8.3*   < > 9.0 8.7* 9.1   < > = values in this interval not displayed.   Recent Labs    02/29/20 0303 02/29/20 0303 03/01/20 0827 03/02/20 0453 03/12/20 0000  AST 25   < > 33 39 35  ALT 22   < > 28 38 64*  ALKPHOS 47   < > 54 67 100    BILITOT 0.5  --  0.8 1.3*  --   PROT 5.3*  --  5.7* 7.3  --   ALBUMIN 2.4*   < > 2.7* 3.2* 3.3*   < > = values in this interval not displayed.   Recent Labs    03/01/20 0827 03/01/20 0827 03/02/20 0453 03/02/20 0453 03/04/20 0319 03/12/20 0000 03/17/20 0000  WBC 8.0   < > 12.5*   < > 12.0* 12.6 11.3  NEUTROABS 5.3   < > 9.1*   < > 8.3* 9 8  HGB 16.5   < > 19.2*   < > 17.8* 16.9 15.5  HCT 47.5   < > 55.1*   < > 51.2 51 46  MCV 92.1  --  91.7  --  90.8  --   --   PLT 244   < > 330   < > 353 319 275   < > = values in this interval not displayed.   Lab Results  Component Value Date   TSH 0.631 02/29/2020   Lab Results  Component Value Date   HGBA1C 5.6 02/26/2020    Assessment/Plan  1. Leukocytosis, unspecified type Lab Results  Component Value Date   WBC 11.3 03/17/2020   -   No reported fever nor cough, will re-check CBC  2. History of CVA (cerebrovascular accident) -Stable, continue aspirin  3. Essential hypertension - BPs stable, continue amlodipine  4. Psychosis, unspecified psychosis type (HCC) -   Mood is stable, continue Quetiapine -   Followed by psych NP  5. Dementia with behavioral disturbance, unspecified dementia type (HCC) - Last BIMS score 3/15, ranging in severe cognitive deficit -Continue supportive care, fall precautions  6.  Colon cancer screening -No reported blood in stool -Stool occult blood x3   Family/ staff Communication:   Discussed plan of care with resident and charge nurse.  Labs/tests ordered: CBC and stool occult blood x3  Goals of care:   Short term care   Kenard Gower, DNP, FNP-BC [facility] [phone] (Monday-Friday 8:00 a.m. - 5:00 p.m.) [phone] (after hours)

## 2020-05-25 NOTE — Progress Notes (Signed)
This encounter was created in error - please disregard.

## 2020-05-26 DIAGNOSIS — I69398 Other sequelae of cerebral infarction: Secondary | ICD-10-CM | POA: Diagnosis not present

## 2020-05-26 DIAGNOSIS — R319 Hematuria, unspecified: Secondary | ICD-10-CM | POA: Diagnosis not present

## 2020-05-26 DIAGNOSIS — G9341 Metabolic encephalopathy: Secondary | ICD-10-CM | POA: Diagnosis not present

## 2020-05-26 DIAGNOSIS — F22 Delusional disorders: Secondary | ICD-10-CM | POA: Diagnosis not present

## 2020-05-26 DIAGNOSIS — D649 Anemia, unspecified: Secondary | ICD-10-CM | POA: Diagnosis not present

## 2020-05-26 DIAGNOSIS — M6281 Muscle weakness (generalized): Secondary | ICD-10-CM | POA: Diagnosis not present

## 2020-05-26 DIAGNOSIS — R1311 Dysphagia, oral phase: Secondary | ICD-10-CM | POA: Diagnosis not present

## 2020-05-26 LAB — CBC AND DIFFERENTIAL
HCT: 46 (ref 41–53)
Hemoglobin: 15.1 (ref 13.5–17.5)
Neutrophils Absolute: 3
Platelets: 322 (ref 150–399)
WBC: 7.3

## 2020-05-26 LAB — CBC: RBC: 4.76 (ref 3.87–5.11)

## 2020-05-27 DIAGNOSIS — R1311 Dysphagia, oral phase: Secondary | ICD-10-CM | POA: Diagnosis not present

## 2020-05-27 DIAGNOSIS — I69398 Other sequelae of cerebral infarction: Secondary | ICD-10-CM | POA: Diagnosis not present

## 2020-05-27 DIAGNOSIS — M6281 Muscle weakness (generalized): Secondary | ICD-10-CM | POA: Diagnosis not present

## 2020-05-27 DIAGNOSIS — G9341 Metabolic encephalopathy: Secondary | ICD-10-CM | POA: Diagnosis not present

## 2020-05-28 DIAGNOSIS — I69398 Other sequelae of cerebral infarction: Secondary | ICD-10-CM | POA: Diagnosis not present

## 2020-05-28 DIAGNOSIS — R1311 Dysphagia, oral phase: Secondary | ICD-10-CM | POA: Diagnosis not present

## 2020-05-28 DIAGNOSIS — G9341 Metabolic encephalopathy: Secondary | ICD-10-CM | POA: Diagnosis not present

## 2020-05-28 DIAGNOSIS — R4189 Other symptoms and signs involving cognitive functions and awareness: Secondary | ICD-10-CM | POA: Diagnosis not present

## 2020-05-28 DIAGNOSIS — M6281 Muscle weakness (generalized): Secondary | ICD-10-CM | POA: Diagnosis not present

## 2020-05-28 DIAGNOSIS — F4324 Adjustment disorder with disturbance of conduct: Secondary | ICD-10-CM | POA: Diagnosis not present

## 2020-05-29 DIAGNOSIS — G9341 Metabolic encephalopathy: Secondary | ICD-10-CM | POA: Diagnosis not present

## 2020-05-29 DIAGNOSIS — R1311 Dysphagia, oral phase: Secondary | ICD-10-CM | POA: Diagnosis not present

## 2020-05-29 DIAGNOSIS — I69398 Other sequelae of cerebral infarction: Secondary | ICD-10-CM | POA: Diagnosis not present

## 2020-05-29 DIAGNOSIS — M6281 Muscle weakness (generalized): Secondary | ICD-10-CM | POA: Diagnosis not present

## 2020-05-30 DIAGNOSIS — I69398 Other sequelae of cerebral infarction: Secondary | ICD-10-CM | POA: Diagnosis not present

## 2020-05-30 DIAGNOSIS — G9341 Metabolic encephalopathy: Secondary | ICD-10-CM | POA: Diagnosis not present

## 2020-05-30 DIAGNOSIS — M6281 Muscle weakness (generalized): Secondary | ICD-10-CM | POA: Diagnosis not present

## 2020-05-30 DIAGNOSIS — R1311 Dysphagia, oral phase: Secondary | ICD-10-CM | POA: Diagnosis not present

## 2020-06-02 DIAGNOSIS — M6281 Muscle weakness (generalized): Secondary | ICD-10-CM | POA: Diagnosis not present

## 2020-06-02 DIAGNOSIS — R1311 Dysphagia, oral phase: Secondary | ICD-10-CM | POA: Diagnosis not present

## 2020-06-02 DIAGNOSIS — I69398 Other sequelae of cerebral infarction: Secondary | ICD-10-CM | POA: Diagnosis not present

## 2020-06-02 DIAGNOSIS — G9341 Metabolic encephalopathy: Secondary | ICD-10-CM | POA: Diagnosis not present

## 2020-06-03 DIAGNOSIS — I69398 Other sequelae of cerebral infarction: Secondary | ICD-10-CM | POA: Diagnosis not present

## 2020-06-03 DIAGNOSIS — M6281 Muscle weakness (generalized): Secondary | ICD-10-CM | POA: Diagnosis not present

## 2020-06-03 DIAGNOSIS — G9341 Metabolic encephalopathy: Secondary | ICD-10-CM | POA: Diagnosis not present

## 2020-06-03 DIAGNOSIS — R1311 Dysphagia, oral phase: Secondary | ICD-10-CM | POA: Diagnosis not present

## 2020-06-04 DIAGNOSIS — R1311 Dysphagia, oral phase: Secondary | ICD-10-CM | POA: Diagnosis not present

## 2020-06-04 DIAGNOSIS — I69398 Other sequelae of cerebral infarction: Secondary | ICD-10-CM | POA: Diagnosis not present

## 2020-06-04 DIAGNOSIS — G9341 Metabolic encephalopathy: Secondary | ICD-10-CM | POA: Diagnosis not present

## 2020-06-04 DIAGNOSIS — M6281 Muscle weakness (generalized): Secondary | ICD-10-CM | POA: Diagnosis not present

## 2020-06-05 DIAGNOSIS — G9341 Metabolic encephalopathy: Secondary | ICD-10-CM | POA: Diagnosis not present

## 2020-06-05 DIAGNOSIS — M6281 Muscle weakness (generalized): Secondary | ICD-10-CM | POA: Diagnosis not present

## 2020-06-05 DIAGNOSIS — R1311 Dysphagia, oral phase: Secondary | ICD-10-CM | POA: Diagnosis not present

## 2020-06-05 DIAGNOSIS — I69398 Other sequelae of cerebral infarction: Secondary | ICD-10-CM | POA: Diagnosis not present

## 2020-06-06 DIAGNOSIS — G9341 Metabolic encephalopathy: Secondary | ICD-10-CM | POA: Diagnosis not present

## 2020-06-06 DIAGNOSIS — I69398 Other sequelae of cerebral infarction: Secondary | ICD-10-CM | POA: Diagnosis not present

## 2020-06-06 DIAGNOSIS — M6281 Muscle weakness (generalized): Secondary | ICD-10-CM | POA: Diagnosis not present

## 2020-06-06 DIAGNOSIS — R1311 Dysphagia, oral phase: Secondary | ICD-10-CM | POA: Diagnosis not present

## 2020-06-09 DIAGNOSIS — I69398 Other sequelae of cerebral infarction: Secondary | ICD-10-CM | POA: Diagnosis not present

## 2020-06-09 DIAGNOSIS — G9341 Metabolic encephalopathy: Secondary | ICD-10-CM | POA: Diagnosis not present

## 2020-06-09 DIAGNOSIS — R1311 Dysphagia, oral phase: Secondary | ICD-10-CM | POA: Diagnosis not present

## 2020-06-09 DIAGNOSIS — M6281 Muscle weakness (generalized): Secondary | ICD-10-CM | POA: Diagnosis not present

## 2020-06-10 DIAGNOSIS — M6281 Muscle weakness (generalized): Secondary | ICD-10-CM | POA: Diagnosis not present

## 2020-06-10 DIAGNOSIS — G9341 Metabolic encephalopathy: Secondary | ICD-10-CM | POA: Diagnosis not present

## 2020-06-10 DIAGNOSIS — I69398 Other sequelae of cerebral infarction: Secondary | ICD-10-CM | POA: Diagnosis not present

## 2020-06-10 DIAGNOSIS — R1311 Dysphagia, oral phase: Secondary | ICD-10-CM | POA: Diagnosis not present

## 2020-06-11 DIAGNOSIS — G9341 Metabolic encephalopathy: Secondary | ICD-10-CM | POA: Diagnosis not present

## 2020-06-11 DIAGNOSIS — M6281 Muscle weakness (generalized): Secondary | ICD-10-CM | POA: Diagnosis not present

## 2020-06-11 DIAGNOSIS — R1311 Dysphagia, oral phase: Secondary | ICD-10-CM | POA: Diagnosis not present

## 2020-06-11 DIAGNOSIS — I69398 Other sequelae of cerebral infarction: Secondary | ICD-10-CM | POA: Diagnosis not present

## 2020-06-12 DIAGNOSIS — G9341 Metabolic encephalopathy: Secondary | ICD-10-CM | POA: Diagnosis not present

## 2020-06-12 DIAGNOSIS — R1311 Dysphagia, oral phase: Secondary | ICD-10-CM | POA: Diagnosis not present

## 2020-06-12 DIAGNOSIS — M6281 Muscle weakness (generalized): Secondary | ICD-10-CM | POA: Diagnosis not present

## 2020-06-12 DIAGNOSIS — I69398 Other sequelae of cerebral infarction: Secondary | ICD-10-CM | POA: Diagnosis not present

## 2020-06-13 DIAGNOSIS — M6281 Muscle weakness (generalized): Secondary | ICD-10-CM | POA: Diagnosis not present

## 2020-06-13 DIAGNOSIS — I69398 Other sequelae of cerebral infarction: Secondary | ICD-10-CM | POA: Diagnosis not present

## 2020-06-13 DIAGNOSIS — G9341 Metabolic encephalopathy: Secondary | ICD-10-CM | POA: Diagnosis not present

## 2020-06-13 DIAGNOSIS — R1311 Dysphagia, oral phase: Secondary | ICD-10-CM | POA: Diagnosis not present

## 2020-06-16 DIAGNOSIS — G9341 Metabolic encephalopathy: Secondary | ICD-10-CM | POA: Diagnosis not present

## 2020-06-16 DIAGNOSIS — M6281 Muscle weakness (generalized): Secondary | ICD-10-CM | POA: Diagnosis not present

## 2020-06-16 DIAGNOSIS — I69398 Other sequelae of cerebral infarction: Secondary | ICD-10-CM | POA: Diagnosis not present

## 2020-06-17 ENCOUNTER — Non-Acute Institutional Stay (SKILLED_NURSING_FACILITY): Payer: Medicare Other | Admitting: Adult Health

## 2020-06-17 ENCOUNTER — Encounter: Payer: Self-pay | Admitting: Adult Health

## 2020-06-17 DIAGNOSIS — M6281 Muscle weakness (generalized): Secondary | ICD-10-CM | POA: Diagnosis not present

## 2020-06-17 DIAGNOSIS — F29 Unspecified psychosis not due to a substance or known physiological condition: Secondary | ICD-10-CM | POA: Diagnosis not present

## 2020-06-17 DIAGNOSIS — I69398 Other sequelae of cerebral infarction: Secondary | ICD-10-CM | POA: Diagnosis not present

## 2020-06-17 DIAGNOSIS — G9341 Metabolic encephalopathy: Secondary | ICD-10-CM | POA: Diagnosis not present

## 2020-06-17 DIAGNOSIS — Z8673 Personal history of transient ischemic attack (TIA), and cerebral infarction without residual deficits: Secondary | ICD-10-CM

## 2020-06-17 DIAGNOSIS — F0391 Unspecified dementia with behavioral disturbance: Secondary | ICD-10-CM

## 2020-06-17 DIAGNOSIS — I10 Essential (primary) hypertension: Secondary | ICD-10-CM

## 2020-06-17 NOTE — Progress Notes (Signed)
Location:  [facility] Room Number: 118-A Place of Service:  SNF (31) Provider:  Kenard Gower, DNP, FNP-BC  Patient Care Team: Pecola Lawless, MD as PCP - General (Internal Medicine)  Extended Emergency Contact Information Primary Emergency Contact: Pascal Lux of [place] Mobile Phone: [phone] Relation: Son Secondary Emergency Contact: Gillermina Hu Home Phone: [phone] Relation: Relative  Code Status:  FULL CODE  Goals of care: Advanced Directive information Advanced Directives 02/26/2020  Does Patient Have a Medical Advance Directive? No  Would patient like information on creating a medical advance directive? No - Patient declined     Chief Complaint  Patient presents with  . Medical Management of Chronic Issues    Routine [facility] visit    HPI:  Pt is a 74 y.o. male seen today for medical management of chronic diseases.  He is a long-term care resident of [facility].  He has a PMH of hypertension, GERD, cerebral infarction and COVID-19 pneumonia. He was seen in the room today. Resident had second COVID-19 vaccine yesterday. No adverse reaction reported. SBPs ranging from 121 to 152, with outlier  162. He denies headache. Currently, he takes Amlodipine for hypertension.   Past Medical History:  Diagnosis Date  . Acute respiratory failure with hypoxia (HCC)   . Cognitive communication deficit   . Constipation   . Dysphagia   . Essential hypertension   . GERD without esophagitis   . Gout   . History of COVID-19   . Metabolic encephalopathy   . Muscle weakness   . Other sequelae of cerebral infarction   . Pneumonia due to COVID-19 virus 2019   History reviewed. No pertinent surgical history.  Allergies  Allergen Reactions  . Atorvastatin Itching and Rash    Outpatient Encounter Medications as of 06/17/2020  Medication Sig  . acetaminophen (TYLENOL) 325 MG tablet Take 2  tablets (650 mg total) by mouth every 6 (six) hours as needed for mild pain or headache (fever >/= 101).  Marland Kitchen albuterol (VENTOLIN HFA) 108 (90 Base) MCG/ACT inhaler Inhale 2 puffs into the lungs every 4 (four) hours as needed for wheezing or shortness of breath.  Marland Kitchen amLODipine (NORVASC) 10 MG tablet Take 10 mg by mouth daily.  Marland Kitchen aspirin EC 81 MG tablet Take 1 tablet (81 mg total) by mouth daily.  . bisacodyl (DULCOLAX) 10 MG suppository Place 10 mg rectally daily as needed for moderate constipation.  . diclofenac Sodium (VOLTAREN) 1 % GEL Apply 2 g topically 4 (four) times daily.  . magnesium hydroxide (MILK OF MAGNESIA) 400 MG/5ML suspension Take 30 mLs by mouth daily as needed for mild constipation.  . Nutritional Supplement LIQD Take 120 mLs by mouth in the morning and at bedtime. [employer]  . ondansetron (ZOFRAN) 4 MG tablet Take 1 tablet (4 mg total) by mouth every 6 (six) hours as needed for nausea.  . polyethylene glycol (MIRALAX / GLYCOLAX) 17 g packet Take 17 g by mouth daily as needed for mild constipation.  . QUEtiapine (SEROQUEL) 25 MG tablet Take 12.5 mg by mouth 2 (two) times daily.  . SODIUM PHOSPHATES RE Place 1 each rectally daily as needed.   No facility-administered encounter medications on file as of 06/17/2020.    Review of Systems  GENERAL: No change in appetite, no fatigue, no weight changes, no fever, chills or weakness MOUTH and THROAT: Denies oral discomfort, gingival pain or bleeding RESPIRATORY: no cough, SOB, DOE, wheezing, hemoptysis CARDIAC:  No chest pain, edema or palpitations GI: No abdominal pain, diarrhea, constipation, heart burn, nausea or vomiting GU: Denies dysuria, frequency, hematuria, incontinence, or discharge NEUROLOGICAL: Denies dizziness, syncope, numbness, or headache PSYCHIATRIC: Denies feelings of depression or anxiety. No report of hallucinations, insomnia, paranoia, or agitation   Immunization History  Administered Date(s) Administered  .  Influenza, High Dose Seasonal PF 08/14/2014  . Moderna SARS-COVID-2 Vaccination 05/23/2020  . Pneumococcal Polysaccharide-23 03/04/2020   Pertinent  Health Maintenance Due  Topic Date Due  . COLONOSCOPY  Never done  . INFLUENZA VACCINE  06/15/2020  . PNA vac Low Risk Adult (2 of 2 - PCV13) 03/04/2021    Vitals:   06/17/20 1528 06/17/20 1533  BP: (!) 152/96 (!) 146/94  Pulse: 90   Resp: (!) 21   Temp: 97.8 F (36.6 C)   TempSrc: Oral   Weight: 166 lb 6.4 oz (75.5 kg)   Height: 5\' 8"  (1.727 m)    Body mass index is 25.3 kg/m.  Physical Exam  GENERAL APPEARANCE: Well nourished. In no acute distress. Normal body habitus SKIN:  Skin is warm and dry.  MOUTH and THROAT: Lips are without lesions. Oral mucosa is moist and without lesions. Tongue is normal in shape, size, and color and without lesions RESPIRATORY: Breathing is even & unlabored, BS CTAB CARDIAC: RRR, no murmur,no extra heart sounds, no edema GI: Abdomen soft, normal BS, no masses, no tenderness EXTREMITIES:  Able to move X 4 extremities  NEUROLOGICAL: There is no tremor. Speech is clear. Alert to self, disoriented to time and place. PSYCHIATRIC:  Affect and behavior are appropriate  Labs reviewed: Recent Labs    03/01/20 0827 03/01/20 0827 03/02/20 0453 03/04/20 0319 03/12/20 0000  NA 139   < > 140 139 145  K 3.1*   < > 3.9 3.6 4.3  CL 106   < > 108 109 108  CO2 21*   < > 20* 20* 21  GLUCOSE 102*  --  109* 111*  --   BUN 17   < > 17 21 16   CREATININE 1.19   < > 1.19 1.20 1.0  CALCIUM 8.3*   < > 9.0 8.7* 9.1   < > = values in this interval not displayed.   Recent Labs    02/29/20 0303 02/29/20 0303 03/01/20 0827 03/02/20 0453 03/12/20 0000  AST 25   < > 33 39 35  ALT 22   < > 28 38 64*  ALKPHOS 47   < > 54 67 100  BILITOT 0.5  --  0.8 1.3*  --   PROT 5.3*  --  5.7* 7.3  --   ALBUMIN 2.4*   < > 2.7* 3.2* 3.3*   < > = values in this interval not displayed.   Recent Labs    03/01/20 0827  03/01/20 0827 03/02/20 0453 03/02/20 0453 03/04/20 0319 03/12/20 0000 03/17/20 0000  WBC 8.0   < > 12.5*   < > 12.0* 12.6 11.3  NEUTROABS 5.3   < > 9.1*   < > 8.3* 9 8  HGB 16.5   < > 19.2*   < > 17.8* 16.9 15.5  HCT 47.5   < > 55.1*   < > 51.2 51 46  MCV 92.1  --  91.7  --  90.8  --   --   PLT 244   < > 330   < > 353 319 275   < > = values in this interval  not displayed.   Lab Results  Component Value Date   TSH 0.631 02/29/2020   Lab Results  Component Value Date   HGBA1C 5.6 02/26/2020    Assessment/Plan  1. Essential hypertension -Stable, continue amlodipine  2. History of CVA (cerebrovascular accident) -Stable, continue aspirin  3. Psychosis, unspecified psychosis type (HCC) -Stable, continue quetiapine -Followed by psych NP  4. Dementia with behavioral disturbance, unspecified dementia type (HCC) -  BIMS score 3/15, ranging in severe dementia -Continue supportive care      Family/ staff Communication:  Discussed plan of care with resident and charge nurse  Labs/tests ordered:  None  Goals of care:   Long-term care   Kenard Gower, DNP, MSN, FNP-BC [facility] [phone] (Monday-Friday 8:00 a.m. - 5:00 p.m.) [phone] (after hours)

## 2020-06-18 DIAGNOSIS — M6281 Muscle weakness (generalized): Secondary | ICD-10-CM | POA: Diagnosis not present

## 2020-06-18 DIAGNOSIS — I69398 Other sequelae of cerebral infarction: Secondary | ICD-10-CM | POA: Diagnosis not present

## 2020-06-18 DIAGNOSIS — G9341 Metabolic encephalopathy: Secondary | ICD-10-CM | POA: Diagnosis not present

## 2020-06-19 DIAGNOSIS — I69398 Other sequelae of cerebral infarction: Secondary | ICD-10-CM | POA: Diagnosis not present

## 2020-06-19 DIAGNOSIS — G9341 Metabolic encephalopathy: Secondary | ICD-10-CM | POA: Diagnosis not present

## 2020-06-19 DIAGNOSIS — M6281 Muscle weakness (generalized): Secondary | ICD-10-CM | POA: Diagnosis not present

## 2020-06-20 DIAGNOSIS — G9341 Metabolic encephalopathy: Secondary | ICD-10-CM | POA: Diagnosis not present

## 2020-06-20 DIAGNOSIS — I69398 Other sequelae of cerebral infarction: Secondary | ICD-10-CM | POA: Diagnosis not present

## 2020-06-20 DIAGNOSIS — M6281 Muscle weakness (generalized): Secondary | ICD-10-CM | POA: Diagnosis not present

## 2020-06-23 DIAGNOSIS — M6281 Muscle weakness (generalized): Secondary | ICD-10-CM | POA: Diagnosis not present

## 2020-06-23 DIAGNOSIS — I69398 Other sequelae of cerebral infarction: Secondary | ICD-10-CM | POA: Diagnosis not present

## 2020-06-23 DIAGNOSIS — G9341 Metabolic encephalopathy: Secondary | ICD-10-CM | POA: Diagnosis not present

## 2020-06-24 DIAGNOSIS — I69398 Other sequelae of cerebral infarction: Secondary | ICD-10-CM | POA: Diagnosis not present

## 2020-06-24 DIAGNOSIS — G9341 Metabolic encephalopathy: Secondary | ICD-10-CM | POA: Diagnosis not present

## 2020-06-24 DIAGNOSIS — M6281 Muscle weakness (generalized): Secondary | ICD-10-CM | POA: Diagnosis not present

## 2020-06-25 DIAGNOSIS — M6281 Muscle weakness (generalized): Secondary | ICD-10-CM | POA: Diagnosis not present

## 2020-06-25 DIAGNOSIS — I69398 Other sequelae of cerebral infarction: Secondary | ICD-10-CM | POA: Diagnosis not present

## 2020-06-25 DIAGNOSIS — G9341 Metabolic encephalopathy: Secondary | ICD-10-CM | POA: Diagnosis not present

## 2020-06-26 DIAGNOSIS — I69398 Other sequelae of cerebral infarction: Secondary | ICD-10-CM | POA: Diagnosis not present

## 2020-06-26 DIAGNOSIS — G9341 Metabolic encephalopathy: Secondary | ICD-10-CM | POA: Diagnosis not present

## 2020-06-26 DIAGNOSIS — M6281 Muscle weakness (generalized): Secondary | ICD-10-CM | POA: Diagnosis not present

## 2020-06-27 DIAGNOSIS — G9341 Metabolic encephalopathy: Secondary | ICD-10-CM | POA: Diagnosis not present

## 2020-06-27 DIAGNOSIS — M6281 Muscle weakness (generalized): Secondary | ICD-10-CM | POA: Diagnosis not present

## 2020-06-27 DIAGNOSIS — I69398 Other sequelae of cerebral infarction: Secondary | ICD-10-CM | POA: Diagnosis not present

## 2020-06-30 DIAGNOSIS — M6281 Muscle weakness (generalized): Secondary | ICD-10-CM | POA: Diagnosis not present

## 2020-06-30 DIAGNOSIS — I69398 Other sequelae of cerebral infarction: Secondary | ICD-10-CM | POA: Diagnosis not present

## 2020-06-30 DIAGNOSIS — G9341 Metabolic encephalopathy: Secondary | ICD-10-CM | POA: Diagnosis not present

## 2020-07-01 DIAGNOSIS — G9341 Metabolic encephalopathy: Secondary | ICD-10-CM | POA: Diagnosis not present

## 2020-07-01 DIAGNOSIS — I69398 Other sequelae of cerebral infarction: Secondary | ICD-10-CM | POA: Diagnosis not present

## 2020-07-01 DIAGNOSIS — M6281 Muscle weakness (generalized): Secondary | ICD-10-CM | POA: Diagnosis not present

## 2020-07-02 DIAGNOSIS — G9341 Metabolic encephalopathy: Secondary | ICD-10-CM | POA: Diagnosis not present

## 2020-07-02 DIAGNOSIS — I69398 Other sequelae of cerebral infarction: Secondary | ICD-10-CM | POA: Diagnosis not present

## 2020-07-02 DIAGNOSIS — M6281 Muscle weakness (generalized): Secondary | ICD-10-CM | POA: Diagnosis not present

## 2020-07-03 DIAGNOSIS — I69398 Other sequelae of cerebral infarction: Secondary | ICD-10-CM | POA: Diagnosis not present

## 2020-07-03 DIAGNOSIS — G9341 Metabolic encephalopathy: Secondary | ICD-10-CM | POA: Diagnosis not present

## 2020-07-03 DIAGNOSIS — M6281 Muscle weakness (generalized): Secondary | ICD-10-CM | POA: Diagnosis not present

## 2020-07-04 DIAGNOSIS — I69398 Other sequelae of cerebral infarction: Secondary | ICD-10-CM | POA: Diagnosis not present

## 2020-07-04 DIAGNOSIS — M6281 Muscle weakness (generalized): Secondary | ICD-10-CM | POA: Diagnosis not present

## 2020-07-04 DIAGNOSIS — G9341 Metabolic encephalopathy: Secondary | ICD-10-CM | POA: Diagnosis not present

## 2020-07-22 ENCOUNTER — Non-Acute Institutional Stay (SKILLED_NURSING_FACILITY): Payer: Medicare Other | Admitting: Internal Medicine

## 2020-07-22 ENCOUNTER — Encounter: Payer: Self-pay | Admitting: Internal Medicine

## 2020-07-22 DIAGNOSIS — I10 Essential (primary) hypertension: Secondary | ICD-10-CM | POA: Diagnosis not present

## 2020-07-22 DIAGNOSIS — F0390 Unspecified dementia without behavioral disturbance: Secondary | ICD-10-CM | POA: Diagnosis not present

## 2020-07-22 DIAGNOSIS — M1712 Unilateral primary osteoarthritis, left knee: Secondary | ICD-10-CM | POA: Diagnosis not present

## 2020-07-22 NOTE — Progress Notes (Signed)
° °  NURSING HOME LOCATION:  [facility] ROOM NUMBER:  118 A  CODE STATUS: Full code  PCP:  Douglass Rivers MD  This is a nursing facility follow up for specific acute issue of elevated BP.  Interim medical record and care since last [facility] visit was updated with review of diagnostic studies and change in clinical status since last visit were documented.  HPI: Staff reported blood pressure elevation to 170/100 today.  Other vital signs are excellent.  He is on amlodipine 10 mg daily. Other than the elevated blood pressure today of 170/100; blood pressures have ranged from 129/72 up to 157/96.  The majority of the systolics are less than 140.  He does have several diastolics in the 90s. Significant history includes COVID-19 pneumonia with acute  hypoxic failure in 2019.  He also has a history of cerebral infarction, metabolic encephalopathy, gout, GERD, and dysphagia. He has never smoked.  Staff reports that he is refusing his topical diclofenac gel.  Review of systems: Extensive review of systems with focus on cardiopulmonary symptoms was completed.  Unfortunately dementia invalidated responses.  He was unable to give me the date, not even the year ,nor the name of the president.  Constitutional: No fever, significant weight change, fatigue  Eyes: No redness, discharge, pain, vision change ENT/mouth: No nasal congestion,  purulent discharge, earache, change in hearing, sore throat  Cardiovascular: No chest pain, palpitations, paroxysmal nocturnal dyspnea, claudication, edema  Respiratory: No cough, sputum production, hemoptysis, DOE, significant snoring, apnea   Gastrointestinal: No heartburn, dysphagia, abdominal pain, nausea /vomiting, rectal bleeding, melena, change in bowels Genitourinary: No dysuria, hematuria, pyuria, incontinence, nocturia Musculoskeletal: No joint stiffness, joint swelling, weakness, pain Dermatologic: No rash, pruritus, change in appearance of  skin Neurologic: No dizziness, headache, syncope, seizures, numbness, tingling Psychiatric: No significant anxiety, depression, insomnia, anorexia Endocrine: No change in hair/skin/nails, excessive thirst, excessive hunger, excessive urination  Hematologic/lymphatic: No significant bruising, lymphadenopathy, abnormal bleeding Allergy/immunology: No itchy/watery eyes, significant sneezing, urticaria, angioedema  Physical exam:  Pertinent or positive findings: He is polite and interactive and his dementia is not readily apparent.  He is Transport planner.  Pedal pulses are decreased.  He has hyperpigmentation changes over the shins.  An antiwandering bracelet is present at the right ankle.  General appearance: Adequately nourished; no acute distress, increased work of breathing is present.   Lymphatic: No lymphadenopathy about the head, neck, axilla. Eyes: No conjunctival inflammation or lid edema is present. There is no scleral icterus. Ears:  External ear exam shows no significant lesions or deformities.   Nose:  External nasal examination shows no deformity or inflammation. Nasal mucosa are pink and moist without lesions, exudates Oral exam:  Lips and gums are healthy appearing. There is no oropharyngeal erythema or exudate. Neck:  No thyromegaly, masses, tenderness noted.    Heart:  Normal rate and regular rhythm. S1 and S2 normal without gallop, murmur, click, rub .  Lungs: Chest clear to auscultation without wheezes, rhonchi, rales, rubs. Abdomen: Bowel sounds are normal. Abdomen is soft and nontender with no organomegaly, hernias, masses. GU: Deferred  Extremities:  No cyanosis, clubbing, edema  Skin: Warm & dry w/o tenting. No significant lesions or rash.  See summary under each active problem in the Problem List with associated updated therapeutic plan

## 2020-07-22 NOTE — Assessment & Plan Note (Addendum)
07/22/2020 he is very polite and communicative.  Responses to extensive review of systems was totally negative; but his dementia invalidates the responses.  He was unable to give me the date, even the year and could not name the president.

## 2020-07-22 NOTE — Patient Instructions (Signed)
See assessment and plan under each diagnosis in the problem list and acutely for this visit 

## 2020-07-22 NOTE — Assessment & Plan Note (Signed)
He is refusing the topical diclofenac which will be discontinued.

## 2020-07-22 NOTE — Assessment & Plan Note (Addendum)
Review of blood pressure records indicates several diastolics in the 90s.  The systolic of 170 is an outlier. Low-dose metoprolol twice daily will be added with continued blood pressure monitor. He has no symptoms related to hypertension; unfortunately, his dementia invalidates his negative responses to review of systems questions.

## 2020-07-23 DIAGNOSIS — R4189 Other symptoms and signs involving cognitive functions and awareness: Secondary | ICD-10-CM | POA: Diagnosis not present

## 2020-07-23 DIAGNOSIS — F4324 Adjustment disorder with disturbance of conduct: Secondary | ICD-10-CM | POA: Diagnosis not present

## 2020-07-31 ENCOUNTER — Non-Acute Institutional Stay (SKILLED_NURSING_FACILITY): Payer: Medicare Other | Admitting: Adult Health

## 2020-07-31 ENCOUNTER — Encounter: Payer: Self-pay | Admitting: Adult Health

## 2020-07-31 DIAGNOSIS — F29 Unspecified psychosis not due to a substance or known physiological condition: Secondary | ICD-10-CM | POA: Diagnosis not present

## 2020-07-31 DIAGNOSIS — I10 Essential (primary) hypertension: Secondary | ICD-10-CM | POA: Diagnosis not present

## 2020-07-31 DIAGNOSIS — Z8673 Personal history of transient ischemic attack (TIA), and cerebral infarction without residual deficits: Secondary | ICD-10-CM | POA: Diagnosis not present

## 2020-07-31 DIAGNOSIS — F0391 Unspecified dementia with behavioral disturbance: Secondary | ICD-10-CM | POA: Diagnosis not present

## 2020-07-31 NOTE — Progress Notes (Signed)
Location:  [facility] Room Number: 118-A Place of Service:  SNF (31) Provider:  Kenard Gower, DNP, FNP-BC  Patient Care Team: Pecola Lawless, MD as PCP - General (Internal Medicine)  Extended Emergency Contact Information Primary Emergency Contact: Pascal Lux of [place] Mobile Phone: [phone] Relation: Son Secondary Emergency Contact: Gillermina Hu Home Phone: [phone] Relation: Relative  Code Status:  FULL CODE  Goals of care: Advanced Directive information Advanced Directives 02/26/2020  Does Patient Have a Medical Advance Directive? No  Would patient like information on creating a medical advance directive? No - Patient declined     Chief Complaint  Patient presents with  . Medical Management of Chronic Issues    Routine [facility] visit    HPI:  Pt is a 74 y.o. male seen today for medical management of chronic diseases.  He is a long-term care resident of [facility].  He has a PMH of hypertension, hyperlipidemia, GERD, cerebral infarction and COVID-19 pneumonia. He participates in restorative program, AROM by riding the NuStep on level 7 for 20-30 minutes. SBPs ranging from 119 to 144. He currently takes metoprolol tartrate 25 mg twice a day and amlodipine 10 mg daily for hypertension.  He takes aspirin EC 81 mg daily for his history of CVA. BIMS score 3/15, ranging in severe cognitive impairment.   Past Medical History:  Diagnosis Date  . Acute respiratory failure with hypoxia (HCC)   . Cognitive communication deficit   . Constipation   . Dysphagia   . Essential hypertension   . GERD without esophagitis   . Gout   . History of COVID-19   . Metabolic encephalopathy   . Muscle weakness   . Other sequelae of cerebral infarction   . Pneumonia due to COVID-19 virus 2019   History reviewed. No pertinent surgical history.  Allergies  Allergen Reactions  . Atorvastatin Itching  and Rash    Outpatient Encounter Medications as of 07/31/2020  Medication Sig  . acetaminophen (TYLENOL) 325 MG tablet Take 2 tablets (650 mg total) by mouth every 6 (six) hours as needed for mild pain or headache (fever >/= 101).  Marland Kitchen albuterol (VENTOLIN HFA) 108 (90 Base) MCG/ACT inhaler Inhale 2 puffs into the lungs every 4 (four) hours as needed for wheezing or shortness of breath.  Marland Kitchen amLODipine (NORVASC) 10 MG tablet Take 10 mg by mouth daily.  Marland Kitchen aspirin EC 81 MG tablet Take 1 tablet (81 mg total) by mouth daily.  . bisacodyl (DULCOLAX) 10 MG suppository Place 10 mg rectally daily as needed for moderate constipation.  . diclofenac Sodium (VOLTAREN) 1 % GEL Apply 2 g topically 4 (four) times daily.  . magnesium hydroxide (MILK OF MAGNESIA) 400 MG/5ML suspension Take 30 mLs by mouth daily as needed for mild constipation.  . metoprolol tartrate (LOPRESSOR) 25 MG tablet Take 25 mg by mouth 2 (two) times daily.  . Nutritional Supplement LIQD Take 120 mLs by mouth in the morning and at bedtime. MedPass  . ondansetron (ZOFRAN) 4 MG tablet Take 1 tablet (4 mg total) by mouth every 6 (six) hours as needed for nausea.  . polyethylene glycol (MIRALAX / GLYCOLAX) 17 g packet Take 17 g by mouth daily as needed for mild constipation.  . QUEtiapine (SEROQUEL) 25 MG tablet Take 12.5 mg by mouth 2 (two) times daily.  . SODIUM PHOSPHATES RE Place 1 each rectally daily as needed.   No facility-administered encounter medications on file as  of 07/31/2020.    Review of Systems  GENERAL: No change in appetite, no fatigue, no weight changes, no fever, chills or weakness MOUTH and THROAT: Denies oral discomfort, gingival pain or bleeding RESPIRATORY: no cough, SOB, DOE, wheezing, hemoptysis CARDIAC: No chest pain, edema or palpitations GI: No abdominal pain, diarrhea, constipation, heart burn, nausea or vomiting GU: Denies dysuria, frequency, hematuria or discharge NEUROLOGICAL: Denies dizziness, syncope,  numbness, or headache PSYCHIATRIC: Denies feelings of depression or anxiety. No report of hallucinations, insomnia, paranoia, or agitation   Immunization History  Administered Date(s) Administered  . Influenza, High Dose Seasonal PF 08/14/2014  . Moderna SARS-COVID-2 Vaccination 05/20/2020, 06/16/2020  . Pneumococcal Polysaccharide-23 03/04/2020   Pertinent  Health Maintenance Due  Topic Date Due  . COLONOSCOPY  Never done  . INFLUENZA VACCINE  02/12/2021 (Originally 06/15/2020)  . PNA vac Low Risk Adult (2 of 2 - PCV13) 03/04/2021    Vitals:   07/31/20 1248  BP: 135/72  Pulse: 83  Resp: 19  Temp: (!) 97.5 F (36.4 C)  TempSrc: Oral  Weight: 167 lb 9.6 oz (76 kg)  Height: 5\' 8"  (1.727 m)   Body mass index is 25.48 kg/m.  Physical Exam  GENERAL APPEARANCE: Well nourished. In no acute distress. Normal body habitus SKIN:  Skin is warm and dry.  MOUTH and THROAT: Lips are without lesions. Oral mucosa is moist and without lesions.  RESPIRATORY: Breathing is even & unlabored, BS CTAB CARDIAC: RRR, no murmur,no extra heart sounds, no edema GI: Abdomen soft, normal BS, no masses, no tenderness EXTREMITIES:  Able to move X 4 extremities NEUROLOGICAL: There is no tremor. Speech is clear. Alert to self, disoriented to time and place. PSYCHIATRIC:  Affect and behavior are appropriate  Labs reviewed: Recent Labs    03/01/20 0827 03/01/20 0827 03/02/20 0453 03/04/20 0319 03/12/20 0000  NA 139   < > 140 139 145  K 3.1*   < > 3.9 3.6 4.3  CL 106   < > 108 109 108  CO2 21*   < > 20* 20* 21  GLUCOSE 102*  --  109* 111*  --   BUN 17   < > 17 21 16   CREATININE 1.19   < > 1.19 1.20 1.0  CALCIUM 8.3*   < > 9.0 8.7* 9.1   < > = values in this interval not displayed.   Recent Labs    02/29/20 0303 02/29/20 0303 03/01/20 0827 03/02/20 0453 03/12/20 0000  AST 25   < > 33 39 35  ALT 22   < > 28 38 64*  ALKPHOS 47   < > 54 67 100  BILITOT 0.5  --  0.8 1.3*  --   PROT 5.3*   --  5.7* 7.3  --   ALBUMIN 2.4*   < > 2.7* 3.2* 3.3*   < > = values in this interval not displayed.   Recent Labs    03/01/20 0827 03/01/20 0827 03/02/20 0453 03/02/20 0453 03/04/20 0319 03/12/20 0000 03/17/20 0000 04/09/20 0000 05/26/20 0000  WBC 8.0   < > 12.5*   < > 12.0*   < > 11.3 7.3 7.3  NEUTROABS 5.3   < > 9.1*   < > 8.3*   < > 8 3 3   HGB 16.5   < > 19.2*   < > 17.8*   < > 15.5 15.1 15.1  HCT 47.5   < > 55.1*   < > 51.2   < >  46 46 46  MCV 92.1  --  91.7  --  90.8  --   --   --   --   PLT 244   < > 330   < > 353   < > 275 322 322   < > = values in this interval not displayed.   Lab Results  Component Value Date   TSH 0.631 02/29/2020   Lab Results  Component Value Date   HGBA1C 5.6 02/26/2020   Assessment/Plan  1. History of CVA (cerebrovascular accident) -  Stable, continue aspirin  2. Essential hypertension -Stable, continue metoprolol tartrate and amlodipine  3. Psychosis, unspecified psychosis type (HCC) -No recent reported agitation, continue quetiapine -Followed up by psych NP  4. Dementia with behavioral disturbance, unspecified dementia type (HCC) - BIMS score 3/15, ranging in severe cognitive impairment -Continue supportive care    Family/ staff Communication: Discussed plan of care with resident and charge nurse.  Labs/tests ordered: None  Goals of care:   Long-term care   Kenard Gower, DNP, MSN, FNP-BC [facility] and Adult Medicine [phone] (Monday-Friday 8:00 a.m. - 5:00 p.m.) [phone] (after hours)

## 2020-08-18 DIAGNOSIS — I69398 Other sequelae of cerebral infarction: Secondary | ICD-10-CM | POA: Diagnosis not present

## 2020-08-18 DIAGNOSIS — R488 Other symbolic dysfunctions: Secondary | ICD-10-CM | POA: Diagnosis not present

## 2020-08-18 DIAGNOSIS — I69315 Cognitive social or emotional deficit following cerebral infarction: Secondary | ICD-10-CM | POA: Diagnosis not present

## 2020-08-19 DIAGNOSIS — I69315 Cognitive social or emotional deficit following cerebral infarction: Secondary | ICD-10-CM | POA: Diagnosis not present

## 2020-08-19 DIAGNOSIS — R488 Other symbolic dysfunctions: Secondary | ICD-10-CM | POA: Diagnosis not present

## 2020-08-19 DIAGNOSIS — I69398 Other sequelae of cerebral infarction: Secondary | ICD-10-CM | POA: Diagnosis not present

## 2020-08-20 ENCOUNTER — Encounter: Payer: Self-pay | Admitting: Adult Health

## 2020-08-20 ENCOUNTER — Non-Acute Institutional Stay (SKILLED_NURSING_FACILITY): Payer: Medicare Other | Admitting: Adult Health

## 2020-08-20 DIAGNOSIS — I69315 Cognitive social or emotional deficit following cerebral infarction: Secondary | ICD-10-CM | POA: Diagnosis not present

## 2020-08-20 DIAGNOSIS — Z8673 Personal history of transient ischemic attack (TIA), and cerebral infarction without residual deficits: Secondary | ICD-10-CM | POA: Diagnosis not present

## 2020-08-20 DIAGNOSIS — F29 Unspecified psychosis not due to a substance or known physiological condition: Secondary | ICD-10-CM | POA: Diagnosis not present

## 2020-08-20 DIAGNOSIS — R488 Other symbolic dysfunctions: Secondary | ICD-10-CM | POA: Diagnosis not present

## 2020-08-20 DIAGNOSIS — I10 Essential (primary) hypertension: Secondary | ICD-10-CM

## 2020-08-20 DIAGNOSIS — F0391 Unspecified dementia with behavioral disturbance: Secondary | ICD-10-CM | POA: Diagnosis not present

## 2020-08-20 DIAGNOSIS — Z9114 Patient's other noncompliance with medication regimen: Secondary | ICD-10-CM | POA: Diagnosis not present

## 2020-08-20 DIAGNOSIS — I69398 Other sequelae of cerebral infarction: Secondary | ICD-10-CM | POA: Diagnosis not present

## 2020-08-20 DIAGNOSIS — F4324 Adjustment disorder with disturbance of conduct: Secondary | ICD-10-CM | POA: Diagnosis not present

## 2020-08-20 NOTE — Progress Notes (Signed)
Location:  [facility] Room Number: 118-A Place of Service:  SNF (31) Provider:  Kenard Gower, DNP, FNP-BC  Patient Care Team: Pecola Lawless, MD as PCP - General (Internal Medicine)  Extended Emergency Contact Information Primary Emergency Contact: Pascal Lux of [place] Mobile Phone: [phone] Relation: Son Secondary Emergency Contact: Gillermina Hu Home Phone: [phone] Relation: Relative  Code Status:  FULL CODE  Goals of care: Advanced Directive information Advanced Directives 02/26/2020  Does Patient Have a Medical Advance Directive? No  Would patient like information on creating a medical advance directive? No - Patient declined     Chief Complaint  Patient presents with  . Medical Management of Chronic Issues    Routine [facility] visit    HPI:  Vincent Cummings is a 74 y.o. male seen seen today for medical management of chronic diseases.  He is a long-term care resident of [facility].  He has a PMH of hypertension, hyperlipidemia, GERD, cerebral infarction and COVID-19 pneumonia.  SBPs ranging from 118-166.  He is currently taking metoprolol tartrate 25 mg twice a day and amlodipine 10 mg 1 tablet daily for hypertension. Staff reported that he has occasional refusal of his medications and restorative exercises. He does restorative AROM by riding the NuStep level 7 for 2--30 minutes. He was seen by psych NP and was started on PRN Ativan gel.   Past Medical History:  Diagnosis Date  . Acute respiratory failure with hypoxia (HCC)   . Cognitive communication deficit   . Constipation   . Dysphagia   . Essential hypertension   . GERD without esophagitis   . Gout   . History of COVID-19   . Metabolic encephalopathy   . Muscle weakness   . Other sequelae of cerebral infarction   . Pneumonia due to COVID-19 virus 2019   History reviewed. No pertinent surgical history.  Allergies  Allergen  Reactions  . Atorvastatin Itching and Rash    Outpatient Encounter Medications as of 08/20/2020  Medication Sig  . acetaminophen (TYLENOL) 325 MG tablet Take 2 tablets (650 mg total) by mouth every 6 (six) hours as needed for mild pain or headache (fever >/= 101).  Marland Kitchen albuterol (VENTOLIN HFA) 108 (90 Base) MCG/ACT inhaler Inhale 2 puffs into the lungs every 4 (four) hours as needed for wheezing or shortness of breath.  Marland Kitchen amLODipine (NORVASC) 10 MG tablet Take 10 mg by mouth daily.  Marland Kitchen aspirin EC 81 MG tablet Take 1 tablet (81 mg total) by mouth daily.  . bisacodyl (DULCOLAX) 10 MG suppository Place 10 mg rectally daily as needed for moderate constipation.  . magnesium hydroxide (MILK OF MAGNESIA) 400 MG/5ML suspension Take 30 mLs by mouth daily as needed for mild constipation.  . metoprolol tartrate (LOPRESSOR) 25 MG tablet Take 25 mg by mouth 2 (two) times daily.  . Nutritional Supplement LIQD Take 120 mLs by mouth in the morning and at bedtime. MedPass  . ondansetron (ZOFRAN) 4 MG tablet Take 1 tablet (4 mg total) by mouth every 6 (six) hours as needed for nausea.  . polyethylene glycol (MIRALAX / GLYCOLAX) 17 g packet Take 17 g by mouth daily as needed for mild constipation.  . QUEtiapine (SEROQUEL) 25 MG tablet Take 12.5 mg by mouth 2 (two) times daily.  . SODIUM PHOSPHATES RE Place 1 each rectally daily as needed.  . [DISCONTINUED] diclofenac Sodium (VOLTAREN) 1 % GEL Apply 2 g topically 4 (four) times daily.  No facility-administered encounter medications on file as of 08/20/2020.    Review of Systems  GENERAL: No change in appetite, no fatigue, no weight changes, no fever, chills or weakness MOUTH and THROAT: Denies oral discomfort, gingival pain or bleeding RESPIRATORY: no cough, SOB, DOE, wheezing, hemoptysis CARDIAC: No chest pain, edema or palpitations GI: No abdominal pain, diarrhea, constipation, heart burn, nausea or vomiting GU: Denies dysuria, frequency, hematuria,  incontinence, or discharge NEUROLOGICAL: Denies dizziness, syncope, numbness, or headache PSYCHIATRIC: +agitation    Immunization History  Administered Date(s) Administered  . Influenza, High Dose Seasonal PF 08/14/2014  . Moderna SARS-COVID-2 Vaccination 05/20/2020, 06/16/2020  . Pneumococcal Polysaccharide-23 03/04/2020   Pertinent  Health Maintenance Due  Topic Date Due  . COLONOSCOPY  Never done  . INFLUENZA VACCINE  02/12/2021 (Originally 06/15/2020)  . PNA vac Low Risk Adult (2 of 2 - PCV13) 03/04/2021    Vitals:   08/20/20 1437  BP: (!) 143/94  Pulse: 74  Resp: 16  Temp: 98.8 F (37.1 C)  TempSrc: Oral  Weight: 167 lb 9.6 oz (76 kg)  Height: 5\' 8"  (1.727 m)   Body mass index is 25.48 kg/m.  Physical Exam  GENERAL APPEARANCE: Well nourished. In no acute distress. Normal body habitus SKIN:  Skin is warm and dry.  MOUTH and THROAT: Lips are without lesions. Oral mucosa is moist and without lesions. Tongue is normal in shape, size, and color and without lesions RESPIRATORY: Breathing is even & unlabored, BS CTAB CARDIAC: RRR, no murmur,no extra heart sounds, no edema GI: Abdomen soft, normal BS, no masses, no tenderness EXTREMITIES:  Able to  Move X 4 extremities NEUROLOGICAL: There is no tremor. Speech is clear. Alert to self, disoriented to time and place. PSYCHIATRIC:  Affect and behavior are appropriate  Labs reviewed: Recent Labs    03/01/20 0827 03/01/20 0827 03/02/20 0453 03/04/20 0319 03/12/20 0000  NA 139   < > 140 139 145  K 3.1*   < > 3.9 3.6 4.3  CL 106   < > 108 109 108  CO2 21*   < > 20* 20* 21  GLUCOSE 102*  --  109* 111*  --   BUN 17   < > 17 21 16   CREATININE 1.19   < > 1.19 1.20 1.0  CALCIUM 8.3*   < > 9.0 8.7* 9.1   < > = values in this interval not displayed.   Recent Labs    02/29/20 0303 02/29/20 0303 03/01/20 0827 03/02/20 0453 03/12/20 0000  AST 25   < > 33 39 35  ALT 22   < > 28 38 64*  ALKPHOS 47   < > 54 67 100    BILITOT 0.5  --  0.8 1.3*  --   PROT 5.3*  --  5.7* 7.3  --   ALBUMIN 2.4*   < > 2.7* 3.2* 3.3*   < > = values in this interval not displayed.   Recent Labs    03/01/20 0827 03/01/20 0827 03/02/20 0453 03/02/20 0453 03/04/20 0319 03/12/20 0000 03/17/20 0000 04/09/20 0000 05/26/20 0000  WBC 8.0   < > 12.5*   < > 12.0*   < > 11.3 7.3 7.3  NEUTROABS 5.3   < > 9.1*   < > 8.3*   < > 8 3 3   HGB 16.5   < > 19.2*   < > 17.8*   < > 15.5 15.1 15.1  HCT 47.5   < >  55.1*   < > 51.2   < > 46 46 46  MCV 92.1  --  91.7  --  90.8  --   --   --   --   PLT 244   < > 330   < > 353   < > 275 322 322   < > = values in this interval not displayed.   Lab Results  Component Value Date   TSH 0.631 02/29/2020   Lab Results  Component Value Date   HGBA1C 5.6 02/26/2020    Assessment/Plan  1. Essential hypertension -  Elevated BPs, will start Lotensin 5 mg 1 tab daily -Continue metoprolol tartrate and amlodipine -Monitor BPs  2. History of CVA (cerebrovascular accident) -Stable, continue aspirin  3. Psychosis, unspecified psychosis type (HCC) -Was started on Ativan gel by psych NP -Continue Quetiapine  4. Dementia with behavioral disturbance, unspecified dementia type (HCC) -Continue supportive care and fall precautions     Family/ staff Communication:   Discussed plan of care with resident and charge nurse.  Labs/tests ordered: None  Goals of care:   Long-term care  Kenard Gower, DNP, MSN, FNP-BC [facility] and Adult Medicine [phone] (Monday-Friday 8:00 a.m. - 5:00 p.m.) [phone] (after hours)

## 2020-08-21 DIAGNOSIS — I69398 Other sequelae of cerebral infarction: Secondary | ICD-10-CM | POA: Diagnosis not present

## 2020-08-21 DIAGNOSIS — R488 Other symbolic dysfunctions: Secondary | ICD-10-CM | POA: Diagnosis not present

## 2020-08-21 DIAGNOSIS — I69315 Cognitive social or emotional deficit following cerebral infarction: Secondary | ICD-10-CM | POA: Diagnosis not present

## 2020-08-22 DIAGNOSIS — I69315 Cognitive social or emotional deficit following cerebral infarction: Secondary | ICD-10-CM | POA: Diagnosis not present

## 2020-08-22 DIAGNOSIS — R488 Other symbolic dysfunctions: Secondary | ICD-10-CM | POA: Diagnosis not present

## 2020-08-22 DIAGNOSIS — I69398 Other sequelae of cerebral infarction: Secondary | ICD-10-CM | POA: Diagnosis not present

## 2020-08-25 DIAGNOSIS — R488 Other symbolic dysfunctions: Secondary | ICD-10-CM | POA: Diagnosis not present

## 2020-08-25 DIAGNOSIS — I69398 Other sequelae of cerebral infarction: Secondary | ICD-10-CM | POA: Diagnosis not present

## 2020-08-25 DIAGNOSIS — I69315 Cognitive social or emotional deficit following cerebral infarction: Secondary | ICD-10-CM | POA: Diagnosis not present

## 2020-08-26 DIAGNOSIS — I69398 Other sequelae of cerebral infarction: Secondary | ICD-10-CM | POA: Diagnosis not present

## 2020-08-26 DIAGNOSIS — I69315 Cognitive social or emotional deficit following cerebral infarction: Secondary | ICD-10-CM | POA: Diagnosis not present

## 2020-08-26 DIAGNOSIS — R488 Other symbolic dysfunctions: Secondary | ICD-10-CM | POA: Diagnosis not present

## 2020-08-27 ENCOUNTER — Encounter: Payer: Self-pay | Admitting: Adult Health

## 2020-08-27 ENCOUNTER — Non-Acute Institutional Stay (SKILLED_NURSING_FACILITY): Payer: Medicare Other | Admitting: Adult Health

## 2020-08-27 DIAGNOSIS — I10 Essential (primary) hypertension: Secondary | ICD-10-CM | POA: Diagnosis not present

## 2020-08-27 DIAGNOSIS — R488 Other symbolic dysfunctions: Secondary | ICD-10-CM | POA: Diagnosis not present

## 2020-08-27 DIAGNOSIS — F0390 Unspecified dementia without behavioral disturbance: Secondary | ICD-10-CM | POA: Diagnosis not present

## 2020-08-27 DIAGNOSIS — F29 Unspecified psychosis not due to a substance or known physiological condition: Secondary | ICD-10-CM | POA: Diagnosis not present

## 2020-08-27 DIAGNOSIS — Z7189 Other specified counseling: Secondary | ICD-10-CM | POA: Diagnosis not present

## 2020-08-27 DIAGNOSIS — Z8673 Personal history of transient ischemic attack (TIA), and cerebral infarction without residual deficits: Secondary | ICD-10-CM

## 2020-08-27 DIAGNOSIS — I69315 Cognitive social or emotional deficit following cerebral infarction: Secondary | ICD-10-CM | POA: Diagnosis not present

## 2020-08-27 DIAGNOSIS — I69398 Other sequelae of cerebral infarction: Secondary | ICD-10-CM | POA: Diagnosis not present

## 2020-08-27 NOTE — Progress Notes (Signed)
Location:  [facility] Room Number: 118-A Place of Service:  SNF (31) Provider:  Kenard Gower, DNP, FNP-BC  Patient Care Team: Pecola Lawless, MD as PCP - General (Internal Medicine)  Extended Emergency Contact Information Primary Emergency Contact: Pascal Lux of [place] Mobile Phone: [phone] Relation: Son Secondary Emergency Contact: Gillermina Hu Home Phone: [phone] Relation: Relative  Code Status:  FULL CODE  Goals of care: Advanced Directive information Advanced Directives 02/26/2020  Does Patient Have a Medical Advance Directive? No  Would patient like information on creating a medical advance directive? No - Patient declined     Chief Complaint  Patient presents with  . Advanced Directive    Patient is seen for a Care Plan Meeting    HPI:  Pt is a 74 y.o. male seen today for a care plan meeting. He is a long-term care resident of [facility]. He has a PMH of hypertension, hyperlipidemia, GERD, cerebral infarction and COVID-19 pneumonia. The meeting was attended by resident,  NP, MDS coordinator, Child psychotherapist, dietitian and Psychologist, counselling. Responsible party was called via teleconference but did not answer. He remains to be full code. Discussed medications, vital signs and weights.. He was recently started on ABH gel 0.5 mg/12.5 mg/0.5 mg/ml every 6 hours PRN for severe psychosis/anxiety. He has been refusing his medications and says,"I'm OK. I don't need medications." He currently participates in restorative AROM by riding the Nu-step on level 7 for 10-15 minutes. The meeting lasted for 25 minutes.   Past Medical History:  Diagnosis Date  . Acute respiratory failure with hypoxia (HCC)   . Cognitive communication deficit   . Constipation   . Dysphagia   . Essential hypertension   . GERD without esophagitis   . Gout   . History of COVID-19   . Metabolic encephalopathy   .  Muscle weakness   . Other sequelae of cerebral infarction   . Pneumonia due to COVID-19 virus 2019   No past surgical history on file.  Allergies  Allergen Reactions  . Atorvastatin Itching and Rash    Outpatient Encounter Medications as of 08/27/2020  Medication Sig  . acetaminophen (TYLENOL) 325 MG tablet Take 2 tablets (650 mg total) by mouth every 6 (six) hours as needed for mild pain or headache (fever >/= 101).  Marland Kitchen albuterol (VENTOLIN HFA) 108 (90 Base) MCG/ACT inhaler Inhale 2 puffs into the lungs every 4 (four) hours as needed for wheezing or shortness of breath.  Marland Kitchen amLODipine (NORVASC) 10 MG tablet Take 10 mg by mouth daily.  Marland Kitchen aspirin EC 81 MG tablet Take 1 tablet (81 mg total) by mouth daily.  . bisacodyl (DULCOLAX) 10 MG suppository Place 10 mg rectally daily as needed for moderate constipation.  . magnesium hydroxide (MILK OF MAGNESIA) 400 MG/5ML suspension Take 30 mLs by mouth daily as needed for mild constipation.  . metoprolol tartrate (LOPRESSOR) 25 MG tablet Take 25 mg by mouth 2 (two) times daily.  . Nutritional Supplement LIQD Take 120 mLs by mouth in the morning and at bedtime. MedPass  . ondansetron (ZOFRAN) 4 MG tablet Take 1 tablet (4 mg total) by mouth every 6 (six) hours as needed for nausea.  . polyethylene glycol (MIRALAX / GLYCOLAX) 17 g packet Take 17 g by mouth daily as needed for mild constipation.  . QUEtiapine (SEROQUEL) 25 MG tablet Take 12.5 mg by mouth 2 (two) times daily.  . SODIUM PHOSPHATES RE Place 1 each  rectally daily as needed.   No facility-administered encounter medications on file as of 08/27/2020.    Review of Systems  GENERAL: No change in appetite, no fatigue, no weight changes, no fever, chills or weakness MOUTH and THROAT: Denies oral discomfort, gingival pain or bleeding RESPIRATORY: no cough, SOB, DOE, wheezing, hemoptysis CARDIAC: No chest pain, edema or palpitations GI: No abdominal pain, diarrhea, constipation, heart burn,  nausea or vomiting GU: Denies dysuria, frequency, hematuria, incontinence, or discharge NEUROLOGICAL: Denies dizziness, syncope, numbness, or headache PSYCHIATRIC: Denies feelings of depression or anxiety. +Agitation  Immunization History  Administered Date(s) Administered  . Influenza, High Dose Seasonal PF 08/14/2014  . Moderna SARS-COVID-2 Vaccination 05/20/2020, 06/16/2020  . Pneumococcal Polysaccharide-23 03/04/2020   Pertinent  Health Maintenance Due  Topic Date Due  . COLONOSCOPY  Never done  . INFLUENZA VACCINE  02/12/2021 (Originally 06/15/2020)  . PNA vac Low Risk Adult (2 of 2 - PCV13) 03/04/2021    Vitals:   08/27/20 1415 08/27/20 1416  BP: (!) 168/99 (!) 163/98  Pulse: 69   Resp: 19   Temp: (!) 97.5 F (36.4 C)   TempSrc: Oral   SpO2: 93%   Weight: 169 lb 6.4 oz (76.8 kg)   Height: 5\' 8"  (1.727 m)    Body mass index is 25.76 kg/m.  Physical Exam  GENERAL APPEARANCE: Well nourished. In no acute distress. Normal body habitus SKIN:  Skin is warm and dry.  MOUTH and THROAT: Lips are without lesions. Oral mucosa is moist and without lesions. Tongue is normal in shape, size, and color and without lesions RESPIRATORY: Breathing is even & unlabored, BS CTAB CARDIAC: RRR, no murmur,no extra heart sounds, no edema GI: Abdomen soft, normal BS, no masses, no tenderness EXTREMITIES:  Able to move X 4 extremities. NEUROLOGICAL: There is no tremor. Speech is clear. Alert to self, disoriented to time and place. PSYCHIATRIC:  Affect appropriate  Labs reviewed: Recent Labs    03/01/20 0827 03/01/20 0827 03/02/20 0453 03/04/20 0319 03/12/20 0000  NA 139   < > 140 139 145  K 3.1*   < > 3.9 3.6 4.3  CL 106   < > 108 109 108  CO2 21*   < > 20* 20* 21  GLUCOSE 102*  --  109* 111*  --   BUN 17   < > 17 21 16   CREATININE 1.19   < > 1.19 1.20 1.0  CALCIUM 8.3*   < > 9.0 8.7* 9.1   < > = values in this interval not displayed.   Recent Labs    02/29/20 0303  02/29/20 0303 03/01/20 0827 03/02/20 0453 03/12/20 0000  AST 25   < > 33 39 35  ALT 22   < > 28 38 64*  ALKPHOS 47   < > 54 67 100  BILITOT 0.5  --  0.8 1.3*  --   PROT 5.3*  --  5.7* 7.3  --   ALBUMIN 2.4*   < > 2.7* 3.2* 3.3*   < > = values in this interval not displayed.   Recent Labs    03/01/20 0827 03/01/20 0827 03/02/20 0453 03/02/20 0453 03/04/20 0319 03/12/20 0000 03/17/20 0000 04/09/20 0000 05/26/20 0000  WBC 8.0   < > 12.5*   < > 12.0*   < > 11.3 7.3 7.3  NEUTROABS 5.3   < > 9.1*   < > 8.3*   < > 8 3 3   HGB 16.5   < >  19.2*   < > 17.8*   < > 15.5 15.1 15.1  HCT 47.5   < > 55.1*   < > 51.2   < > 46 46 46  MCV 92.1  --  91.7  --  90.8  --   --   --   --   PLT 244   < > 330   < > 353   < > 275 322 322   < > = values in this interval not displayed.   Lab Results  Component Value Date   TSH 0.631 02/29/2020   Lab Results  Component Value Date   HGBA1C 5.6 02/26/2020    Assessment/Plan  1. ACP (advance care planning) -  Remains to be full code -  Discussed medications, vital signs and weights  2. History of CVA (cerebrovascular accident) - stable, continue ASA  3. Essential hypertension - SBPs ranging from 142 to 168, has been refusing medications -  Will monitor BP  4. Psychosis, unspecified psychosis type (HCC) -  Recently started on PRN ABH gel for severe psychosis and agitation -  Continue Seroquel -  Followed by psych NP  5. Dementia without behavioral disturbance, unspecified dementia type [facility]) -continue supportive care -  Fall precautions   Family/ staff Communication:  Discussed plan of care with resident and IDT.  Labs/tests ordered:  None  Goals of care:   Long-term care    Kenard Gower, DNP, MSN, FNP-BC [facility] and Adult Medicine [phone] (Monday-Friday 8:00 a.m. - 5:00 p.m.) [phone] (after hours)

## 2020-08-28 DIAGNOSIS — R488 Other symbolic dysfunctions: Secondary | ICD-10-CM | POA: Diagnosis not present

## 2020-08-28 DIAGNOSIS — I69398 Other sequelae of cerebral infarction: Secondary | ICD-10-CM | POA: Diagnosis not present

## 2020-08-28 DIAGNOSIS — I69315 Cognitive social or emotional deficit following cerebral infarction: Secondary | ICD-10-CM | POA: Diagnosis not present

## 2020-08-29 DIAGNOSIS — I69315 Cognitive social or emotional deficit following cerebral infarction: Secondary | ICD-10-CM | POA: Diagnosis not present

## 2020-08-29 DIAGNOSIS — R488 Other symbolic dysfunctions: Secondary | ICD-10-CM | POA: Diagnosis not present

## 2020-08-29 DIAGNOSIS — I69398 Other sequelae of cerebral infarction: Secondary | ICD-10-CM | POA: Diagnosis not present

## 2020-09-03 DIAGNOSIS — Z9114 Patient's other noncompliance with medication regimen: Secondary | ICD-10-CM | POA: Diagnosis not present

## 2020-09-03 DIAGNOSIS — F4324 Adjustment disorder with disturbance of conduct: Secondary | ICD-10-CM | POA: Diagnosis not present

## 2020-09-03 DIAGNOSIS — F29 Unspecified psychosis not due to a substance or known physiological condition: Secondary | ICD-10-CM | POA: Diagnosis not present

## 2020-09-08 ENCOUNTER — Non-Acute Institutional Stay (SKILLED_NURSING_FACILITY): Payer: Medicare Other | Admitting: Adult Health

## 2020-09-08 ENCOUNTER — Encounter: Payer: Self-pay | Admitting: Internal Medicine

## 2020-09-08 DIAGNOSIS — I10 Essential (primary) hypertension: Secondary | ICD-10-CM | POA: Diagnosis not present

## 2020-09-08 DIAGNOSIS — Z8673 Personal history of transient ischemic attack (TIA), and cerebral infarction without residual deficits: Secondary | ICD-10-CM

## 2020-09-08 DIAGNOSIS — F29 Unspecified psychosis not due to a substance or known physiological condition: Secondary | ICD-10-CM | POA: Diagnosis not present

## 2020-09-08 NOTE — Progress Notes (Deleted)
Location:  [facility] Room Number: 118-A Place of Service:  SNF (31) Provider:  Kenard Gower, DNP, FNP-BC  Patient Care Team: Pecola Lawless, MD as PCP - General (Internal Medicine)  Extended Emergency Contact Information Primary Emergency Contact: Pascal Lux of [place] Mobile Phone: [phone] Relation: Son Secondary Emergency Contact: Gillermina Hu Home Phone: [phone] Relation: Relative  Code Status:  FULL CODE  Goals of care: Advanced Directive information Advanced Directives 08/27/2020  Does Patient Have a Medical Advance Directive? No  Would patient like information on creating a medical advance directive? No - Patient declined     Chief Complaint  Patient presents with  . Acute Visit    Patient is seen for elevated blood pressures    HPI:  Pt is a 74 y.o. male seen today for elevated blood pressures. He is a long-term care resident of [facility]. He has a PMH of HTN, HLD, GERD, cerebral infarction, and COVID-19 pneumonia. ***   Past Medical History:  Diagnosis Date  . Acute respiratory failure with hypoxia (HCC)   . Cognitive communication deficit   . Constipation   . Dysphagia   . Essential hypertension   . GERD without esophagitis   . Gout   . History of COVID-19   . Metabolic encephalopathy   . Muscle weakness   . Other sequelae of cerebral infarction   . Pneumonia due to COVID-19 virus 2019   History reviewed. No pertinent surgical history.  Allergies  Allergen Reactions  . Atorvastatin Itching and Rash    Outpatient Encounter Medications as of 09/08/2020  Medication Sig  . acetaminophen (TYLENOL) 325 MG tablet Take 2 tablets (650 mg total) by mouth every 6 (six) hours as needed for mild pain or headache (fever >/= 101).  Marland Kitchen albuterol (VENTOLIN HFA) 108 (90 Base) MCG/ACT inhaler Inhale 2 puffs into the lungs every 4 (four) hours as needed for wheezing or  shortness of breath.  Marland Kitchen amLODipine (NORVASC) 10 MG tablet Take 10 mg by mouth daily.  Marland Kitchen aspirin EC 81 MG tablet Take 1 tablet (81 mg total) by mouth daily.  . benazepril (LOTENSIN) 5 MG tablet Take 5 mg by mouth daily.  . bisacodyl (DULCOLAX) 10 MG suppository Place 10 mg rectally daily as needed for moderate constipation.  . cloNIDine (CATAPRES - DOSED IN MG/24 HR) 0.1 mg/24hr patch Place 0.1 mg onto the skin once a week.  . magnesium hydroxide (MILK OF MAGNESIA) 400 MG/5ML suspension Take 30 mLs by mouth daily as needed for mild constipation.  . metoprolol tartrate (LOPRESSOR) 25 MG tablet Take 25 mg by mouth 2 (two) times daily.  . Nutritional Supplement LIQD Take 120 mLs by mouth in the morning and at bedtime. MedPass  . ondansetron (ZOFRAN) 4 MG tablet Take 1 tablet (4 mg total) by mouth every 6 (six) hours as needed for nausea.  . polyethylene glycol (MIRALAX / GLYCOLAX) 17 g packet Take 17 g by mouth daily as needed for mild constipation.  . QUEtiapine (SEROQUEL) 25 MG tablet Take 12.5 mg by mouth 2 (two) times daily.  . SODIUM PHOSPHATES RE Place 1 each rectally daily as needed.   No facility-administered encounter medications on file as of 09/08/2020.    Review of Systems  GENERAL: No change in appetite, no fatigue, no weight changes, no fever, chills or weakness SKIN: Denies rash, itching, wounds, ulcer sores, or nail abnormalities EYES: Denies change in vision, dry eyes, eye pain, itching or  discharge EARS: Denies change in hearing, ringing in ears, or earache NOSE: Denies nasal congestion or epistaxis MOUTH and THROAT: Denies oral discomfort, gingival pain or bleeding, pain from teeth or hoarseness   RESPIRATORY: no cough, SOB, DOE, wheezing, hemoptysis CARDIAC: No chest pain, edema or palpitations GI: No abdominal pain, diarrhea, constipation, heart burn, nausea or vomiting GU: Denies dysuria, frequency, hematuria, incontinence, or discharge MUSCULOSKELETAL: Denies joint  pain, muscle pain, back pain, restricted movement, or unusual weakness CIRCULATION: Denies claudication, edema of legs, varicosities, or cold extremities NEUROLOGICAL: Denies dizziness, syncope, numbness, or headache PSYCHIATRIC: Denies feelings of depression or anxiety. No report of hallucinations, insomnia, paranoia, or agitation ENDOCRINE: Denies polyphagia, polyuria, polydipsia, heat or cold intolerance HEME/LYMPH: Denies excessive bruising, petechia, enlarged lymph nodes, or bleeding problems IMMUNOLOGIC: Denies history of frequent infections, AIDS, or use of immunosuppressive agents   Immunization History  Administered Date(s) Administered  . Influenza, High Dose Seasonal PF 08/14/2014  . Moderna SARS-COVID-2 Vaccination 05/20/2020, 06/16/2020  . Pneumococcal Polysaccharide-23 03/04/2020   Pertinent  Health Maintenance Due  Topic Date Due  . COLONOSCOPY  Never done  . INFLUENZA VACCINE  02/12/2021 (Originally 06/15/2020)  . PNA vac Low Risk Adult (2 of 2 - PCV13) 03/04/2021    Vitals:   09/08/20 1242 09/08/20 1243  BP: (!) 184/100 (!) 145/93  Pulse: 78   Resp: 19   Temp: 97.9 F (36.6 C)   TempSrc: Oral   Weight: 169 lb 6.4 oz (76.8 kg)   Height: 5\' 8"  (1.727 m)    Body mass index is 25.76 kg/m.  Physical Exam  GENERAL APPEARANCE: Well nourished. In no acute distress. Normal body habitus SKIN:  Skin is warm and dry. There are no suspicious lesions or rash HEAD: Normal in size and contour. No evidence of trauma EYES: Lids open and close normally. No blepharitis, entropion or ectropion. PERRL. Conjunctivae are clear and sclerae are white. Lenses are without opacity EARS: Pinnae are normal. Patient hears normal voice tunes of the examiner MOUTH and THROAT: Lips are without lesions. Oral mucosa is moist and without lesions. Tongue is normal in shape, size, and color and without lesions NECK: supple, trachea midline, no neck masses, no thyroid tenderness, no  thyromegaly LYMPHATICS: No LAN in the neck, no supraclavicular LAN RESPIRATORY: Breathing is even & unlabored, BS CTAB CARDIAC: RRR, no murmur,no extra heart sounds, no edema GI: Abdomen soft, normal BS, no masses, no tenderness, no hepatomegaly, no splenomegaly MUSCULOSKELETAL: No deformities. Movement at each extremity is full and painless. Strength is 5/5 at each extremity. Back is without kyphosis or scoliosis CIRCULATION: Pedal pulses are 2+. There is no edema of the legs, ankles and feet NEUROLOGICAL: There is no tremor. Speech is clear PSYCHIATRIC: Alert and oriented X 3. Affect and behavior are appropriate  Labs reviewed: Recent Labs    03/01/20 0827 03/01/20 0827 03/02/20 0453 03/04/20 0319 03/12/20 0000  NA 139   < > 140 139 145  K 3.1*   < > 3.9 3.6 4.3  CL 106   < > 108 109 108  CO2 21*   < > 20* 20* 21  GLUCOSE 102*  --  109* 111*  --   BUN 17   < > 17 21 16   CREATININE 1.19   < > 1.19 1.20 1.0  CALCIUM 8.3*   < > 9.0 8.7* 9.1   < > = values in this interval not displayed.   Recent Labs    02/29/20 0303 02/29/20 0303 03/01/20  0827 03/02/20 0453 03/12/20 0000  AST 25   < > 33 39 35  ALT 22   < > 28 38 64*  ALKPHOS 47   < > 54 67 100  BILITOT 0.5  --  0.8 1.3*  --   PROT 5.3*  --  5.7* 7.3  --   ALBUMIN 2.4*   < > 2.7* 3.2* 3.3*   < > = values in this interval not displayed.   Recent Labs    03/01/20 0827 03/01/20 0827 03/02/20 0453 03/02/20 0453 03/04/20 0319 03/12/20 0000 03/17/20 0000 04/09/20 0000 05/26/20 0000  WBC 8.0   < > 12.5*   < > 12.0*   < > 11.3 7.3 7.3  NEUTROABS 5.3   < > 9.1*   < > 8.3*   < > 8 3 3   HGB 16.5   < > 19.2*   < > 17.8*   < > 15.5 15.1 15.1  HCT 47.5   < > 55.1*   < > 51.2   < > 46 46 46  MCV 92.1  --  91.7  --  90.8  --   --   --   --   PLT 244   < > 330   < > 353   < > 275 322 322   < > = values in this interval not displayed.   Lab Results  Component Value Date   TSH 0.631 02/29/2020   Lab Results  Component  Value Date   HGBA1C 5.6 02/26/2020    Assessment/Plan ***   Family/ staff Communication: ***  Labs/tests ordered:  ***  Goals of care:   Long-term care.   02/28/2020, DNP, MSN, FNP-BC [facility] [phone] (Monday-Friday 8:00 a.m. - 5:00 p.m.) [phone] (after hours)

## 2020-09-09 NOTE — Progress Notes (Addendum)
Location:  [facility] Room Number: 118-A Place of Service:  SNF (31) Provider:  Kenard Gower, DNP, FNP-BC  Patient Care Team: Pecola Lawless, MD as PCP - General (Internal Medicine)  Extended Emergency Contact Information Primary Emergency Contact: Pascal Lux of [place] Mobile Phone: [phone] Relation: Son Secondary Emergency Contact: Gillermina Hu Home Phone: [phone] Relation: Relative  Code Status:  FULL CODE  Goals of care: Advanced Directive information Advanced Directives 08/27/2020  Does Patient Have a Medical Advance Directive? No  Would patient like information on creating a medical advance directive? No - Patient declined     Chief Complaint  Patient presents with  . Acute Visit    Patient seen for elevated blood pressure    HPI:  Pt is a 74 y.o. male seen today for an acute visit secondary to elevated blood pressures.  He is a long-term care resident [facility].  He has a PMH of hypertension, hyperlipidemia, GERD, cerebral infarction and COVID-19 pneumonia. SBPs ranging from 144 to 184. He denies having headache, dizziness nor SOB. Staff reported that he has been refusing his oral antihypertensive. Current medications for hypertension are 1) amlodipine 10 mg daily, 2) Lotensin 5 mg daily and 3) Metoprolol tartrate 25 mg BID.   Past Medical History:  Diagnosis Date  . Acute respiratory failure with hypoxia (HCC)   . Cognitive communication deficit   . Constipation   . Dysphagia   . Essential hypertension   . GERD without esophagitis   . Gout   . History of COVID-19   . Metabolic encephalopathy   . Muscle weakness   . Other sequelae of cerebral infarction   . Pneumonia due to COVID-19 virus 2019   No past surgical history on file.  Allergies  Allergen Reactions  . Atorvastatin Itching and Rash    Outpatient Encounter Medications as of 09/08/2020  Medication Sig    . acetaminophen (TYLENOL) 325 MG tablet Take 2 tablets (650 mg total) by mouth every 6 (six) hours as needed for mild pain or headache (fever >/= 101).  Marland Kitchen albuterol (VENTOLIN HFA) 108 (90 Base) MCG/ACT inhaler Inhale 2 puffs into the lungs every 4 (four) hours as needed for wheezing or shortness of breath.  Marland Kitchen amLODipine (NORVASC) 10 MG tablet Take 10 mg by mouth daily.  Marland Kitchen aspirin EC 81 MG tablet Take 1 tablet (81 mg total) by mouth daily.  . benazepril (LOTENSIN) 5 MG tablet Take 5 mg by mouth daily.  . bisacodyl (DULCOLAX) 10 MG suppository Place 10 mg rectally daily as needed for moderate constipation.  . cloNIDine (CATAPRES - DOSED IN MG/24 HR) 0.1 mg/24hr patch Place 0.1 mg onto the skin once a week.  . magnesium hydroxide (MILK OF MAGNESIA) 400 MG/5ML suspension Take 30 mLs by mouth daily as needed for mild constipation.  . metoprolol tartrate (LOPRESSOR) 25 MG tablet Take 25 mg by mouth 2 (two) times daily.  . Nutritional Supplement LIQD Take 120 mLs by mouth in the morning and at bedtime. MedPass  . ondansetron (ZOFRAN) 4 MG tablet Take 1 tablet (4 mg total) by mouth every 6 (six) hours as needed for nausea.  . polyethylene glycol (MIRALAX / GLYCOLAX) 17 g packet Take 17 g by mouth daily as needed for mild constipation.  . QUEtiapine (SEROQUEL) 25 MG tablet Take 12.5 mg by mouth 2 (two) times daily.  . SODIUM PHOSPHATES RE Place 1 each rectally daily as needed.   No facility-administered  encounter medications on file as of 09/08/2020.    Review of Systems  GENERAL: No change in appetite, no fatigue, no weight changes, no fever, chills or weakness MOUTH and THROAT: Denies oral discomfort, gingival pain or bleeding RESPIRATORY: no cough, SOB, DOE, wheezing, hemoptysis CARDIAC: No chest pain, edema or palpitations GI: No abdominal pain, diarrhea, constipation, heart burn, nausea or vomiting GU: Denies dysuria, frequency, hematuria, incontinence, or discharge NEUROLOGICAL: Denies  dizziness, syncope, numbness, or headache PSYCHIATRIC: Refuses oral medications   Immunization History  Administered Date(s) Administered  . Influenza, High Dose Seasonal PF 08/14/2014  . Moderna SARS-COVID-2 Vaccination 05/20/2020, 06/16/2020  . Pneumococcal Polysaccharide-23 03/04/2020   Pertinent  Health Maintenance Due  Topic Date Due  . COLONOSCOPY  Never done  . INFLUENZA VACCINE  02/12/2021 (Originally 06/15/2020)  . PNA vac Low Risk Adult (2 of 2 - PCV13) 03/04/2021    Vitals:   09/09/20 0849 09/09/20 0851  BP: (!) 184/100 (!) 145/93  Pulse: 78   Resp: 19   Temp: 97.9 F (36.6 C)   TempSrc: Oral   Weight: 169 lb 6.4 oz (76.8 kg)   Height: 5\' 8"  (1.727 m)    Body mass index is 25.76 kg/m.  Physical Exam  GENERAL APPEARANCE: Well nourished. In no acute distress. Normal body habitus SKIN:  Skin is warm and dry.  MOUTH and THROAT: Lips are without lesions. Oral mucosa is moist and without lesions.  RESPIRATORY: Breathing is even & unlabored, BS CTAB CARDIAC: RRR, no murmur,no extra heart sounds, no edema GI: Abdomen soft, normal BS, no masses, no tenderness EXTREMITIES:  Able to move X 4 extremities NEUROLOGICAL: There is no tremor. Speech is clear. Alert to self, disoriented to time and place. PSYCHIATRIC:  Affect and behavior are appropriate  Labs reviewed: Recent Labs    03/01/20 0827 03/01/20 0827 03/02/20 0453 03/04/20 0319 03/12/20 0000  NA 139   < > 140 139 145  K 3.1*   < > 3.9 3.6 4.3  CL 106   < > 108 109 108  CO2 21*   < > 20* 20* 21  GLUCOSE 102*  --  109* 111*  --   BUN 17   < > 17 21 16   CREATININE 1.19   < > 1.19 1.20 1.0  CALCIUM 8.3*   < > 9.0 8.7* 9.1   < > = values in this interval not displayed.   Recent Labs    02/29/20 0303 02/29/20 0303 03/01/20 0827 03/02/20 0453 03/12/20 0000  AST 25   < > 33 39 35  ALT 22   < > 28 38 64*  ALKPHOS 47   < > 54 67 100  BILITOT 0.5  --  0.8 1.3*  --   PROT 5.3*  --  5.7* 7.3  --     ALBUMIN 2.4*   < > 2.7* 3.2* 3.3*   < > = values in this interval not displayed.   Recent Labs    03/01/20 0827 03/01/20 0827 03/02/20 0453 03/02/20 0453 03/04/20 0319 03/12/20 0000 03/17/20 0000 04/09/20 0000 05/26/20 0000  WBC 8.0   < > 12.5*   < > 12.0*   < > 11.3 7.3 7.3  NEUTROABS 5.3   < > 9.1*   < > 8.3*   < > 8 3 3   HGB 16.5   < > 19.2*   < > 17.8*   < > 15.5 15.1 15.1  HCT 47.5   < > 55.1*   < >  51.2   < > 46 46 46  MCV 92.1  --  91.7  --  90.8  --   --   --   --   PLT 244   < > 330   < > 353   < > 275 322 322   < > = values in this interval not displayed.   Lab Results  Component Value Date   TSH 0.631 02/29/2020   Lab Results  Component Value Date   HGBA1C 5.6 02/26/2020    Assessment/Plan  1. Uncontrolled hypertension -  Will start on Clonidine  0.1 mg/day patch weekly -  Continue Amlodipine and Lotensin and Metoprolol tartrate - monitor BPs  2. History of CVA (cerebrovascular accident) - stable, continue Aspirin EC 81 mg daily  3. Psychosis, unspecified psychosis type (HCC) -  Continue Quetiapine 25 mg 1/2 tab = 12.5 mg BID    Family/ staff Communication:  Discussed plan of care with resident and charge nurse.  Labs/tests ordered:  None  Goals of care:   Long-term care   Kenard Gower, DNP, MSN, FNP-BC [facility] [phone] (Monday-Friday 8:00 a.m. - 5:00 p.m.) [phone] (after hours)

## 2020-09-09 NOTE — Progress Notes (Signed)
This encounter was created in error - please disregard.

## 2020-09-19 ENCOUNTER — Non-Acute Institutional Stay (SKILLED_NURSING_FACILITY): Payer: Medicare Other | Admitting: Adult Health

## 2020-09-19 ENCOUNTER — Encounter: Payer: Self-pay | Admitting: Adult Health

## 2020-09-19 DIAGNOSIS — I10 Essential (primary) hypertension: Secondary | ICD-10-CM | POA: Diagnosis not present

## 2020-09-19 DIAGNOSIS — F0151 Vascular dementia with behavioral disturbance: Secondary | ICD-10-CM

## 2020-09-19 DIAGNOSIS — Z8673 Personal history of transient ischemic attack (TIA), and cerebral infarction without residual deficits: Secondary | ICD-10-CM | POA: Diagnosis not present

## 2020-09-19 DIAGNOSIS — F01518 Vascular dementia, unspecified severity, with other behavioral disturbance: Secondary | ICD-10-CM

## 2020-09-19 NOTE — Progress Notes (Signed)
Location:  [facility] Room Number: 118-A Place of Service:  SNF (31) Provider:  Kenard Gower, DNP, FNP-BC  Patient Care Team: Pecola Lawless, MD as PCP - General (Internal Medicine)  Extended Emergency Contact Information Primary Emergency Contact: Pascal Lux of [place] Mobile Phone: [phone] Relation: Son Secondary Emergency Contact: Gillermina Hu Home Phone: [phone] Relation: Relative  Code Status:  FULL CODE  Goals of care: Advanced Directive information Advanced Directives 08/27/2020  Does Patient Have a Medical Advance Directive? No  Would patient like information on creating a medical advance directive? No - Patient declined     Chief Complaint  Patient presents with   Medical Management of Chronic Issues    Routine [facility] visit   Quality Metric Gaps    Stool for occult blood x3 was ordered 05/2020, but results have not been received    HPI:  Pt is a 74 y.o. male seen today for medical management of chronic diseases.  He is a long-term care resident of [facility].  He has a PMH of hypertension, hyperlipidemia, GERD, cerebral infarction and COVID-19 pneumonia. SBPs ranging from 127 to 145. He was recently started on Clonidine patch 0.1 mg/day weekly. Staff reported that he still refuses his oral medications. He stated that he "is healthy and does not need medications. Jesus is my healer!" He can be seen sitting on his wheelchair by the hallway while reading the bible. He currently takes ASA EC 81 mg daily for history of CVA.   Past Medical History:  Diagnosis Date   Acute respiratory failure with hypoxia (HCC)    Cognitive communication deficit    Constipation    Dysphagia    Essential hypertension    GERD without esophagitis    Gout    History of COVID-19    Metabolic encephalopathy    Muscle weakness    Other sequelae of cerebral infarction     Pneumonia due to COVID-19 virus 2019   History reviewed. No pertinent surgical history.  Allergies  Allergen Reactions   Atorvastatin Itching and Rash    Outpatient Encounter Medications as of 09/19/2020  Medication Sig   acetaminophen (TYLENOL) 325 MG tablet Take 2 tablets (650 mg total) by mouth every 6 (six) hours as needed for mild pain or headache (fever >/= 101).   albuterol (VENTOLIN HFA) 108 (90 Base) MCG/ACT inhaler Inhale 2 puffs into the lungs every 4 (four) hours as needed for wheezing or shortness of breath.   amLODipine (NORVASC) 10 MG tablet Take 10 mg by mouth daily.   aspirin EC 81 MG tablet Take 1 tablet (81 mg total) by mouth daily.   benazepril (LOTENSIN) 5 MG tablet Take 5 mg by mouth daily.   bisacodyl (DULCOLAX) 10 MG suppository Place 10 mg rectally daily as needed for moderate constipation.   cloNIDine (CATAPRES - DOSED IN MG/24 HR) 0.1 mg/24hr patch Place 0.1 mg onto the skin once a week.   magnesium hydroxide (MILK OF MAGNESIA) 400 MG/5ML suspension Take 30 mLs by mouth daily as needed for mild constipation.   metoprolol tartrate (LOPRESSOR) 25 MG tablet Take 25 mg by mouth 2 (two) times daily.   Nutritional Supplement LIQD Take 120 mLs by mouth in the morning and at bedtime. MedPass   ondansetron (ZOFRAN) 4 MG tablet Take 1 tablet (4 mg total) by mouth every 6 (six) hours as needed for nausea.   polyethylene glycol (MIRALAX / GLYCOLAX) 17 g packet Take  17 g by mouth daily as needed for mild constipation.   QUEtiapine (SEROQUEL) 25 MG tablet Take 12.5 mg by mouth 2 (two) times daily.   SODIUM PHOSPHATES RE Place 1 each rectally daily as needed.   No facility-administered encounter medications on file as of 09/19/2020.    Review of Systems  GENERAL: No change in appetite, no fatigue, no weight changes, no fever, chills or weakness MOUTH and THROAT: Denies oral discomfort, gingival pain or bleeding, pain from teeth or hoarseness   RESPIRATORY: no  cough, SOB, DOE, wheezing, hemoptysis CARDIAC: No chest pain, edema or palpitations GI: No abdominal pain, diarrhea, constipation, heart burn, nausea or vomiting GU: Denies dysuria, frequency, hematuria or discharge NEUROLOGICAL: Denies dizziness, syncope, numbness, or headache PSYCHIATRIC: Refuses oral medications   Immunization History  Administered Date(s) Administered   Influenza, High Dose Seasonal PF 08/14/2014   Moderna SARS-COVID-2 Vaccination 05/20/2020, 06/16/2020   Pneumococcal Polysaccharide-23 03/04/2020   Pertinent  Health Maintenance Due  Topic Date Due   COLONOSCOPY  Never done   INFLUENZA VACCINE  02/12/2021 (Originally 06/15/2020)   PNA vac Low Risk Adult (2 of 2 - PCV13) 03/04/2021    Vitals:   09/19/20 1123 09/19/20 1127  BP: (!) 137/93 127/81  Pulse: 75   Resp: 20   Temp: 97.9 F (36.6 C)   TempSrc: Oral   Weight: 169 lb 12.8 oz (77 kg)   Height: 5\' 8"  (1.727 m)    Body mass index is 25.82 kg/m.  Physical Exam  GENERAL APPEARANCE: Well nourished. In no acute distress. Normal body habitus SKIN:  Skin is warm and dry.  MOUTH and THROAT: Lips are without lesions. Oral mucosa is moist and without lesions.  RESPIRATORY: Breathing is even & unlabored, BS CTAB CARDIAC: RRR, no murmur,no extra heart sounds, no edema GI: Abdomen soft, normal BS, no masses, no tenderness EXTREMITIES:  Able to move X 4 extremities NEUROLOGICAL: There is no tremor. Speech is clear. Alert to self, disoriented to time and place. PSYCHIATRIC:  Affect and behavior are appropriate  Labs reviewed: Recent Labs    03/01/20 0827 03/01/20 0827 03/02/20 0453 03/04/20 0319 03/12/20 0000  NA 139   < > 140 139 145  K 3.1*   < > 3.9 3.6 4.3  CL 106   < > 108 109 108  CO2 21*   < > 20* 20* 21  GLUCOSE 102*  --  109* 111*  --   BUN 17   < > 17 21 16   CREATININE 1.19   < > 1.19 1.20 1.0  CALCIUM 8.3*   < > 9.0 8.7* 9.1   < > = values in this interval not displayed.   Recent  Labs    02/29/20 0303 02/29/20 0303 03/01/20 0827 03/02/20 0453 03/12/20 0000  AST 25   < > 33 39 35  ALT 22   < > 28 38 64*  ALKPHOS 47   < > 54 67 100  BILITOT 0.5  --  0.8 1.3*  --   PROT 5.3*  --  5.7* 7.3  --   ALBUMIN 2.4*   < > 2.7* 3.2* 3.3*   < > = values in this interval not displayed.   Recent Labs    03/01/20 0827 03/01/20 0827 03/02/20 0453 03/02/20 0453 03/04/20 0319 03/12/20 0000 03/17/20 0000 04/09/20 0000 05/26/20 0000  WBC 8.0   < > 12.5*   < > 12.0*   < > 11.3 7.3 7.3  NEUTROABS 5.3   < >  9.1*   < > 8.3*   < > 8 3 3   HGB 16.5   < > 19.2*   < > 17.8*   < > 15.5 15.1 15.1  HCT 47.5   < > 55.1*   < > 51.2   < > 46 46 46  MCV 92.1  --  91.7  --  90.8  --   --   --   --   PLT 244   < > 330   < > 353   < > 275 322 322   < > = values in this interval not displayed.   Lab Results  Component Value Date   TSH 0.631 02/29/2020   Lab Results  Component Value Date   HGBA1C 5.6 02/26/2020    Assessment/Plan  1. History of CVA (cerebrovascular accident) -  Stable, continue ASA  2. Essential hypertension - BPs improved, continue Clonidine patch, Metoprolol Tartrate, Lotensin and Amlodipine - continue to monitor BPs  3. Vascular dementia with behavior disturbance (HCC) - BIMS score 2/15, ranging in severe cognitive deficit - continue supportive care and fall precautions     Family/ staff Communication: Discussed plan of care with resident and charge nurse.  Labs/tests ordered: None  Goals of care:   Long-term care   3/15, DNP, MSN, FNP-BC [facility] and Adult [facility] [phone] (Monday-Friday 8:00 a.m. - 5:00 p.m.) [phone] (after hours)

## 2020-10-14 ENCOUNTER — Encounter: Payer: Self-pay | Admitting: Internal Medicine

## 2020-10-14 NOTE — Progress Notes (Deleted)
   NURSING HOME LOCATION:  [facility] ROOM NUMBER:  118-A  CODE STATUS:  FULL CODE  PCP:  Pecola Lawless, MD  [address] [place] [address]   This is a nursing facility follow up for specific acute issue of     Interim medical record and care since last [facility] visit was updated with review of diagnostic studies and change in clinical status since last visit were documented.  HPI:  Review of systems: Dementia invalidated responses. Date given as   Constitutional: No fever, significant weight change, fatigue  Eyes: No redness, discharge, pain, vision change ENT/mouth: No nasal congestion,  purulent discharge, earache, change in hearing, sore throat  Cardiovascular: No chest pain, palpitations, paroxysmal nocturnal dyspnea, claudication, edema  Respiratory: No cough, sputum production, hemoptysis, DOE, significant snoring, apnea   Gastrointestinal: No heartburn, dysphagia, abdominal pain, nausea /vomiting, rectal bleeding, melena, change in bowels Genitourinary: No dysuria, hematuria, pyuria, incontinence, nocturia Musculoskeletal: No joint stiffness, joint swelling, weakness, pain Dermatologic: No rash, pruritus, change in appearance of skin Neurologic: No dizziness, headache, syncope, seizures, numbness, tingling Psychiatric: No significant anxiety, depression, insomnia, anorexia Endocrine: No change in hair/skin/nails, excessive thirst, excessive hunger, excessive urination  Hematologic/lymphatic: No significant bruising, lymphadenopathy, abnormal bleeding Allergy/immunology: No itchy/watery eyes, significant sneezing, urticaria, angioedema  Physical exam:  Pertinent or positive findings: General appearance: Adequately nourished; no acute distress, increased work of breathing is present.   Lymphatic: No lymphadenopathy about the head, neck, axilla. Eyes: No conjunctival inflammation or lid edema is present. There is no scleral icterus. Ears:   External ear exam shows no significant lesions or deformities.   Nose:  External nasal examination shows no deformity or inflammation. Nasal mucosa are pink and moist without lesions, exudates Oral exam:  Lips and gums are healthy appearing. There is no oropharyngeal erythema or exudate. Neck:  No thyromegaly, masses, tenderness noted.    Heart:  Normal rate and regular rhythm. S1 and S2 normal without gallop, murmur, click, rub .  Lungs: Chest clear to auscultation without wheezes, rhonchi, rales, rubs. Abdomen: Bowel sounds are normal. Abdomen is soft and nontender with no organomegaly, hernias, masses. GU: Deferred  Extremities:  No cyanosis, clubbing, edema  Neurologic exam : Cn 2-7 intact Strength equal  in upper & lower extremities Balance, Rhomberg, finger to nose testing could not be completed due to clinical state Deep tendon reflexes are equal Skin: Warm & dry w/o tenting. No significant lesions or rash.  See summary under each active problem in the Problem List with associated updated therapeutic plan

## 2020-10-15 DIAGNOSIS — R41841 Cognitive communication deficit: Secondary | ICD-10-CM | POA: Diagnosis not present

## 2020-10-15 DIAGNOSIS — F0390 Unspecified dementia without behavioral disturbance: Secondary | ICD-10-CM | POA: Diagnosis not present

## 2020-10-15 NOTE — Progress Notes (Signed)
This encounter was created in error - please disregard.

## 2020-10-17 ENCOUNTER — Non-Acute Institutional Stay (SKILLED_NURSING_FACILITY): Payer: Medicare Other | Admitting: Internal Medicine

## 2020-10-17 ENCOUNTER — Encounter: Payer: Self-pay | Admitting: Internal Medicine

## 2020-10-17 DIAGNOSIS — R29818 Other symptoms and signs involving the nervous system: Secondary | ICD-10-CM

## 2020-10-17 DIAGNOSIS — R4189 Other symptoms and signs involving cognitive functions and awareness: Secondary | ICD-10-CM | POA: Diagnosis not present

## 2020-10-17 DIAGNOSIS — F0150 Vascular dementia without behavioral disturbance: Secondary | ICD-10-CM | POA: Diagnosis not present

## 2020-10-17 NOTE — Progress Notes (Signed)
   NURSING HOME LOCATION:  [facility] ROOM NUMBER: 118 A  CODE STATUS: Full code  PCP: Titus Dubin. Alwyn Ren, MD [address] [place] [address]  This is a nursing facility follow up for specific acute issue of determination of competency.  Interim medical record and care since last [facility] visit was updated with review of diagnostic studies and change in clinical status since last visit were documented.  HPI: I was requested to assess the patient's competency in the setting of prior strokes with CNS imaging suggesting significant vascular etiology of his dementia. Of possible significance was history of Covid pneumonia in 2019 raising the possibility of Covid infection "long hauler" status related "mental fog". The [employer] mental status testing [facility]) was completed upon his admission to the SNF on 03/07/2020.  His score was 0; he declined to complete the drawing portion.  The study was repeated on August 18, 2020 and revealed a score of 4 out of 30, again compatible with severe dementia.  Dementia would be less than 20.  Upon request for this competency evaluation I ordered a repeat SLUMS test which was completed 12/1 and revealed a score of 6 out of 30. Dr. Terrace Arabia, Neurologist had seen the patient on Mar 31, 2020 and diagnosed dementia. The minimal improvement in the serial MMSE scores may be attributed to the recovery from Covid; but he remains severely demented.  Review of systems: Dementia invalidated responses. All responses were negative except when I first asked him if he were cold as he was wearing a woolen jacket and had the covers over him in bed. Initially he said yes but then later said he was " just teasing". He could not tell me when he retired nor could he tell me his job description. 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 When I ask again he stated "normal work". Staff denies any behavioral issues. He does tend to spend most of his  day up in the wheelchair moving about the facility.  Physical exam:  Pertinent or positive findings: He is unshaven. Lids are puffy. He has ptosis more so on the left. Severe caries are noted of the mandibular teeth. Heart sounds are markedly distant. Rate clinically is slow. Pedal pulses were surprisingly strong. Strength and opposition was fair in all extremities.  General appearance: Adequately nourished; no acute distress, increased work of breathing is present.   Lymphatic: No lymphadenopathy about the head, neck, axilla. Eyes: No conjunctival inflammation or lid edema is present. There is no scleral icterus. Ears:  External ear exam shows no significant lesions or deformities.   Nose:  External nasal examination shows no deformity or inflammation. Nasal mucosa are pink and moist without lesions, exudates Oral exam:  Lips and gums are healthy appearing. There is no oropharyngeal erythema or exudate. Neck:  No thyromegaly, masses, tenderness noted.    Heart:  No gallop, murmur, click, rub .  Lungs:  without wheezes, rhonchi, rales, rubs. Abdomen: Bowel sounds are normal. Abdomen is soft and nontender with no organomegaly, hernias, masses. GU: Deferred  Extremities:  No cyanosis, clubbing, edema  Neurologic exam :Balance, Rhomberg, finger to nose testing could not be completed due to clinical state Skin: Warm & dry w/o tenting. No significant lesions or rash.  See summary under each active problem in the Problem List with associated updated therapeutic plan

## 2020-10-17 NOTE — Assessment & Plan Note (Signed)
Form SSA-787 (10-2017) [facility] documenting lack of competency completed.

## 2020-10-17 NOTE — Patient Instructions (Signed)
See assessment and plan under each diagnosis in the problem list and acutely for this visit 

## 2020-10-17 NOTE — Assessment & Plan Note (Addendum)
Staff denies any behavioral issues at this time. The CNS imaging suggests the etiology of his dementia is mainly vascular in nature. The acute Covid infection earlier this year may have contributed to the initial score of zero on mental status testing upon arrival here.

## 2020-10-29 DIAGNOSIS — F29 Unspecified psychosis not due to a substance or known physiological condition: Secondary | ICD-10-CM | POA: Diagnosis not present

## 2020-10-29 DIAGNOSIS — Z9114 Patient's other noncompliance with medication regimen: Secondary | ICD-10-CM | POA: Diagnosis not present

## 2020-10-29 DIAGNOSIS — F4324 Adjustment disorder with disturbance of conduct: Secondary | ICD-10-CM | POA: Diagnosis not present

## 2020-10-30 ENCOUNTER — Non-Acute Institutional Stay (SKILLED_NURSING_FACILITY): Payer: Medicare Other | Admitting: Adult Health

## 2020-10-30 DIAGNOSIS — F29 Unspecified psychosis not due to a substance or known physiological condition: Secondary | ICD-10-CM

## 2020-10-30 DIAGNOSIS — I10 Essential (primary) hypertension: Secondary | ICD-10-CM | POA: Diagnosis not present

## 2020-10-30 DIAGNOSIS — F0151 Vascular dementia with behavioral disturbance: Secondary | ICD-10-CM

## 2020-10-30 DIAGNOSIS — F01518 Vascular dementia, unspecified severity, with other behavioral disturbance: Secondary | ICD-10-CM

## 2020-10-30 DIAGNOSIS — Z8673 Personal history of transient ischemic attack (TIA), and cerebral infarction without residual deficits: Secondary | ICD-10-CM

## 2020-10-30 NOTE — Progress Notes (Signed)
Location:  [facility] Room Number: 118-A Place of Service:  SNF (31) Provider:  Kenard Gower, DNP, FNP-BC  Patient Care Team: Pecola Lawless, MD as PCP - General (Internal Medicine)  Extended Emergency Contact Information Primary Emergency Contact: Pascal Lux of [place] Mobile Phone: [phone] Relation: Son Secondary Emergency Contact: Gillermina Hu Home Phone: [phone] Relation: Relative  Code Status:  FULL CODE  Goals of care: Advanced Directive information Advanced Directives 08/27/2020  Does Patient Have a Medical Advance Directive? No  Would patient like information on creating a medical advance directive? No - Patient declined     Chief Complaint  Patient presents with  . Medical Management of Chronic Issues    Routine [facility] visit    HPI:  Pt is a 74 y.o. male seen today for medical management of chronic diseases.  He is a long-term care resident of [facility].  He has a PMH of hypertension, hyperlipidemia, GERD, cerebral infarction and COVID-19 pneumonia. SBPs ranging from 123 to 139. He takes metoprolol tartrate 25 mg twice a day, Lotensin 5 mg 1 tab daily, clonidine 0.5 mg/day 1 patch weekly and amlodipine 10 mg daily for hypertension.  BIMS score 2/15, ranging in severe cognitive impairment.   Past Medical History:  Diagnosis Date  . Acute respiratory failure with hypoxia (HCC)   . Cognitive communication deficit   . Constipation   . Dysphagia   . Essential hypertension   . GERD without esophagitis   . Gout   . History of COVID-19   . Metabolic encephalopathy   . Muscle weakness   . Other sequelae of cerebral infarction   . Pneumonia due to COVID-19 virus 2019   No past surgical history on file.  Allergies  Allergen Reactions  . Atorvastatin Itching and Rash    Outpatient Encounter Medications as of 10/30/2020  Medication Sig  . SODIUM PHOSPHATES RE  Place 1 each rectally daily as needed.  Marland Kitchen acetaminophen (TYLENOL) 325 MG tablet Take 2 tablets (650 mg total) by mouth every 6 (six) hours as needed for mild pain or headache (fever >/= 101).  Marland Kitchen albuterol (VENTOLIN HFA) 108 (90 Base) MCG/ACT inhaler Inhale 2 puffs into the lungs every 4 (four) hours as needed for wheezing or shortness of breath.  Marland Kitchen amLODipine (NORVASC) 10 MG tablet Take 10 mg by mouth daily.  Marland Kitchen aspirin EC 81 MG tablet Take 1 tablet (81 mg total) by mouth daily.  . benazepril (LOTENSIN) 5 MG tablet Take 5 mg by mouth daily.  . bisacodyl (DULCOLAX) 10 MG suppository Place 10 mg rectally daily as needed for moderate constipation.  . cloNIDine (CATAPRES - DOSED IN MG/24 HR) 0.1 mg/24hr patch Place 0.1 mg onto the skin once a week.  . magnesium hydroxide (MILK OF MAGNESIA) 400 MG/5ML suspension Take 30 mLs by mouth daily as needed for mild constipation.  . metoprolol tartrate (LOPRESSOR) 25 MG tablet Take 25 mg by mouth 2 (two) times daily.  . Nutritional Supplement LIQD Take 120 mLs by mouth in the morning and at bedtime. MedPass  . ondansetron (ZOFRAN) 4 MG tablet Take 1 tablet (4 mg total) by mouth every 6 (six) hours as needed for nausea.  . polyethylene glycol (MIRALAX / GLYCOLAX) 17 g packet Take 17 g by mouth daily as needed for mild constipation.  . QUEtiapine (SEROQUEL) 25 MG tablet Take 12.5 mg by mouth 2 (two) times daily.   No facility-administered encounter medications on file  as of 10/30/2020.    Review of Systems  GENERAL: No change in appetite, no fatigue, no weight changes, no fever, chills or weakness MOUTH and THROAT: Denies oral discomfort, gingival pain or bleeding RESPIRATORY: no cough, SOB, DOE, wheezing, hemoptysis CARDIAC: No chest pain, edema or palpitations GI: No abdominal pain, diarrhea, constipation, heart burn, nausea or vomiting GU: Denies dysuria, frequency, hematuria or discharge NEUROLOGICAL: Denies dizziness, syncope, numbness, or  headache PSYCHIATRIC: Denies feelings of depression or anxiety. No report of hallucinations, insomnia, paranoia, or agitation   Immunization History  Administered Date(s) Administered  . Influenza, High Dose Seasonal PF 08/14/2014  . Moderna Sars-Covid-2 Vaccination 05/20/2020, 06/16/2020  . Pneumococcal Polysaccharide-23 03/04/2020   Pertinent  Health Maintenance Due  Topic Date Due  . COLONOSCOPY  Never done  . INFLUENZA VACCINE  02/12/2021 (Originally 06/15/2020)  . PNA vac Low Risk Adult (2 of 2 - PCV13) 03/04/2021    Vitals:   10/30/20 1200  BP: 139/89  Pulse: 63  Resp: 18  Temp: (!) 97.5 F (36.4 C)  TempSrc: Oral  Weight: 167 lb 12.8 oz (76.1 kg)  Height: 5\' 8"  (1.727 m)   Body mass index is 25.51 kg/m.  Physical Exam  GENERAL APPEARANCE: Well nourished. In no acute distress. Normal body habitus SKIN:  Skin is warm and dry.  MOUTH and THROAT: Lips are without lesions. Oral mucosa is moist and without lesions. Tongue is normal in shape, size, and color and without lesions RESPIRATORY: Breathing is even & unlabored, BS CTAB CARDIAC: RRR, no murmur,no extra heart sounds, no edema GI: Abdomen soft, normal BS, no masses, no tenderness NEUROLOGICAL: There is no tremor. Speech is clear. Alert to self, disoriented to time an d place. PSYCHIATRIC:. Affect and behavior are appropriate  Labs reviewed: Recent Labs    03/01/20 0827 03/02/20 0453 03/04/20 0319 03/12/20 0000  NA 139 140 139 145  K 3.1* 3.9 3.6 4.3  CL 106 108 109 108  CO2 21* 20* 20* 21  GLUCOSE 102* 109* 111*  --   BUN 17 17 21 16   CREATININE 1.19 1.19 1.20 1.0  CALCIUM 8.3* 9.0 8.7* 9.1   Recent Labs    02/29/20 0303 03/01/20 0827 03/02/20 0453 03/12/20 0000  AST 25 33 39 35  ALT 22 28 38 64*  ALKPHOS 47 54 67 100  BILITOT 0.5 0.8 1.3*  --   PROT 5.3* 5.7* 7.3  --   ALBUMIN 2.4* 2.7* 3.2* 3.3*   Recent Labs    03/01/20 0827 03/02/20 0453 03/04/20 0319 03/12/20 0000 03/17/20 0000  04/09/20 0000 05/26/20 0000  WBC 8.0 12.5* 12.0*   < > 11.3 7.3 7.3  NEUTROABS 5.3 9.1* 8.3*   < > 8 3 3   HGB 16.5 19.2* 17.8*   < > 15.5 15.1 15.1  HCT 47.5 55.1* 51.2   < > 46 46 46  MCV 92.1 91.7 90.8  --   --   --   --   PLT 244 330 353   < > 275 322 322   < > = values in this interval not displayed.   Lab Results  Component Value Date   TSH 0.631 02/29/2020   Lab Results  Component Value Date   HGBA1C 5.6 02/26/2020    Assessment/Plan  1. Essential hypertension -  BPs stable, continue clonidine patch, amlodipine and Lotensin  2. History of CVA (cerebrovascular accident) -   Stable, continue aspirin  3. Psychosis, unspecified psychosis type (HCC) -Mood is stable,  continue Quetiapine -Followed by psych NP  4. Vascular dementia with behavior disturbance (HCC) -Continue supportive care and fall precautions     Family/ staff Communication: Discussed plan of care with resident and charge nurse.  Labs/tests ordered: None  Goals of care:   Long-term care   Kenard Gower, DNP, MSN, FNP-BC [facility] [phone] (Monday-Friday 8:00 a.m. - 5:00 p.m.) [phone] (after hours) (

## 2020-11-21 ENCOUNTER — Non-Acute Institutional Stay (SKILLED_NURSING_FACILITY): Payer: Medicare Other | Admitting: Adult Health

## 2020-11-21 ENCOUNTER — Encounter: Payer: Self-pay | Admitting: Adult Health

## 2020-11-21 DIAGNOSIS — I10 Essential (primary) hypertension: Secondary | ICD-10-CM

## 2020-11-21 DIAGNOSIS — F29 Unspecified psychosis not due to a substance or known physiological condition: Secondary | ICD-10-CM | POA: Diagnosis not present

## 2020-11-21 DIAGNOSIS — F01518 Vascular dementia, unspecified severity, with other behavioral disturbance: Secondary | ICD-10-CM

## 2020-11-21 DIAGNOSIS — F0151 Vascular dementia with behavioral disturbance: Secondary | ICD-10-CM

## 2020-11-21 DIAGNOSIS — Z1211 Encounter for screening for malignant neoplasm of colon: Secondary | ICD-10-CM

## 2020-11-21 DIAGNOSIS — Z8673 Personal history of transient ischemic attack (TIA), and cerebral infarction without residual deficits: Secondary | ICD-10-CM

## 2020-11-21 NOTE — Progress Notes (Signed)
Location:  [facility] Room Number: 118/A Place of Service:  SNF (31) Provider:  Kenard Gower, DNP, FNP-BC  Patient Care Team: Pecola Lawless, MD as PCP - General (Internal Medicine)  Extended Emergency Contact Information Primary Emergency Contact: Pascal Lux of [place] Mobile Phone: [phone] Relation: Son Secondary Emergency Contact: Gillermina Hu Home Phone: [phone] Relation: Relative  Code Status:  Full Code  Goals of care: Advanced Directive information Advanced Directives 11/21/2020  Does Patient Have a Medical Advance Directive? No  Would patient like information on creating a medical advance directive? No - Patient declined     Chief Complaint  Patient presents with  . Medical Management of Chronic Issues    Routine Visit of Medical Management   . Quality Metric Gaps    Colon cancer screening  . Immunizations    T-Dap    HPI:  Pt is a 75 y.o. male seen today for medical management of chronic diseases.  He has a PMH of hypertension, hyperlipidemia, GERD, cerebral infarction and COVID-19 pneumonia. He was seen on his wheelchair while  in the dayroom. He stated that he is "OK". SBPs ranging from 121 to 155. He takes metoprolol tartrate 25 mg twice a day, amlodipine 10 mg daily and clonidine 0.1 mg/day patch weekly for hypertension.   Past Medical History:  Diagnosis Date  . Acute respiratory failure with hypoxia (HCC)   . Cognitive communication deficit   . Constipation   . Dysphagia   . Essential hypertension   . GERD without esophagitis   . Gout   . History of COVID-19   . Metabolic encephalopathy   . Muscle weakness   . Other sequelae of cerebral infarction   . Pneumonia due to COVID-19 virus 2019   History reviewed. No pertinent surgical history.  Allergies  Allergen Reactions  . Atorvastatin Itching and Rash    Outpatient Encounter Medications as of 11/21/2020  Medication Sig  .  acetaminophen (TYLENOL) 325 MG tablet Take 2 tablets (650 mg total) by mouth every 6 (six) hours as needed for mild pain or headache (fever >/= 101).  Marland Kitchen albuterol (VENTOLIN HFA) 108 (90 Base) MCG/ACT inhaler Inhale 2 puffs into the lungs every 4 (four) hours as needed for wheezing or shortness of breath.  Marland Kitchen amLODipine (NORVASC) 10 MG tablet Take 10 mg by mouth daily.  Marland Kitchen aspirin EC 81 MG tablet Take 1 tablet (81 mg total) by mouth daily.  . benazepril (LOTENSIN) 5 MG tablet Take 5 mg by mouth daily.  . bisacodyl (DULCOLAX) 10 MG suppository Place 10 mg rectally daily as needed for moderate constipation.  . cloNIDine (CATAPRES - DOSED IN MG/24 HR) 0.1 mg/24hr patch Place 0.1 mg onto the skin once a week.  . magnesium hydroxide (MILK OF MAGNESIA) 400 MG/5ML suspension Take 30 mLs by mouth daily as needed for mild constipation.  . metoprolol tartrate (LOPRESSOR) 25 MG tablet Take 25 mg by mouth 2 (two) times daily.  . NON FORMULARY Magic cup qd to prevent weight loss and malnutrition  . Nutritional Supplement LIQD Take 120 mLs by mouth in the morning and at bedtime. MedPass  . ondansetron (ZOFRAN) 4 MG tablet Take 1 tablet (4 mg total) by mouth every 6 (six) hours as needed for nausea.  . polyethylene glycol (MIRALAX / GLYCOLAX) 17 g packet Take 17 g by mouth daily as needed for mild constipation.  . QUEtiapine (SEROQUEL) 25 MG tablet Take 12.5 mg by mouth 2 (two)  times daily.  . SODIUM PHOSPHATES RE Place 1 each rectally daily as needed.   No facility-administered encounter medications on file as of 11/21/2020.    Review of Systems  GENERAL: No change in appetite, no fatigue, no weight changes, no fever, chills or weakness MOUTH and THROAT: Denies oral discomfort, gingival pain or bleeding RESPIRATORY: no cough, SOB, DOE, wheezing, hemoptysis CARDIAC: No chest pain, edema or palpitations GI: No abdominal pain, diarrhea, constipation, heart burn, nausea or vomiting NEUROLOGICAL: Denies dizziness,  syncope, numbness, or headache PSYCHIATRIC: Denies feelings of depression or anxiety. No report of hallucinations, insomnia, paranoia, or agitation    Immunization History  Administered Date(s) Administered  . Influenza, High Dose Seasonal PF 08/14/2014  . Moderna Sars-Covid-2 Vaccination 05/20/2020, 06/16/2020  . Pneumococcal Polysaccharide-23 03/04/2020   Pertinent  Health Maintenance Due  Topic Date Due  . COLONOSCOPY (Pts 45-21yrs Insurance coverage will need to be confirmed)  Never done  . INFLUENZA VACCINE  02/12/2021 (Originally 06/15/2020)  . PNA vac Low Risk Adult (2 of 2 - PCV13) 03/04/2021   No flowsheet data found.   Vitals:   11/21/20 0933  BP: 125/88  Pulse: 78  Resp: 19  Temp: (!) 97.2 F (36.2 C)  SpO2: 97%  Weight: 167 lb 12.8 oz (76.1 kg)  Height: 5\' 8"  (1.727 m)   Body mass index is 25.51 kg/m.  Physical Exam  GENERAL APPEARANCE: Well nourished. In no acute distress. Normal body habitus SKIN:  Skin is warm and dry.  MOUTH and THROAT: Lips are without lesions. Oral mucosa is moist and without lesions. Tongue is normal in shape, size, and color and without lesions RESPIRATORY: Breathing is even & unlabored, BS CTAB CARDIAC: RRR, no murmur,no extra heart sounds, no edema GI: Abdomen soft, normal BS, no masses, no tenderness EXTREMITIES:  Able to move X 4 extremities NEUROLOGICAL: There is no tremor. Speech is clear. Alert to self, disoriented to time and place. PSYCHIATRIC:  Affect and behavior are appropriate  Labs reviewed: Recent Labs    03/01/20 0827 03/02/20 0453 03/04/20 0319 03/12/20 0000  NA 139 140 139 145  K 3.1* 3.9 3.6 4.3  CL 106 108 109 108  CO2 21* 20* 20* 21  GLUCOSE 102* 109* 111*  --   BUN 17 17 21 16   CREATININE 1.19 1.19 1.20 1.0  CALCIUM 8.3* 9.0 8.7* 9.1   Recent Labs    02/29/20 0303 03/01/20 0827 03/02/20 0453 03/12/20 0000  AST 25 33 39 35  ALT 22 28 38 64*  ALKPHOS 47 54 67 100  BILITOT 0.5 0.8 1.3*  --    PROT 5.3* 5.7* 7.3  --   ALBUMIN 2.4* 2.7* 3.2* 3.3*   Recent Labs    03/01/20 0827 03/02/20 0453 03/04/20 0319 03/12/20 0000 03/17/20 0000 04/09/20 0000 05/26/20 0000  WBC 8.0 12.5* 12.0*   < > 11.3 7.3 7.3  NEUTROABS 5.3 9.1* 8.3*   < > 8 3 3   HGB 16.5 19.2* 17.8*   < > 15.5 15.1 15.1  HCT 47.5 55.1* 51.2   < > 46 46 46  MCV 92.1 91.7 90.8  --   --   --   --   PLT 244 330 353   < > 275 322 322   < > = values in this interval not displayed.   Lab Results  Component Value Date   TSH 0.631 02/29/2020   Lab Results  Component Value Date   HGBA1C 5.6 02/26/2020  Assessment/Plan  1. History of CVA (cerebrovascular accident) -  Stable, continue aspirin  2. Essential hypertension -  BPs stable, continue amlodipine, metoprolol titrate and clonidine  3. Psychosis, unspecified psychosis type (HCC) -  Mood is stable, continue Quetiapine 12.5 mg twice a day -Followed by psych NP  4. Vascular dementia with behavior disturbance (HCC) -  BIMS score 2/15, ranging in severe cognitive impairment -  Continue supportive care  5.  Colon Cancer Screening -  Stool occult blood X3   Family/ staff Communication: Discussed plan of care with resident and charge nurse.  Labs/tests ordered: Stool occult blood x3, CBC, CMP and tsh  Goals of care:   Long-term care   Kenard Gower, DNP, MSN, FNP-BC [facility] [phone] (Monday-Friday 8:00 a.m. - 5:00 p.m.) [phone] (after hours)

## 2020-11-26 ENCOUNTER — Encounter: Payer: Self-pay | Admitting: Adult Health

## 2020-11-26 ENCOUNTER — Non-Acute Institutional Stay (SKILLED_NURSING_FACILITY): Payer: Medicare Other | Admitting: Adult Health

## 2020-11-26 DIAGNOSIS — Z7189 Other specified counseling: Secondary | ICD-10-CM | POA: Diagnosis not present

## 2020-11-26 DIAGNOSIS — I10 Essential (primary) hypertension: Secondary | ICD-10-CM | POA: Diagnosis not present

## 2020-11-26 DIAGNOSIS — F0151 Vascular dementia with behavioral disturbance: Secondary | ICD-10-CM

## 2020-11-26 DIAGNOSIS — F29 Unspecified psychosis not due to a substance or known physiological condition: Secondary | ICD-10-CM

## 2020-11-26 DIAGNOSIS — F01518 Vascular dementia, unspecified severity, with other behavioral disturbance: Secondary | ICD-10-CM

## 2020-11-26 DIAGNOSIS — Z8673 Personal history of transient ischemic attack (TIA), and cerebral infarction without residual deficits: Secondary | ICD-10-CM | POA: Diagnosis not present

## 2020-11-26 NOTE — Progress Notes (Signed)
Location:  [facility] Room Number: 118 A Place of Service:  SNF (31) Provider:  Kenard Gower, DNP, FNP-BC  Patient Care Team: Pecola Lawless, MD as PCP - General (Internal Medicine)  Extended Emergency Contact Information Primary Emergency Contact: Pascal Lux of [place] Mobile Phone: [phone] Relation: Son Secondary Emergency Contact: Gillermina Hu Home Phone: [phone] Relation: Relative  Code Status:  FULL CODE  Goals of care: Advanced Directive information Advanced Directives 11/21/2020  Does Patient Have a Medical Advance Directive? No  Would patient like information on creating a medical advance directive? No - Patient declined     Chief Complaint  Patient presents with  . Advance Care Planning    Care Plan Meeting    HPI:  Pt is a 75 y.o. male seen today for a care plan meeting.  He is a long-term care resident of [facility].  He has a PMH of hypertension, hyperlipidemia, GERD, cerebral infarction and COVID-19 pneumonia. Son was called via teleconference but did not answer. Social Cabin crew. Resident declined the care plan meeting. The meeting was attended by Child psychotherapist, NP and life Arts administrator.  Resident remains to be full code.  Discussed medications, vital signs and weights. Staff reported patient refusing shower and sometimes sleeps on furniture. 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 SBPs ranging from 121 to 135. He currently takes Metoprolol tartrate 25 mg BID, Clonidine 0.1 mg/day patch weekly, Lotensin 5 mg daily and Amlodipine 10 mg daily for hypertension. The meeting lasted for 25 minutes.    Past Medical History:  Diagnosis Date  . Acute respiratory failure with hypoxia (HCC)   . Cognitive communication deficit   . Constipation   . Dysphagia   . Essential hypertension   . GERD without esophagitis   . Gout   . History of COVID-19   .  Metabolic encephalopathy   . Muscle weakness   . Other sequelae of cerebral infarction   . Pneumonia due to COVID-19 virus 2019   No past surgical history on file.  Allergies  Allergen Reactions  . Atorvastatin Itching and Rash    Outpatient Encounter Medications as of 11/26/2020  Medication Sig  . acetaminophen (TYLENOL) 325 MG tablet Take 2 tablets (650 mg total) by mouth every 6 (six) hours as needed for mild pain or headache (fever >/= 101).  Marland Kitchen albuterol (VENTOLIN HFA) 108 (90 Base) MCG/ACT inhaler Inhale 2 puffs into the lungs every 4 (four) hours as needed for wheezing or shortness of breath.  Marland Kitchen amLODipine (NORVASC) 10 MG tablet Take 10 mg by mouth daily.  Marland Kitchen aspirin EC 81 MG tablet Take 1 tablet (81 mg total) by mouth daily.  . benazepril (LOTENSIN) 5 MG tablet Take 5 mg by mouth daily.  . bisacodyl (DULCOLAX) 10 MG suppository Place 10 mg rectally daily as needed for moderate constipation.  . cloNIDine (CATAPRES - DOSED IN MG/24 HR) 0.1 mg/24hr patch Place 0.1 mg onto the skin once a week.  . magnesium hydroxide (MILK OF MAGNESIA) 400 MG/5ML suspension Take 30 mLs by mouth daily as needed for mild constipation.  . metoprolol tartrate (LOPRESSOR) 25 MG tablet Take 25 mg by mouth 2 (two) times daily.  . NON FORMULARY Magic cup qd to prevent weight loss and malnutrition  . Nutritional Supplement LIQD Take 120 mLs by mouth in the morning and at bedtime. MedPass  . ondansetron (ZOFRAN) 4 MG tablet Take 1 tablet (4 mg total)  by mouth every 6 (six) hours as needed for nausea.  . polyethylene glycol (MIRALAX / GLYCOLAX) 17 g packet Take 17 g by mouth daily as needed for mild constipation.  . QUEtiapine (SEROQUEL) 25 MG tablet Take 12.5 mg by mouth 2 (two) times daily.  . SODIUM PHOSPHATES RE Place 1 each rectally daily as needed.   No facility-administered encounter medications on file as of 11/26/2020.    Review of Systems  GENERAL: No change in appetite, no fatigue, no weight  changes, no fever, chills or weakness MOUTH and THROAT: Denies oral discomfort, gingival pain or bleeding RESPIRATORY: no cough, SOB, DOE, wheezing, hemoptysis CARDIAC: No chest pain, edema or palpitations GI: No abdominal pain, diarrhea, constipation, heart burn, nausea or vomiting GU: Denies dysuria, frequency, hematuria or discharge NEUROLOGICAL: Denies dizziness, syncope, numbness, or headache PSYCHIATRIC: Denies feelings of depression or anxiety. No report of hallucinations, insomnia, paranoia, or agitation   Immunization History  Administered Date(s) Administered  . Influenza, High Dose Seasonal PF 08/14/2014  . Moderna Sars-Covid-2 Vaccination 05/20/2020, 06/16/2020  . Pneumococcal Polysaccharide-23 03/04/2020   Pertinent  Health Maintenance Due  Topic Date Due  . COLONOSCOPY (Pts 45-85yrs Insurance coverage will need to be confirmed)  Never done  . INFLUENZA VACCINE  02/12/2021 (Originally 06/15/2020)  . PNA vac Low Risk Adult (2 of 2 - PCV13) 03/04/2021    Vitals:   11/26/20 1000  BP: 135/81  Pulse: 84  Resp: 18  Temp: 97.7 F (36.5 C)  Weight: 167 lb 12.8 oz (76.1 kg)  Height: 5\' 8"  (1.727 m)   Body mass index is 25.51 kg/m.  Physical Exam  GENERAL APPEARANCE: Well nourished. In no acute distress. Normal body habitus SKIN:  Skin is warm and dry.  MOUTH and THROAT: Lips are without lesions. Oral mucosa is moist and without lesions. Tongue is normal in shape, size, and color and without lesions RESPIRATORY: Breathing is even & unlabored, BS CTAB CARDIAC: RRR, no murmur,no extra heart sounds, no edema GI: Abdomen soft, normal BS, no masses, no tenderness NEUROLOGICAL: There is no tremor. Speech is clear. Alert to self, disoriented to time and place. PSYCHIATRIC:  Affect and behavior are appropriate  Labs reviewed: Recent Labs    03/01/20 0827 03/02/20 0453 03/04/20 0319 03/12/20 0000  NA 139 140 139 145  K 3.1* 3.9 3.6 4.3  CL 106 108 109 108  CO2 21* 20*  20* 21  GLUCOSE 102* 109* 111*  --   BUN 17 17 21 16   CREATININE 1.19 1.19 1.20 1.0  CALCIUM 8.3* 9.0 8.7* 9.1   Recent Labs    02/29/20 0303 03/01/20 0827 03/02/20 0453 03/12/20 0000  AST 25 33 39 35  ALT 22 28 38 64*  ALKPHOS 47 54 67 100  BILITOT 0.5 0.8 1.3*  --   PROT 5.3* 5.7* 7.3  --   ALBUMIN 2.4* 2.7* 3.2* 3.3*   Recent Labs    03/01/20 0827 03/02/20 0453 03/04/20 0319 03/12/20 0000 03/17/20 0000 04/09/20 0000 05/26/20 0000  WBC 8.0 12.5* 12.0*   < > 11.3 7.3 7.3  NEUTROABS 5.3 9.1* 8.3*   < > 8 3 3   HGB 16.5 19.2* 17.8*   < > 15.5 15.1 15.1  HCT 47.5 55.1* 51.2   < > 46 46 46  MCV 92.1 91.7 90.8  --   --   --   --   PLT 244 330 353   < > 275 322 322   < > = values  in this interval not displayed.   Lab Results  Component Value Date   TSH 0.631 02/29/2020   Lab Results  Component Value Date   HGBA1C 5.6 02/26/2020    Assessment/Plan  1. ACP (advance care planning) -  Remains to be full code -  Discussed  Medications, vital signs and weights  2. Essential hypertension -  BPs stable, continue Metoprolol tartrate, Lisinopril, Clonidine and Amlodipine  3. History of CVA (cerebrovascular accident) -  Stable, continue ASA  4. Vascular dementia with behavior disturbance (HCC) -  Continue supportive care  5. Psychosis  -  Reported to have occasional refusal to take shower and sleeps on facility furniture, continue Quetiapine -  Followed by psych NP    Family/ staff Communication:   Discussed plan of care with IDT.  Labs/tests ordered: None  Goals of care:   Long-term care   Kenard Gower, DNP, MSN, FNP-BC [facility] [phone] (Monday-Friday 8:00 a.m. - 5:00 p.m.) [phone] (after hours)

## 2020-11-27 DIAGNOSIS — G9341 Metabolic encephalopathy: Secondary | ICD-10-CM | POA: Diagnosis not present

## 2020-11-27 DIAGNOSIS — Z125 Encounter for screening for malignant neoplasm of prostate: Secondary | ICD-10-CM | POA: Diagnosis not present

## 2020-11-27 DIAGNOSIS — F22 Delusional disorders: Secondary | ICD-10-CM | POA: Diagnosis not present

## 2020-11-27 DIAGNOSIS — D649 Anemia, unspecified: Secondary | ICD-10-CM | POA: Diagnosis not present

## 2020-11-27 LAB — BASIC METABOLIC PANEL
BUN: 22 — AB (ref 4–21)
CO2: 19 (ref 13–22)
Chloride: 106 (ref 99–108)
Creatinine: 1.2 (ref 0.6–1.3)
Glucose: 91
Potassium: 4.7 (ref 3.4–5.3)
Sodium: 139 (ref 137–147)

## 2020-11-27 LAB — CBC AND DIFFERENTIAL
HCT: 46 (ref 41–53)
Hemoglobin: 16 (ref 13.5–17.5)
Neutrophils Absolute: 3.5
Platelets: 246 (ref 150–399)
WBC: 7.2

## 2020-11-27 LAB — TSH: TSH: 1.2 (ref 0.41–5.90)

## 2020-11-27 LAB — CBC: RBC: 4.93 (ref 3.87–5.11)

## 2020-11-27 LAB — COMPREHENSIVE METABOLIC PANEL
Albumin: 4.1 (ref 3.5–5.0)
Calcium: 9.2 (ref 8.7–10.7)
GFR calc Af Amer: 68.95
GFR calc non Af Amer: 59.49
Globulin: 2.9

## 2020-11-27 LAB — HEPATIC FUNCTION PANEL
ALT: 9 — AB (ref 10–40)
AST: 14 (ref 14–40)
Alkaline Phosphatase: 75 (ref 25–125)
Bilirubin, Total: 0.6

## 2020-12-03 DIAGNOSIS — Z9114 Patient's other noncompliance with medication regimen: Secondary | ICD-10-CM | POA: Diagnosis not present

## 2020-12-03 DIAGNOSIS — F4324 Adjustment disorder with disturbance of conduct: Secondary | ICD-10-CM | POA: Diagnosis not present

## 2020-12-03 DIAGNOSIS — F29 Unspecified psychosis not due to a substance or known physiological condition: Secondary | ICD-10-CM | POA: Diagnosis not present

## 2020-12-22 ENCOUNTER — Non-Acute Institutional Stay (SKILLED_NURSING_FACILITY): Payer: Medicare Other | Admitting: Adult Health

## 2020-12-22 ENCOUNTER — Encounter: Payer: Self-pay | Admitting: Adult Health

## 2020-12-22 DIAGNOSIS — Z8673 Personal history of transient ischemic attack (TIA), and cerebral infarction without residual deficits: Secondary | ICD-10-CM | POA: Diagnosis not present

## 2020-12-22 DIAGNOSIS — I10 Essential (primary) hypertension: Secondary | ICD-10-CM

## 2020-12-22 DIAGNOSIS — F0390 Unspecified dementia without behavioral disturbance: Secondary | ICD-10-CM

## 2020-12-22 DIAGNOSIS — F29 Unspecified psychosis not due to a substance or known physiological condition: Secondary | ICD-10-CM | POA: Diagnosis not present

## 2020-12-22 NOTE — Progress Notes (Signed)
Location:  [facility] Room Number: 118-A Place of Service:  SNF (31) Provider:  Kenard Gower, DNP, FNP-BC  Patient Care Team: Pecola Lawless, MD as PCP - General (Internal Medicine)  Extended Emergency Contact Information Primary Emergency Contact: Pascal Lux of [place] Mobile Phone: [phone] Relation: Son Secondary Emergency Contact: Gillermina Hu Home Phone: [phone] Relation: Relative  Code Status:  FULL CODE  Goals of care: Advanced Directive information Advanced Directives 11/21/2020  Does Patient Have a Medical Advance Directive? No  Would patient like information on creating a medical advance directive? No - Patient declined     Chief Complaint  Patient presents with  . Medical Management of Chronic Issues    Routine [facility] visit    HPI:  Pt is a 75 y.o. male seen today for medical management of chronic diseases.  He is a long-term care resident of [facility].  He has a PMH of hypertension, hyperlipidemia, GERD, cerebral infarction and COVID-19 pneumonia. SBPs ranging from 120-173.  He takes amlodipine 10 mg daily, clonidine 0.1 mg/day patch weekly, metoprolol tartrate 25 mg twice a day and Lotensin 5 mg daily for hypertension.  No reported agitation.  He takes quetiapine 12.5 mg twice a day for psychosis.   Past Medical History:  Diagnosis Date  . Acute respiratory failure with hypoxia (HCC)   . Cognitive communication deficit   . Constipation   . Dysphagia   . Essential hypertension   . GERD without esophagitis   . Gout   . History of COVID-19   . Metabolic encephalopathy   . Muscle weakness   . Other sequelae of cerebral infarction   . Pneumonia due to COVID-19 virus 2019   No past surgical history on file.  Allergies  Allergen Reactions  . Atorvastatin Itching and Rash    Outpatient Encounter Medications as of 12/22/2020  Medication Sig  . bisacodyl  (DULCOLAX) 10 MG suppository Place 10 mg rectally daily as needed for moderate constipation.  . SODIUM PHOSPHATES RE Place 1 each rectally daily as needed.  Marland Kitchen acetaminophen (TYLENOL) 325 MG tablet Take 2 tablets (650 mg total) by mouth every 6 (six) hours as needed for mild pain or headache (fever >/= 101).  Marland Kitchen albuterol (VENTOLIN HFA) 108 (90 Base) MCG/ACT inhaler Inhale 2 puffs into the lungs every 4 (four) hours as needed for wheezing or shortness of breath.  Marland Kitchen amLODipine (NORVASC) 10 MG tablet Take 10 mg by mouth daily.  Marland Kitchen aspirin EC 81 MG tablet Take 1 tablet (81 mg total) by mouth daily.  . benazepril (LOTENSIN) 5 MG tablet Take 5 mg by mouth daily.  . cloNIDine (CATAPRES - DOSED IN MG/24 HR) 0.1 mg/24hr patch Place 0.1 mg onto the skin once a week.  . magnesium hydroxide (MILK OF MAGNESIA) 400 MG/5ML suspension Take 30 mLs by mouth daily as needed for mild constipation.  . metoprolol tartrate (LOPRESSOR) 25 MG tablet Take 25 mg by mouth 2 (two) times daily.  . Nutritional Supplement LIQD Take 120 mLs by mouth in the morning and at bedtime. MedPass  . Nutritional Supplement LIQD Take 120 mLs by mouth 2 (two) times daily. MedPass  . ondansetron (ZOFRAN) 4 MG tablet Take 1 tablet (4 mg total) by mouth every 6 (six) hours as needed for nausea.  . polyethylene glycol (MIRALAX / GLYCOLAX) 17 g packet Take 17 g by mouth daily as needed for mild constipation.  . QUEtiapine (SEROQUEL) 25 MG tablet  Take 12.5 mg by mouth 2 (two) times daily.   No facility-administered encounter medications on file as of 12/22/2020.    Review of Systems  GENERAL: No change in appetite, no fatigue, no weight changes, no fever, chills or weakness MOUTH and THROAT: Denies oral discomfort, gingival pain or bleeding RESPIRATORY: no cough, SOB, DOE, wheezing, hemoptysis CARDIAC: No chest pain, edema or palpitations GI: No abdominal pain, diarrhea, constipation, heart burn, nausea or vomiting GU: Denies dysuria,  frequency, hematuria, incontinence, or discharge NEUROLOGICAL: Denies dizziness, syncope, numbness, or headache PSYCHIATRIC: Denies feelings of depression or anxiety. No report of hallucinations, insomnia, paranoia, or agitation    Immunization History  Administered Date(s) Administered  . Influenza, High Dose Seasonal PF 08/14/2014  . Moderna Sars-Covid-2 Vaccination 05/20/2020, 06/16/2020  . Pneumococcal Polysaccharide-23 03/04/2020   Pertinent  Health Maintenance Due  Topic Date Due  . COLONOSCOPY (Pts 45-91yrs Insurance coverage will need to be confirmed)  01/13/2021 (Originally 07/26/1992)  . INFLUENZA VACCINE  02/12/2021 (Originally 06/15/2020)  . PNA vac Low Risk Adult (2 of 2 - PCV13) 03/04/2021    Vitals:   12/22/20 1514  Weight: 157 lb 3.2 oz (71.3 kg)  Height: 5\' 8"  (1.727 m)   Body mass index is 23.9 kg/m.  Physical Exam  GENERAL APPEARANCE: Well nourished. In no acute distress. Normal body habitus SKIN:  Skin is warm and dry.  MOUTH and THROAT: Lips are without lesions. Oral mucosa is moist and without lesions. Tongue is normal in shape, size, and color and without lesions RESPIRATORY: Breathing is even & unlabored, BS CTAB CARDIAC: RRR, no murmur,no extra heart sounds, no edema GI: Abdomen soft, normal BS, no masses, no tenderness EXTREMITIES:  Able to move X 4 extremities NEUROLOGICAL: There is no tremor. Speech is clear. Alert to self, disoriented to time and place. PSYCHIATRIC:  Affect and behavior are appropriate  Labs reviewed: Recent Labs    03/01/20 0827 03/02/20 0453 03/04/20 0319 03/12/20 0000 11/27/20 0000  NA 139 140 139 145 139  K 3.1* 3.9 3.6 4.3 4.7  CL 106 108 109 108 106  CO2 21* 20* 20* 21 19  GLUCOSE 102* 109* 111*  --   --   BUN 17 17 21 16  22*  CREATININE 1.19 1.19 1.20 1.0 1.2  CALCIUM 8.3* 9.0 8.7* 9.1 9.2   Recent Labs    02/29/20 0303 03/01/20 0827 03/02/20 0453 03/12/20 0000 11/27/20 0000  AST 25 33 39 35 14  ALT 22  28 38 64* 9*  ALKPHOS 47 54 67 100 75  BILITOT 0.5 0.8 1.3*  --   --   PROT 5.3* 5.7* 7.3  --   --   ALBUMIN 2.4* 2.7* 3.2* 3.3* 4.1   Recent Labs    03/01/20 0827 03/02/20 0453 03/04/20 0319 03/12/20 0000 04/09/20 0000 05/26/20 0000 11/27/20 0000  WBC 8.0 12.5* 12.0*   < > 7.3 7.3 7.2  NEUTROABS 5.3 9.1* 8.3*   < > 3 3 3.50  HGB 16.5 19.2* 17.8*   < > 15.1 15.1 16.0  HCT 47.5 55.1* 51.2   < > 46 46 46  MCV 92.1 91.7 90.8  --   --   --   --   PLT 244 330 353   < > 322 322 246   < > = values in this interval not displayed.   Lab Results  Component Value Date   TSH 1.20 11/27/2020   Lab Results  Component Value Date   HGBA1C 5.6  02/26/2020   Assessment/Plan  1. Uncontrolled hypertension -  BPs elevated -  Will increase Lotensin from 5 mg daily to 10 mg daily -Continue metoprolol tartrate 25 mg twice a day, amlodipine 10 mg 1 tab daily and clonidine 0.1 mg/day patch 1 patch weekly  2. History of CVA (cerebrovascular accident) -    Stable, continue aspirin EC 81 mg daily  3. Psychosis, unspecified psychosis type (HCC) -    Mood is a stable, continue quetiapine 12.5 mg twice a day  4. Dementia with behavioral disturbance, unspecified dementia type (HCC) -   Stable, continue supportive care and fall precautions      Family/ staff Communication: Discussed plan of care with resident and charge nurse.  Labs/tests ordered: None  Goals of care:   Long-term care   Kenard Gower, DNP, MSN, FNP-BC [facility] [phone] (Monday-Friday 8:00 a.m. - 5:00 p.m.) [phone] (after hours)

## 2021-01-15 ENCOUNTER — Non-Acute Institutional Stay (SKILLED_NURSING_FACILITY): Payer: Medicare Other | Admitting: Adult Health

## 2021-01-15 ENCOUNTER — Encounter: Payer: Self-pay | Admitting: Adult Health

## 2021-01-15 DIAGNOSIS — F29 Unspecified psychosis not due to a substance or known physiological condition: Secondary | ICD-10-CM | POA: Diagnosis not present

## 2021-01-15 DIAGNOSIS — Z8673 Personal history of transient ischemic attack (TIA), and cerebral infarction without residual deficits: Secondary | ICD-10-CM | POA: Diagnosis not present

## 2021-01-15 DIAGNOSIS — F0151 Vascular dementia with behavioral disturbance: Secondary | ICD-10-CM

## 2021-01-15 DIAGNOSIS — I10 Essential (primary) hypertension: Secondary | ICD-10-CM | POA: Diagnosis not present

## 2021-01-15 DIAGNOSIS — F01518 Vascular dementia, unspecified severity, with other behavioral disturbance: Secondary | ICD-10-CM

## 2021-01-15 NOTE — Progress Notes (Signed)
Location:  [facility] Room Number: 118 A Place of Service:  SNF (31) Provider:  Kenard Gower, DNP, FNP-BC  Patient Care Team: Pecola Lawless, MD as PCP - General (Internal Medicine)  Extended Emergency Contact Information Primary Emergency Contact: Pascal Lux of [place] Mobile Phone: [phone] Relation: Son Secondary Emergency Contact: Gillermina Hu Home Phone: [phone] Relation: Relative  Code Status:  Full Code  Goals of care: Advanced Directive information Advanced Directives 11/21/2020  Does Patient Have a Medical Advance Directive? No  Would patient like information on creating a medical advance directive? No - Patient declined     Chief Complaint  Patient presents with  . Medical Management of Chronic Issues    HPI:  Pt is a 75 y.o. male seen today for medical management of chronic diseases.  He has a PMH of hypertension, hyperlipidemia, GERD, cerebral infarction and COVID-19 pneumonia. SBPs ranging from 112 to 138. Benazepril dosage was increased from 5 mg to 10 mg daily last month due to elevated BPs. He, also,  Takes metoprolol tartrate 25 mg twice a day, clonidine 0.1 mg/day patch weekly and amlodipine 10 mg daily for hypertension.  No reported agitation. He takes quetiapine for moderate 12.5 mg twice a day for psychosis.    Past Medical History:  Diagnosis Date  . Acute respiratory failure with hypoxia (HCC)   . Cognitive communication deficit   . Constipation   . Dysphagia   . Essential hypertension   . GERD without esophagitis   . Gout   . History of COVID-19   . Metabolic encephalopathy   . Muscle weakness   . Other sequelae of cerebral infarction   . Pneumonia due to COVID-19 virus 2019   No past surgical history on file.  Allergies  Allergen Reactions  . Atorvastatin Itching and Rash    Outpatient Encounter Medications as of 01/15/2021  Medication Sig  . acetaminophen (TYLENOL) 325 MG  tablet Take 2 tablets (650 mg total) by mouth every 6 (six) hours as needed for mild pain or headache (fever >/= 101).  Marland Kitchen albuterol (VENTOLIN HFA) 108 (90 Base) MCG/ACT inhaler Inhale 2 puffs into the lungs every 4 (four) hours as needed for wheezing or shortness of breath.  Marland Kitchen amLODipine (NORVASC) 10 MG tablet Take 10 mg by mouth daily.  Marland Kitchen aspirin EC 81 MG tablet Take 1 tablet (81 mg total) by mouth daily.  . benazepril (LOTENSIN) 5 MG tablet Take 5 mg by mouth daily.  . bisacodyl (DULCOLAX) 10 MG suppository Place 10 mg rectally daily as needed for moderate constipation.  . cloNIDine (CATAPRES - DOSED IN MG/24 HR) 0.1 mg/24hr patch Place 0.1 mg onto the skin once a week.  . magnesium hydroxide (MILK OF MAGNESIA) 400 MG/5ML suspension Take 30 mLs by mouth daily as needed for mild constipation.  . metoprolol tartrate (LOPRESSOR) 25 MG tablet Take 25 mg by mouth 2 (two) times daily.  . Nutritional Supplement LIQD Take 120 mLs by mouth 2 (two) times daily. MedPass  . ondansetron (ZOFRAN) 4 MG tablet Take 1 tablet (4 mg total) by mouth every 6 (six) hours as needed for nausea.  . polyethylene glycol (MIRALAX / GLYCOLAX) 17 g packet Take 17 g by mouth daily as needed for mild constipation.  . QUEtiapine (SEROQUEL) 25 MG tablet Take 12.5 mg by mouth 2 (two) times daily.  . SODIUM PHOSPHATES RE Place 1 each rectally daily as needed.   No facility-administered encounter medications on file  as of 01/15/2021.    Review of Systems  GENERAL: No change in appetite, no fatigue, no weight changes, no fever, chills or weakness MOUTH and THROAT: Denies oral discomfort, gingival pain or bleeding RESPIRATORY: no cough, SOB, DOE, wheezing, hemoptysis CARDIAC: No chest pain, edema or palpitations GI: No abdominal pain, diarrhea, constipation, heart burn, nausea or vomiting GU: Denies dysuria, frequency, hematuria or discharge NEUROLOGICAL: Denies dizziness, syncope, numbness, or headache PSYCHIATRIC: Denies  feelings of depression or anxiety. No report of hallucinations, insomnia, paranoia, or agitation   Immunization History  Administered Date(s) Administered  . Influenza, High Dose Seasonal PF 08/14/2014  . Moderna Sars-Covid-2 Vaccination 05/20/2020, 06/16/2020  . Pneumococcal Polysaccharide-23 03/04/2020   Pertinent  Health Maintenance Due  Topic Date Due  . COLONOSCOPY (Pts 45-34yrs Insurance coverage will need to be confirmed)  Never done  . INFLUENZA VACCINE  02/12/2021 (Originally 06/15/2020)  . PNA vac Low Risk Adult (2 of 2 - PCV13) 03/04/2021   No flowsheet data found.   Vitals:   01/15/21 1630  BP: 138/88  Pulse: 78  Resp: 15  Temp: 98.1 F (36.7 C)  Weight: 166 lb 3.2 oz (75.4 kg)  Height: 5\' 8"  (1.727 m)   Body mass index is 25.27 kg/m.  Physical Exam  GENERAL APPEARANCE: Well nourished. In no acute distress. Normal body habitus SKIN:  Skin is warm and dry.  MOUTH and THROAT: Lips are without lesions. Oral mucosa is moist and without lesions.  RESPIRATORY: Breathing is even & unlabored, BS CTAB CARDIAC: RRR, no murmur,no extra heart sounds, no edema GI: Abdomen soft, normal BS, no masses, no tenderness EXTREMITIES:  Able to move X 4 extremities NEUROLOGICAL: There is no tremor. Speech is clear. Alert to self, disoriented to time and place. PSYCHIATRIC:  Affect and behavior are appropriate  Labs reviewed: Recent Labs    03/01/20 0827 03/02/20 0453 03/04/20 0319 03/12/20 0000 11/27/20 0000  NA 139 140 139 145 139  K 3.1* 3.9 3.6 4.3 4.7  CL 106 108 109 108 106  CO2 21* 20* 20* 21 19  GLUCOSE 102* 109* 111*  --   --   BUN 17 17 21 16  22*  CREATININE 1.19 1.19 1.20 1.0 1.2  CALCIUM 8.3* 9.0 8.7* 9.1 9.2   Recent Labs    02/29/20 0303 03/01/20 0827 03/02/20 0453 03/12/20 0000 11/27/20 0000  AST 25 33 39 35 14  ALT 22 28 38 64* 9*  ALKPHOS 47 54 67 100 75  BILITOT 0.5 0.8 1.3*  --   --   PROT 5.3* 5.7* 7.3  --   --   ALBUMIN 2.4* 2.7* 3.2*  3.3* 4.1   Recent Labs    03/01/20 0827 03/02/20 0453 03/04/20 0319 03/12/20 0000 04/09/20 0000 05/26/20 0000 11/27/20 0000  WBC 8.0 12.5* 12.0*   < > 7.3 7.3 7.2  NEUTROABS 5.3 9.1* 8.3*   < > 3 3 3.50  HGB 16.5 19.2* 17.8*   < > 15.1 15.1 16.0  HCT 47.5 55.1* 51.2   < > 46 46 46  MCV 92.1 91.7 90.8  --   --   --   --   PLT 244 330 353   < > 322 322 246   < > = values in this interval not displayed.   Lab Results  Component Value Date   TSH 1.20 11/27/2020   Lab Results  Component Value Date   HGBA1C 5.6 02/26/2020    Assessment/Plan  1. History of CVA (  cerebrovascular accident) -  Stable, continue ASA  2. Essential hypertension -  BPs improved, continue hydralazine, amlodipine, benazepril and clonidine  3. Psychosis, unspecified psychosis type (HCC) -  Mood is stable, continue quetiapine -   Followed by psych NP  4. Vascular dementia with behavior disturbance (HCC) -    BIMS score 2/15, ranging in sever cognitive impairment -   Continue supportive care    Family/ staff Communication: Discussed plan of care with resident and charge nurse.  Labs/tests ordered: None  Goals of care:   Long-term care   Kenard Gower, DNP, MSN, FNP-BC [facility] and [facility] [phone] (Monday-Friday 8:00 a.m. - 5:00 p.m.) [phone] (after hours)

## 2021-02-16 ENCOUNTER — Encounter: Payer: Self-pay | Admitting: Adult Health

## 2021-02-16 ENCOUNTER — Non-Acute Institutional Stay (SKILLED_NURSING_FACILITY): Payer: Medicare Other | Admitting: Adult Health

## 2021-02-16 DIAGNOSIS — F0151 Vascular dementia with behavioral disturbance: Secondary | ICD-10-CM | POA: Diagnosis not present

## 2021-02-16 DIAGNOSIS — F29 Unspecified psychosis not due to a substance or known physiological condition: Secondary | ICD-10-CM | POA: Diagnosis not present

## 2021-02-16 DIAGNOSIS — I10 Essential (primary) hypertension: Secondary | ICD-10-CM | POA: Diagnosis not present

## 2021-02-16 DIAGNOSIS — F01518 Vascular dementia, unspecified severity, with other behavioral disturbance: Secondary | ICD-10-CM

## 2021-02-16 DIAGNOSIS — Z8673 Personal history of transient ischemic attack (TIA), and cerebral infarction without residual deficits: Secondary | ICD-10-CM

## 2021-02-16 NOTE — Progress Notes (Signed)
Location:  [facility] Room Number: 118/A Place of Service:  SNF (31) Provider:  Kenard Gower, DNP, FNP-BC  Patient Care Team: Pecola Lawless, MD as PCP - General (Internal Medicine)  Extended Emergency Contact Information Primary Emergency Contact: Pascal Lux of [place] Mobile Phone: [phone] Relation: Son Secondary Emergency Contact: Gillermina Hu Home Phone: [phone] Relation: Relative  Code Status: Full Code   Goals of care: Advanced Directive information Advanced Directives 02/16/2021  Does Patient Have a Medical Advance Directive? No  Does patient want to make changes to medical advance directive? No - Patient declined  Would patient like information on creating a medical advance directive? -     Chief Complaint  Patient presents with  . Medical Management of Chronic Issues    Routine Visit of Medical Management     HPI:  Pt is a 75 y.o. male seen today for medical management of chronic diseases. He is a long-term care resident of [facility]. He has a PMH of hypertension, hyperlipidemia, GERD, cerebral infarction and COVID-19 pneumonia. SBPs ranging from 110 to 140. She takes Amlodipine 10 mg daily, Benazepril 10 mg daily and Clonidine 0.1 mg/day patch weekly for hypertension. He has episodes of refusing medications. 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   Past Medical History:  Diagnosis Date  . Acute respiratory failure with hypoxia (HCC)   . Cognitive communication deficit   . Constipation   . Dysphagia   . Essential hypertension   . GERD without esophagitis   . Gout   . History of COVID-19   . Metabolic encephalopathy   . Muscle weakness   . Other sequelae of cerebral infarction   . Pneumonia due to COVID-19 virus 2019   History reviewed. No pertinent surgical history.  Allergies  Allergen Reactions  . Atorvastatin Itching and Rash    Outpatient Encounter  Medications as of 02/16/2021  Medication Sig  . acetaminophen (TYLENOL) 325 MG tablet Take 2 tablets (650 mg total) by mouth every 6 (six) hours as needed for mild pain or headache (fever >/= 101).  Marland Kitchen albuterol (VENTOLIN HFA) 108 (90 Base) MCG/ACT inhaler Inhale 2 puffs into the lungs every 4 (four) hours as needed for wheezing or shortness of breath.  Marland Kitchen amLODipine (NORVASC) 10 MG tablet Take 10 mg by mouth daily.  Marland Kitchen aspirin EC 81 MG tablet Take 1 tablet (81 mg total) by mouth daily.  . benazepril (LOTENSIN) 5 MG tablet Take 5 mg by mouth daily.  . bisacodyl (DULCOLAX) 10 MG suppository Place 10 mg rectally daily as needed for moderate constipation.  . cloNIDine (CATAPRES - DOSED IN MG/24 HR) 0.1 mg/24hr patch Place 0.1 mg onto the skin once a week.  . magnesium hydroxide (MILK OF MAGNESIA) 400 MG/5ML suspension Take 30 mLs by mouth daily as needed for mild constipation.  . metoprolol tartrate (LOPRESSOR) 25 MG tablet Take 25 mg by mouth 2 (two) times daily.  . NON FORMULARY Magic Cup: Give one daily to help stabilize weight  . Nutritional Supplement LIQD Take 120 mLs by mouth 2 (two) times daily. MedPass  . ondansetron (ZOFRAN) 4 MG tablet Take 1 tablet (4 mg total) by mouth every 6 (six) hours as needed for nausea.  . polyethylene glycol (MIRALAX / GLYCOLAX) 17 g packet Take 17 g by mouth daily as needed for mild constipation.  . QUEtiapine (SEROQUEL) 25 MG tablet Take 12.5 mg by mouth 2 (two) times daily.  . [  DISCONTINUED] SODIUM PHOSPHATES RE Place 1 each rectally daily as needed.   No facility-administered encounter medications on file as of 02/16/2021.    Review of Systems  GENERAL: No change in appetite, no fatigue, no weight changes, no fever, chills or weakness MOUTH and THROAT: Denies oral discomfort, gingival pain or bleeding, pain from teeth or hoarseness   RESPIRATORY: no cough, SOB, DOE, wheezing, hemoptysis CARDIAC: No chest pain, edema or palpitations GI: No abdominal pain,  diarrhea, constipation, heart burn, nausea or vomiting NEUROLOGICAL: Denies dizziness, syncope, numbness, or headache PSYCHIATRIC: Denies feelings of depression or anxiety. No report of hallucinations, insomnia, paranoia, or agitation    Immunization History  Administered Date(s) Administered  . Influenza, High Dose Seasonal PF 08/14/2014  . Moderna Sars-Covid-2 Vaccination 05/20/2020, 06/16/2020  . Pneumococcal Polysaccharide-23 03/04/2020  . Unspecified SARS-COV-2 Vaccination 01/20/2021   Pertinent  Health Maintenance Due  Topic Date Due  . COLONOSCOPY (Pts 45-69yrs Insurance coverage will need to be confirmed)  Never done  . PNA vac Low Risk Adult (2 of 2 - PCV13) 03/04/2021  . INFLUENZA VACCINE  06/15/2021   No flowsheet data found.   Vitals:   02/16/21 1137  BP: 128/82  Pulse: 65  Resp: 20  Temp: 97.8 F (36.6 C)  SpO2: 97%  Weight: 165 lb 12.8 oz (75.2 kg)  Height: 5\' 8"  (1.727 m)   Body mass index is 25.21 kg/m.  Physical Exam  GENERAL APPEARANCE: Well nourished. In no acute distress. Normal body habitus SKIN:  Skin is warm and dry.  MOUTH and THROAT: Lips are without lesions. Oral mucosa is moist and without lesions. Tongue is normal in shape, size, and color and without lesions RESPIRATORY: Breathing is even & unlabored, BS CTAB CARDIAC: RRR, no murmur,no extra heart sounds, no edema GI: Abdomen soft, normal BS, no masses, no tenderness NEUROLOGICAL: There is no tremor. Speech is clear. Alert to self, disoriented to time and place. PSYCHIATRIC:  Affect and behavior are appropriate  Labs reviewed: Recent Labs    03/01/20 0827 03/02/20 0453 03/04/20 0319 03/12/20 0000 11/27/20 0000  NA 139 140 139 145 139  K 3.1* 3.9 3.6 4.3 4.7  CL 106 108 109 108 106  CO2 21* 20* 20* 21 19  GLUCOSE 102* 109* 111*  --   --   BUN 17 17 21 16  22*  CREATININE 1.19 1.19 1.20 1.0 1.2  CALCIUM 8.3* 9.0 8.7* 9.1 9.2   Recent Labs    02/29/20 0303 03/01/20 0827  03/02/20 0453 03/12/20 0000 11/27/20 0000  AST 25 33 39 35 14  ALT 22 28 38 64* 9*  ALKPHOS 47 54 67 100 75  BILITOT 0.5 0.8 1.3*  --   --   PROT 5.3* 5.7* 7.3  --   --   ALBUMIN 2.4* 2.7* 3.2* 3.3* 4.1   Recent Labs    03/01/20 0827 03/02/20 0453 03/04/20 0319 03/12/20 0000 04/09/20 0000 05/26/20 0000 11/27/20 0000  WBC 8.0 12.5* 12.0*   < > 7.3 7.3 7.2  NEUTROABS 5.3 9.1* 8.3*   < > 3 3 3.50  HGB 16.5 19.2* 17.8*   < > 15.1 15.1 16.0  HCT 47.5 55.1* 51.2   < > 46 46 46  MCV 92.1 91.7 90.8  --   --   --   --   PLT 244 330 353   < > 322 322 246   < > = values in this interval not displayed.   Lab Results  Component  Value Date   TSH 1.20 11/27/2020   Lab Results  Component Value Date   HGBA1C 5.6 02/26/2020     Assessment/Plan  1. Essential hypertension -  BPs improved -   Continue benazepril, clonidine and amlodipine -    Monitor BPs  2. History of CVA (cerebrovascular accident) -  Stable, continue aspirin  3. Psychosis, unspecified psychosis type (HCC) -  has episodes of medication refusals -   Continue quetiapine -   Followed by psych NP  4. Vascular dementia with behavior disturbance (HCC) -   BIMS score 2/15, severe cognitive impairment -   Continue supportive care     Family/ staff Communication: Discussed plan of care with resident and charge nurse.  Labs/tests ordered: None  Goals of care:  Long-term care   Kenard Gower, DNP, MSN, FNP-BC [facility] [phone] (Monday-Friday 8:00 a.m. - 5:00 p.m.) [phone] (after hours)

## 2021-02-25 ENCOUNTER — Non-Acute Institutional Stay (SKILLED_NURSING_FACILITY): Payer: Medicare Other | Admitting: Adult Health

## 2021-02-25 DIAGNOSIS — I10 Essential (primary) hypertension: Secondary | ICD-10-CM

## 2021-02-25 DIAGNOSIS — Z7189 Other specified counseling: Secondary | ICD-10-CM

## 2021-02-25 DIAGNOSIS — F0390 Unspecified dementia without behavioral disturbance: Secondary | ICD-10-CM

## 2021-02-25 DIAGNOSIS — F29 Unspecified psychosis not due to a substance or known physiological condition: Secondary | ICD-10-CM | POA: Diagnosis not present

## 2021-02-25 NOTE — Progress Notes (Addendum)
Location:  [facility] Room Number: 118 A Place of Service:  SNF (31) Provider:  Kenard Gower, DNP, FNP-BC  Patient Care Team: Pecola Lawless, MD as PCP - General (Internal Medicine)  Extended Emergency Contact Information Primary Emergency Contact: Pascal Lux of [place] Mobile Phone: [phone] Relation: Son Secondary Emergency Contact: Gillermina Hu Home Phone: [phone] Relation: Relative  Code Status:  Full Code  Goals of care: Advanced Directive information Advanced Directives 02/16/2021  Does Patient Have a Medical Advance Directive? No  Does patient want to make changes to medical advance directive? No - Patient declined  Would patient like information on creating a medical advance directive? -     Chief Complaint  Patient presents with  . Acute Visit    Advance care planning    HPI:  Pt is a 75 y.o. male who had a care plan meeting today. He is a long-term care resident of [facility].  The meeting was attended by social worker, [employer], MDS, NP and Dietitian. Son, [name], was called via telephone conference but did not answer. Resident was invited but declined. He remains to be full code. Discussed medications, vital signs and weights. Staff reported that he has been refusing to change clothes and shower. He doesn't participate in facility activities. 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 He is followed up by psych NP. He received his Moderna COVID-19 booster. The meeting lasted for 20 minutes.   Past Medical History:  Diagnosis Date  . Acute respiratory failure with hypoxia (HCC)   . Cognitive communication deficit   . Constipation   . Dysphagia   . Essential hypertension   . GERD without esophagitis   . Gout   . History of COVID-19   . Metabolic encephalopathy   . Muscle weakness   . Other sequelae of cerebral infarction   . Pneumonia due to COVID-19  virus 2019   No past surgical history on file.  Allergies  Allergen Reactions  . Atorvastatin Itching and Rash    Outpatient Encounter Medications as of 02/25/2021  Medication Sig  . acetaminophen (TYLENOL) 325 MG tablet Take 2 tablets (650 mg total) by mouth every 6 (six) hours as needed for mild pain or headache (fever >/= 101).  Marland Kitchen albuterol (VENTOLIN HFA) 108 (90 Base) MCG/ACT inhaler Inhale 2 puffs into the lungs every 4 (four) hours as needed for wheezing or shortness of breath.  Marland Kitchen amLODipine (NORVASC) 10 MG tablet Take 10 mg by mouth daily.  Marland Kitchen aspirin EC 81 MG tablet Take 1 tablet (81 mg total) by mouth daily.  . benazepril (LOTENSIN) 5 MG tablet Take 5 mg by mouth daily.  . bisacodyl (DULCOLAX) 10 MG suppository Place 10 mg rectally daily as needed for moderate constipation.  . cloNIDine (CATAPRES - DOSED IN MG/24 HR) 0.1 mg/24hr patch Place 0.1 mg onto the skin once a week.  . magnesium hydroxide (MILK OF MAGNESIA) 400 MG/5ML suspension Take 30 mLs by mouth daily as needed for mild constipation.  . metoprolol tartrate (LOPRESSOR) 25 MG tablet Take 25 mg by mouth 2 (two) times daily.  . NON FORMULARY Magic Cup: Give one daily to help stabilize weight  . Nutritional Supplement LIQD Take 120 mLs by mouth 2 (two) times daily. MedPass  . ondansetron (ZOFRAN) 4 MG tablet Take 1 tablet (4 mg total) by mouth every 6 (six) hours as needed for nausea.  . polyethylene glycol (MIRALAX / GLYCOLAX) 17  g packet Take 17 g by mouth daily as needed for mild constipation.  . QUEtiapine (SEROQUEL) 25 MG tablet Take 12.5 mg by mouth 2 (two) times daily.   No facility-administered encounter medications on file as of 02/25/2021.    Review of Systems  GENERAL: No change in appetite, no fatigue, no weight changes, no fever, chills or weakness MOUTH and THROAT: Denies oral discomfort, gingival pain or bleeding RESPIRATORY: no cough, SOB, DOE, wheezing, hemoptysis CARDIAC: No chest pain, edema or  palpitations GI: No abdominal pain, diarrhea, constipation, heart burn, nausea or vomiting GU: Denies dysuria, frequency, hematuria or discharge NEUROLOGICAL: Denies dizziness, syncope, numbness, or headache PSYCHIATRIC: Denies feelings of depression or anxiety. No report of hallucinations, insomnia, paranoia, or agitation   Immunization History  Administered Date(s) Administered  . Influenza, High Dose Seasonal PF 08/14/2014  . Moderna Sars-Covid-2 Vaccination 05/20/2020, 06/16/2020  . Pneumococcal Polysaccharide-23 03/04/2020  . Unspecified SARS-COV-2 Vaccination 01/20/2021   Pertinent  Health Maintenance Due  Topic Date Due  . COLONOSCOPY (Pts 45-76yrs Insurance coverage will need to be confirmed)  Never done  . PNA vac Low Risk Adult (2 of 2 - PCV13) 03/04/2021  . INFLUENZA VACCINE  06/15/2021   No flowsheet data found.   Vitals:   02/25/21 1000  BP: 129/77  Pulse: 75  Resp: 17  Weight: 165 lb 12.8 oz (75.2 kg)  Height: 5\' 8"  (1.727 m)   Body mass index is 25.21 kg/m.  Physical Exam  GENERAL APPEARANCE: Well nourished. In no acute distress. Normal body habitus SKIN:  Skin is warm and dry.  MOUTH and THROAT: Lips are without lesions. Oral mucosa is moist and without lesions.  RESPIRATORY: Breathing is even & unlabored, BS CTAB CARDIAC: RRR, no murmur,no extra heart sounds, no edema GI: Abdomen soft, normal BS, no masses, no tenderness EXTREMITIES:  Able to move X 4 extremities. NEUROLOGICAL: There is no tremor. Speech is clear. Alert to self, disoriented to time and place.  PSYCHIATRIC:  Affect and behavior are appropriate  Labs reviewed: Recent Labs    03/01/20 0827 03/02/20 0453 03/04/20 0319 03/12/20 0000 11/27/20 0000  NA 139 140 139 145 139  K 3.1* 3.9 3.6 4.3 4.7  CL 106 108 109 108 106  CO2 21* 20* 20* 21 19  GLUCOSE 102* 109* 111*  --   --   BUN 17 17 21 16  22*  CREATININE 1.19 1.19 1.20 1.0 1.2  CALCIUM 8.3* 9.0 8.7* 9.1 9.2   Recent Labs     02/29/20 0303 03/01/20 0827 03/02/20 0453 03/12/20 0000 11/27/20 0000  AST 25 33 39 35 14  ALT 22 28 38 64* 9*  ALKPHOS 47 54 67 100 75  BILITOT 0.5 0.8 1.3*  --   --   PROT 5.3* 5.7* 7.3  --   --   ALBUMIN 2.4* 2.7* 3.2* 3.3* 4.1   Recent Labs    03/01/20 0827 03/02/20 0453 03/04/20 0319 03/12/20 0000 04/09/20 0000 05/26/20 0000 11/27/20 0000  WBC 8.0 12.5* 12.0*   < > 7.3 7.3 7.2  NEUTROABS 5.3 9.1* 8.3*   < > 3 3 3.50  HGB 16.5 19.2* 17.8*   < > 15.1 15.1 16.0  HCT 47.5 55.1* 51.2   < > 46 46 46  MCV 92.1 91.7 90.8  --   --   --   --   PLT 244 330 353   < > 322 322 246   < > = values in this interval not  displayed.   Lab Results  Component Value Date   TSH 1.20 11/27/2020   Lab Results  Component Value Date   HGBA1C 5.6 02/26/2020    Assessment/Plan  1. ACP (advance care planning) -Remains to be full code -Discussed medications, vital signs and weights  2. Essential hypertension - BPs ranging from 123-140, stable -Continue metoprolol, benazepril, clonidine and amlodipine  3. Psychosis, unspecified psychosis type (HCC) -Has episodes of not wanting to change clothes/shower -Continue Quetiapine 12.5 mg BID -  Refer for psych consult  4. Dementia with behavioral disturbance, unspecified dementia type (HCC) - BIMS score 4/15, ranging in severe cognitive impairment -Continue supportive care    Family/ staff Communication: Discussed plan of care with IDT.  Labs/tests ordered:  None  Goals of care:   Long-term care   Kenard Gower, DNP, MSN, FNP-BC [facility] [phone] (Monday-Friday 8:00 a.m. - 5:00 p.m.) [phone] (after hours)

## 2021-02-26 ENCOUNTER — Encounter: Payer: Self-pay | Admitting: Adult Health

## 2021-03-12 ENCOUNTER — Encounter: Payer: Self-pay | Admitting: Internal Medicine

## 2021-03-12 ENCOUNTER — Non-Acute Institutional Stay (SKILLED_NURSING_FACILITY): Payer: Medicare Other | Admitting: Internal Medicine

## 2021-03-12 DIAGNOSIS — J9601 Acute respiratory failure with hypoxia: Secondary | ICD-10-CM | POA: Diagnosis not present

## 2021-03-12 DIAGNOSIS — F0150 Vascular dementia without behavioral disturbance: Secondary | ICD-10-CM | POA: Diagnosis not present

## 2021-03-12 DIAGNOSIS — I10 Essential (primary) hypertension: Secondary | ICD-10-CM

## 2021-03-12 NOTE — Patient Instructions (Signed)
See assessment and plan under each diagnosis in the problem list and acutely for this visit 

## 2021-03-12 NOTE — Progress Notes (Signed)
NURSING HOME LOCATION:  [facility]  ROOM NUMBER:  118 A  CODE STATUS:  Full Code  PCP:  Douglass Rivers MD  This is a nursing facility follow up visit of chronic medical diagnoses & to document compliance with Regulation 483.30 (c) in The Long Term Care Survey Manual Phase 2 which mandates caregiver visit ( visits can alternate among physician, PA or NP as per statutes) within 10 days of 30 days / 60 days/ 90 days post admission to SNF date    Interim medical record and care since last SNF visit was updated with review of diagnostic studies and change in clinical status since last visit were documented.  HPI: The patient has been a permanent resident of this facility since discharge from the hospital 03/04/2020.  He was hospitalized with SARS coronavirus 2 positivity with hypoxia and encephalopathy.  Imaging revealed right lower lobe pneumonia.  The acute encephalopathy was superimposed on reported progressive memory issues over the prior several months.  Course was complicated by AKI and hypokalemia.   Other medical diagnoses include gout, GERD, & essential hypertension. Labs are current and there are no clinically significant abnormalities.  Review of systems:  Dementia invalidated responses.When asked how he was doing the response was "not too good, not too bad".  His responses to complete review of systems was a monosyllabic "no".  Constitutional: No fever, significant weight change, fatigue  Eyes: No redness, discharge, pain, vision change ENT/mouth: No nasal congestion,  purulent discharge, earache, change in hearing, sore throat  Cardiovascular: No chest pain, palpitations, paroxysmal nocturnal dyspnea, claudication, edema  Respiratory: No cough, sputum production, hemoptysis, DOE, significant snoring, apnea   Gastrointestinal: No heartburn, dysphagia, abdominal pain, nausea /vomiting, rectal bleeding, melena, change in bowels Genitourinary: No dysuria,  hematuria, pyuria, incontinence, nocturia Musculoskeletal: No joint stiffness, joint swelling, weakness, pain Dermatologic: No rash, pruritus, change in appearance of skin Neurologic: No dizziness, headache, syncope, seizures, numbness, tingling Psychiatric: No significant anxiety, depression, insomnia, anorexia Endocrine: No change in hair/skin/nails, excessive thirst, excessive hunger, excessive urination  Hematologic/lymphatic: No significant bruising, lymphadenopathy, abnormal bleeding Allergy/immunology: No itchy/watery eyes, significant sneezing, urticaria, angioedema  Physical exam: He was initially in a wheelchair but moved from the wheelchair to his bed after I took him into his room.  He was somewhat unsteady standing up and gait was wide and unbalanced but he was able to transfer without help.  Hair is disheveled and he is unshaven.  Facies tend to be blank.  He has intermittent alternating exotropic. He is edentulous.  There is slight asymmetry to his mouth; the right corner sags slightly.  There is slight increase in second heart sound.  There is a slight bronchovesicular quality to his breath sounds but no other abnormal auscultatory findings.  Slight clubbing the nailbeds is present.  Strength opposition is fair.  He is wearing an antiwandering bracelet on the R ankle.  General appearance: Adequately nourished; no acute distress, increased work of breathing is present.   Lymphatic: No lymphadenopathy about the head, neck, axilla. Eyes: No conjunctival inflammation or lid edema is present. There is no scleral icterus. Ears:  External ear exam shows no significant lesions or deformities.   Nose:  External nasal examination shows no deformity or inflammation. Nasal mucosa are pink and moist without lesions, exudates Neck:  No thyromegaly, masses, tenderness noted.    Heart:  Normal rate and regular rhythm. S1 normal without gallop, murmur, click, rub .  Lungs:  without  wheezes, rhonchi,  rales, rubs. Abdomen: Bowel sounds are normal. Abdomen is soft and nontender with no organomegaly, hernias, masses. GU: Deferred  Extremities:  No cyanosis, clubbing, edema  Neurologic exam :Balance, Rhomberg, finger to nose testing could not be completed due to clinical state Skin: Warm & dry w/o tenting. No significant lesions or rash.  See summary under each active problem in the Problem List with associated updated therapeutic plan

## 2021-03-12 NOTE — Assessment & Plan Note (Addendum)
BP controlled; no change in antihypertensive medications  

## 2021-03-12 NOTE — Assessment & Plan Note (Signed)
He can provide no meaningful history; answers to review of systems were monosyllabic "no".  He has an antiwandering bracelet at the ankle.

## 2021-03-12 NOTE — Assessment & Plan Note (Addendum)
Clinically resolved; O2 sats were 97% on room air.  There is slight bronchovesicular quality to breath sounds but no clinically significant findings on pulmonary exam.

## 2021-03-30 ENCOUNTER — Non-Acute Institutional Stay (SKILLED_NURSING_FACILITY): Payer: Medicare Other | Admitting: Adult Health

## 2021-03-30 ENCOUNTER — Encounter: Payer: Self-pay | Admitting: Adult Health

## 2021-03-30 DIAGNOSIS — F01518 Vascular dementia, unspecified severity, with other behavioral disturbance: Secondary | ICD-10-CM

## 2021-03-30 DIAGNOSIS — F0151 Vascular dementia with behavioral disturbance: Secondary | ICD-10-CM | POA: Diagnosis not present

## 2021-03-30 DIAGNOSIS — F29 Unspecified psychosis not due to a substance or known physiological condition: Secondary | ICD-10-CM

## 2021-03-30 DIAGNOSIS — I10 Essential (primary) hypertension: Secondary | ICD-10-CM

## 2021-03-30 NOTE — Progress Notes (Signed)
Location:  [facility] Room Number: 118 A Place of Service:  SNF (31) Provider:  Kenard Gower, DNP, FNP-BC  Patient Care Team: Pecola Lawless, MD as PCP - General (Internal Medicine)  Extended Emergency Contact Information Primary Emergency Contact: Pascal Lux of [employer] Mobile Phone: [phone] Relation: Son Secondary Emergency Contact: Gillermina Hu Home Phone: [phone] Relation: Relative  Code Status:  FULL CODE  Goals of care: Advanced Directive information Advanced Directives 03/30/2021  Does Patient Have a Medical Advance Directive? No  Does patient want to make changes to medical advance directive? No - Patient declined  Would patient like information on creating a medical advance directive? -     Chief Complaint  Patient presents with  . Medical Management of Chronic Issues    Routine follow up visit.     HPI:  Pt is a 75 y.o. male seen today for medical management of chronic diseases. He is a long-term care resident of [facility]. He has good oral intake and med pass supplementation was discontinued. He refused stool occult blood tests. Latest BIMS score 4/15, ranging in severe cognitive impairment.  SBPs ranging from 107 to 120, with outlier 160. He takes metoprolol tartrate 25 mg twice a day, benazepril 10 mg 1 tab daily, clonidine 0.1 mg/day patch weekly and amlodipine 10 mg daily for hypertension.   Past Medical History:  Diagnosis Date  . Acute respiratory failure with hypoxia (HCC)   . Cognitive communication deficit   . Constipation   . Dysphagia   . Essential hypertension   . GERD without esophagitis   . Gout   . History of COVID-19   . Metabolic encephalopathy   . Muscle weakness   . Other sequelae of cerebral infarction   . Pneumonia due to COVID-19 virus 2019   History reviewed. No pertinent surgical history.  Allergies  Allergen Reactions  . Atorvastatin  Itching and Rash    Outpatient Encounter Medications as of 03/30/2021  Medication Sig  . acetaminophen (TYLENOL) 325 MG tablet Take 2 tablets (650 mg total) by mouth every 6 (six) hours as needed for mild pain or headache (fever >/= 101).  Marland Kitchen albuterol (VENTOLIN HFA) 108 (90 Base) MCG/ACT inhaler Inhale 2 puffs into the lungs every 4 (four) hours as needed for wheezing or shortness of breath.  Marland Kitchen amLODipine (NORVASC) 10 MG tablet Take 10 mg by mouth daily.  Marland Kitchen aspirin EC 81 MG tablet Take 1 tablet (81 mg total) by mouth daily.  . benazepril (LOTENSIN) 10 MG tablet Take 10 mg by mouth daily.  . bisacodyl (DULCOLAX) 10 MG suppository Place 10 mg rectally daily as needed for moderate constipation.  . cloNIDine (CATAPRES - DOSED IN MG/24 HR) 0.1 mg/24hr patch Place 0.1 mg onto the skin once a week.  . magnesium hydroxide (MILK OF MAGNESIA) 400 MG/5ML suspension Take 30 mLs by mouth daily as needed for mild constipation.  . metoprolol tartrate (LOPRESSOR) 25 MG tablet Take 25 mg by mouth 2 (two) times daily.  . ondansetron (ZOFRAN) 4 MG tablet Take 1 tablet (4 mg total) by mouth every 6 (six) hours as needed for nausea.  . polyethylene glycol (MIRALAX / GLYCOLAX) 17 g packet Take 17 g by mouth daily as needed for mild constipation.  . QUEtiapine (SEROQUEL) 25 MG tablet Take 12.5 mg by mouth 2 (two) times daily.  . [DISCONTINUED] NON FORMULARY Magic Cup: Give one daily to help stabilize weight  . [DISCONTINUED] Nutritional  Supplement LIQD Take 120 mLs by mouth 2 (two) times daily. MedPass   No facility-administered encounter medications on file as of 03/30/2021.    Review of Systems  GENERAL: No change in appetite, no fatigue, no weight changes, no fever, chills or weakness MOUTH and THROAT: Denies oral discomfort, gingival pain or bleeding RESPIRATORY: no cough, SOB, DOE, wheezing, hemoptysis CARDIAC: No chest pain, edema or palpitations GI: No abdominal pain, diarrhea, constipation, heart burn,  nausea or vomiting GU: Denies dysuria, frequency, hematuria or discharge NEUROLOGICAL: Denies dizziness, syncope, numbness, or headache PSYCHIATRIC: Denies feelings of depression or anxiety. No report of hallucinations, insomnia, paranoia, or agitation   Immunization History  Administered Date(s) Administered  . Influenza, High Dose Seasonal PF 08/14/2014  . Moderna Sars-Covid-2 Vaccination 05/20/2020, 06/16/2020  . Pneumococcal Polysaccharide-23 03/04/2020  . Unspecified SARS-COV-2 Vaccination 01/20/2021   Pertinent  Health Maintenance Due  Topic Date Due  . COLONOSCOPY (Pts 45-45yrs Insurance coverage will need to be confirmed)  Never done  . PNA vac Low Risk Adult (2 of 2 - PCV13) 03/04/2021  . INFLUENZA VACCINE  06/15/2021   No flowsheet data found.   Vitals:   03/30/21 1101  BP: 135/81  Pulse: 75  Resp: 17  Temp: 97.9 F (36.6 C)  Weight: 165 lb 12.8 oz (75.2 kg)  Height: 5\' 8"  (1.727 m)   Body mass index is 25.21 kg/m.  Physical Exam  GENERAL APPEARANCE: Well nourished. In no acute distress. Normal body habitus SKIN:  Skin is warm and dry.  MOUTH and THROAT: Lips are without lesions. Oral mucosa is moist and without lesions.  RESPIRATORY: Breathing is even & unlabored, BS CTAB CARDIAC: RRR, no murmur,no extra heart sounds, no edema GI: Abdomen soft, normal BS, no masses, no tenderness EXTREMITIES: Able to move x4 extremities NEUROLOGICAL: There is no tremor. Speech is clear.  Alert to self, disoriented to time and place. 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 Essential hypertension -    BPs stable, continue clonidine patch, benazepril, amlodipine and metoprolol tartrate  3. Psychosis, unspecified psychosis type (HCC) - has refusing labs, episodes of refusing medications -   Continue Quetiapine Fumarate -   Followed by psych NP    Family/ staff Communication: Discussed plan of care with resident and charge nurse.  Labs/tests ordered: None  Goals of care:   Long-term care   5/15, DNP, MSN, FNP-BC [facility] [phone] (Monday-Friday 8:00 a.m. - 5:00 p.m.) [phone] (after hours)

## 2021-04-30 ENCOUNTER — Non-Acute Institutional Stay (SKILLED_NURSING_FACILITY): Payer: Medicare Other | Admitting: Adult Health

## 2021-04-30 ENCOUNTER — Encounter: Payer: Self-pay | Admitting: Adult Health

## 2021-04-30 DIAGNOSIS — Z8673 Personal history of transient ischemic attack (TIA), and cerebral infarction without residual deficits: Secondary | ICD-10-CM

## 2021-04-30 DIAGNOSIS — I10 Essential (primary) hypertension: Secondary | ICD-10-CM | POA: Diagnosis not present

## 2021-04-30 DIAGNOSIS — F29 Unspecified psychosis not due to a substance or known physiological condition: Secondary | ICD-10-CM

## 2021-04-30 DIAGNOSIS — F0151 Vascular dementia with behavioral disturbance: Secondary | ICD-10-CM | POA: Diagnosis not present

## 2021-04-30 DIAGNOSIS — F01518 Vascular dementia, unspecified severity, with other behavioral disturbance: Secondary | ICD-10-CM

## 2021-04-30 NOTE — Progress Notes (Signed)
Location:  [facility] Room Number: 118-A Place of Service:  SNF (31) Provider:  Kenard Gower, DNP, FNP-BC  Patient Care Team: Pecola Lawless, MD as PCP - General (Internal Medicine)  Extended Emergency Contact Information Primary Emergency Contact: Pascal Lux of [place] Mobile Phone: [phone] Relation: Son Secondary Emergency Contact: Gillermina Hu Home Phone: [phone] Relation: Relative  Code Status:  Full Code   Goals of care: Advanced Directive information Advanced Directives 04/30/2021  Does Patient Have a Medical Advance Directive? No  Does patient want to make changes to medical advance directive? No - Patient declined  Would patient like information on creating a medical advance directive? -     Chief Complaint  Patient presents with   Medical Management of Chronic Issues    Routine visit    HPI:  Pt is a 75 y.o. male seen today for medical management of chronic diseases. He is a long-term care resident of [facility]. He has a PMH of hypertension, hyperlipidemia, GERD, cerebral infarction and COVID-19 pneumonia. SBPs ranging from 96 to 146. He takes metoprolol tartrate 25 mg twice, amlodipine 10 mg 1 tablet daily, benazepril 10 mg 1 tablet daily and clonidine 0.1 mg/daily patch apply topically to skin history for hypertension.  He does not participate in facility activities.  He takes Quetiapine 12.5 mg twice a day for psychosis.  Past Medical History:  Diagnosis Date   Acute respiratory failure with hypoxia (HCC)    Cognitive communication deficit    Constipation    Dysphagia    Essential hypertension    GERD without esophagitis    Gout    History of COVID-19    Metabolic encephalopathy    Muscle weakness    Other sequelae of cerebral infarction    Pneumonia due to COVID-19 virus 2019   History reviewed. No pertinent surgical history.  Allergies  Allergen Reactions    Atorvastatin Itching and Rash    Outpatient Encounter Medications as of 04/30/2021  Medication Sig   acetaminophen (TYLENOL) 325 MG tablet Take 2 tablets (650 mg total) by mouth every 6 (six) hours as needed for mild pain or headache (fever >/= 101).   albuterol (VENTOLIN HFA) 108 (90 Base) MCG/ACT inhaler Inhale 2 puffs into the lungs every 4 (four) hours as needed for wheezing or shortness of breath.   amLODipine (NORVASC) 10 MG tablet Take 10 mg by mouth daily.   [EXPIRED] amoxicillin (AMOXIL) 500 MG capsule Take 500 mg by mouth as directed. TAKE ONE CAPSULE THREE TIMES A DAY UNTIL GONE (10 DAYS) FOR DENTAL INFECTION   aspirin EC 81 MG tablet Take 1 tablet (81 mg total) by mouth daily.   benazepril (LOTENSIN) 10 MG tablet Take 10 mg by mouth daily.   bisacodyl (DULCOLAX) 10 MG suppository Place 10 mg rectally daily as needed for moderate constipation.   cloNIDine (CATAPRES - DOSED IN MG/24 HR) 0.1 mg/24hr patch Place 0.1 mg onto the skin once a week.   magnesium hydroxide (MILK OF MAGNESIA) 400 MG/5ML suspension Take 30 mLs by mouth daily as needed for mild constipation.   metoprolol tartrate (LOPRESSOR) 25 MG tablet Take 25 mg by mouth 2 (two) times daily.   ondansetron (ZOFRAN) 4 MG tablet Take 1 tablet (4 mg total) by mouth every 6 (six) hours as needed for nausea.   polyethylene glycol (MIRALAX / GLYCOLAX) 17 g packet Take 17 g by mouth daily as needed for mild constipation.   QUEtiapine (  SEROQUEL) 25 MG tablet Take 12.5 mg by mouth 2 (two) times daily.   UNABLE TO FIND Med Name: Magic Cup once daily to help stabilize weight   No facility-administered encounter medications on file as of 04/30/2021.    Review of Systems  Unable to obtain due to cognitive impairment.    Immunization History  Administered Date(s) Administered   Influenza, High Dose Seasonal PF 08/14/2014   Moderna SARS-COV2 Booster Vaccination 01/20/2021   Moderna Sars-Covid-2 Vaccination 05/20/2020, 06/16/2020    Pneumococcal Polysaccharide-23 03/04/2020   Unspecified SARS-COV-2 Vaccination 01/20/2021   Pertinent  Health Maintenance Due  Topic Date Due   COLONOSCOPY (Pts 45-70yrs Insurance coverage will need to be confirmed)  Never done   PNA vac Low Risk Adult (2 of 2 - PCV13) 03/04/2021   INFLUENZA VACCINE  06/15/2021   No flowsheet data found.   Vitals:   04/30/21 1000  BP: 107/70  Pulse: 74  Resp: 17  Temp: 97.9 F (36.6 C)  Weight: 165 lb 12.8 oz (75.2 kg)  Height: 5\' 8"  (1.727 m)   Body mass index is 25.21 kg/m.  Physical Exam  GENERAL APPEARANCE: Well nourished. In no acute distress. Normal body habitus SKIN:  Skin is warm and dry.  MOUTH and THROAT: Lips are without lesions. Oral mucosa is moist and without lesions.  RESPIRATORY: Breathing is even & unlabored, BS CTAB CARDIAC: RRR, no murmur,no extra heart sounds, no edema GI: Abdomen soft, normal BS, no masses, no tenderness EXTREMITIES:  Able to move X 4 extremities. NEUROLOGICAL: There is no tremor. Speech is clear. Alert to self, disoriented to time and place. PSYCHIATRIC:  Affect and behavior are appropriate  Labs reviewed: Recent Labs    11/27/20 0000  NA 139  K 4.7  CL 106  CO2 19  BUN 22*  CREATININE 1.2  CALCIUM 9.2   Recent Labs    11/27/20 0000  AST 14  ALT 9*  ALKPHOS 75  ALBUMIN 4.1   Recent Labs    05/26/20 0000 11/27/20 0000  WBC 7.3 7.2  NEUTROABS 3 3.50  HGB 15.1 16.0  HCT 46 46  PLT 322 246   Lab Results  Component Value Date   TSH 1.20 11/27/2020   Lab Results  Component Value Date   HGBA1C 5.6 02/26/2020   No results found for: CHOL, HDL, LDLCALC, LDLDIRECT, TRIG, CHOLHDL  Significant Diagnostic Results in last 30 days:  No results found.  Assessment/Plan  1. Essential hypertension -  stable, continue Clonidine, Benazepril, Amlodipine and metoprolol tartrate -  monitor BPs  2. History of CVA (cerebrovascular accident) -   stable, continue aspirin  3.  Psychosis, unspecified psychosis type (HCC) -  has occasional refusal of medications -  continue Quetiapine 12.5 mg twice a day -    followed by psych NP  4. Vascular dementia with behavior disturbance (HCC) -    BIMS score 4/15, ranging in severe cognitive impairment -    continue supportive care    Family/ staff Communication: Discussed plan of care with charge nurse.  Labs/tests ordered: None  Goals of care:   Long-term care   5/15, DNP, MSN, FNP-BC [facility] [phone] (Monday-Friday 8:00 a.m. - 5:00 p.m.) [phone] (after hours)

## 2021-05-09 ENCOUNTER — Encounter: Payer: Self-pay | Admitting: Adult Health

## 2021-05-28 ENCOUNTER — Non-Acute Institutional Stay (SKILLED_NURSING_FACILITY): Payer: Medicare Other | Admitting: Internal Medicine

## 2021-05-28 ENCOUNTER — Encounter: Payer: Self-pay | Admitting: Internal Medicine

## 2021-05-28 DIAGNOSIS — I10 Essential (primary) hypertension: Secondary | ICD-10-CM

## 2021-05-28 DIAGNOSIS — F0150 Vascular dementia without behavioral disturbance: Secondary | ICD-10-CM

## 2021-05-28 DIAGNOSIS — R2232 Localized swelling, mass and lump, left upper limb: Secondary | ICD-10-CM

## 2021-05-28 DIAGNOSIS — N1831 Chronic kidney disease, stage 3a: Secondary | ICD-10-CM

## 2021-05-28 DIAGNOSIS — N183 Chronic kidney disease, stage 3 unspecified: Secondary | ICD-10-CM | POA: Insufficient documentation

## 2021-05-28 DIAGNOSIS — H6121 Impacted cerumen, right ear: Secondary | ICD-10-CM

## 2021-05-28 NOTE — Assessment & Plan Note (Addendum)
Imaging will be pursued should this enlarge or be associated with pain or decreased ROM

## 2021-05-28 NOTE — Assessment & Plan Note (Addendum)
11/27/20 creat 1.2/GFR 59.49 CKD Stage 3a, almost Stage 2 Medication List reviewed If CKD progresses ACE-I will be weaned or discontinued even though low dose.

## 2021-05-28 NOTE — Patient Instructions (Signed)
See assessment and plan under each diagnosis in the problem list and acutely for this visit 

## 2021-05-28 NOTE — Progress Notes (Signed)
NURSING HOME LOCATION:  [facility] ROOM NUMBER:  118 A  CODE STATUS:  Full Code  PCP:  Pascal Lux MD  This is a nursing facility follow up visit of chronic medical diagnoses & to document compliance with Regulation 483.30 (c) in The [place] Manual Phase 2 which mandates caregiver visit ( visits can alternate among physician, PA or NP as per statutes) within 10 days of 30 days / 60 days/ 90 days post admission to SNF date    Interim medical record and care since last SNF visit was updated with review of diagnostic studies and change in clinical status since last visit were documented.  HPI: He is a permanent resident facility with medical diagnoses of essential hypertension, history of acute respiratory failure, GERD, history of gout, history of COVID-19 infection, and history of cerebral infarction complicated by dysphagia. There is no history of smoking. Most recent labs were performed 11/27/2020.  Creatinine was 1.2 and GFR 59.49 indicating CKD stage IIIa.  CBC was normal and TSH was therapeutic.  C-Met revealed no clinically significant abnormalities.  Review of systems: Dementia invalidated responses.  He could not provide the date or the name of the [place].  Answers to the majority of questions was a monosyllabic "no" except for possible decreased auditory acuity and tinnitus in the right ear for "the last month".  As noted he could not provide me with today's date.  Constitutional: No fever, significant weight change, fatigue  Eyes: No redness, discharge, pain, vision change ENT/mouth: No nasal congestion,  purulent discharge, sore throat  Cardiovascular: No chest pain, palpitations, paroxysmal nocturnal dyspnea, claudication, edema  Respiratory: No cough, sputum production, hemoptysis, DOE, significant snoring, apnea   Gastrointestinal: No heartburn, dysphagia, abdominal pain, nausea /vomiting, rectal bleeding, melena, change in  bowels Genitourinary: No dysuria, hematuria, pyuria, incontinence, nocturia Musculoskeletal: No joint stiffness, joint swelling, weakness, pain Dermatologic: No rash, pruritus, change in appearance of skin Neurologic: No dizziness, headache, syncope, seizures, numbness, tingling Psychiatric: No significant anxiety, depression, insomnia, anorexia Endocrine: No change in hair/skin/nails, excessive thirst, excessive hunger, excessive urination  Hematologic/lymphatic: No significant bruising, lymphadenopathy, abnormal bleeding Allergy/immunology: No itchy/watery eyes, significant sneezing, urticaria, angioedema  Physical exam:  Pertinent or positive findings: It was almost 11:30 am and he was still asleep.  He was easy to arouse.  Hair is disheveled.  He has a short beard and mustache which are unkempt. Increased cerumen in R canal with poor visualization of TM. There is excessive hirsutism in the nares.  The lower lids are puffy.  He is missing multiple maxillary and mandibular teeth .  Abdomen is protuberant.  Dorsalis pedis pulses are stronger than posterior tibial pulses.  There is a soft tissue mass over the lateral left elbow which is nontender.  There is no associated erythema, change in temperature, or crepitus. There is no epitrochlear lymphadenopathy or axillary lymph node adenopathy on that side.   The lower extremities are stronger than the upper extremities.  He has had antiwandering bracelet @ the left ankle.  General appearance: Adequately nourished; no acute distress, increased work of breathing is present.   Lymphatic: No lymphadenopathy about the head, neck, axilla. Eyes: No conjunctival inflammation or lid edema is present. There is no scleral icterus. Ears:  External ear exam shows no significant lesions or deformities.   Nose:  External nasal examination shows no deformity or inflammation. Nasal mucosa are pink and moist without lesions, exudates Oral exam:  There is  no  oropharyngeal erythema or exudate. Neck:  No thyromegaly, masses, tenderness noted.    Heart:  Normal rate and regular rhythm. S1 and S2 normal without gallop, murmur, click, rub .  Lungs: Chest clear to auscultation without wheezes, rhonchi, rales, rubs. Abdomen: Bowel sounds are normal. Abdomen is soft and nontender with no organomegaly, hernias, masses. GU: Deferred  Extremities:  No cyanosis, clubbing, edema  Neurologic exam :Balance, Rhomberg, finger to nose testing could not be completed due to clinical state Skin: Warm & dry w/o tenting. No significant  rash.  See summary under each active problem in the Problem List with associated updated therapeutic plan

## 2021-05-28 NOTE — Assessment & Plan Note (Signed)
BP controlled; no change in 4 antihypertensive medications unless there is progression of CKD

## 2021-05-28 NOTE — Assessment & Plan Note (Signed)
05/28/21 unable to provide date or name of POTUS

## 2021-06-18 ENCOUNTER — Non-Acute Institutional Stay (SKILLED_NURSING_FACILITY): Payer: Medicare Other | Admitting: Adult Health

## 2021-06-18 ENCOUNTER — Encounter: Payer: Self-pay | Admitting: Adult Health

## 2021-06-18 DIAGNOSIS — Z23 Encounter for immunization: Secondary | ICD-10-CM | POA: Diagnosis not present

## 2021-06-18 DIAGNOSIS — F01518 Vascular dementia, unspecified severity, with other behavioral disturbance: Secondary | ICD-10-CM

## 2021-06-18 DIAGNOSIS — F0151 Vascular dementia with behavioral disturbance: Secondary | ICD-10-CM | POA: Diagnosis not present

## 2021-06-18 DIAGNOSIS — N1831 Chronic kidney disease, stage 3a: Secondary | ICD-10-CM

## 2021-06-18 DIAGNOSIS — I10 Essential (primary) hypertension: Secondary | ICD-10-CM

## 2021-06-18 NOTE — Progress Notes (Signed)
Location:  [facility] Room Number: 118 A Place of Service:  SNF (31) Provider:  Kenard Gower, DNP, FNP-BC  Patient Care Team: Pecola Lawless, MD as PCP - General (Internal Medicine)  Extended Emergency Contact Information Primary Emergency Contact: Pascal Lux of [place] Mobile Phone: [phone] Relation: Son Secondary Emergency Contact: Gillermina Hu Home Phone: [phone] Relation: Relative  Code Status:  FULL CODE  Goals of care: Advanced Directive information Advanced Directives 06/18/2021  Does Patient Have a Medical Advance Directive? No  Does patient want to make changes to medical advance directive? -  Would patient like information on creating a medical advance directive? -     Chief Complaint  Patient presents with   Medical Management of Chronic Issues    Routine follow up visit.   Health Maintenance    zoster vaccine, pneumonia vaccine    HPI:  Pt is a 75 y.o. male seen today for medical management of chronic diseases.  He has a PMH of hypertension, COVID-19 pneumonia and hyperlipidemia. He was seen in the room today. He converses in "yes", "no" and "I don't know" words. Latest GFR 59.49 and creatinine 1.12. BPs ranging from 109 to 127. He takes clonidine 0.1 mg patch weekly, metoprolol tartrate 25 mg twice a day, benazepril 10 mg daily and amlodipine 10 mg daily for hypertension.   Past Medical History:  Diagnosis Date   Acute respiratory failure with hypoxia (HCC)    Cognitive communication deficit    Constipation    Dysphagia    Essential hypertension    GERD without esophagitis    Gout    History of COVID-19    Metabolic encephalopathy    Muscle weakness    Other sequelae of cerebral infarction    Pneumonia due to COVID-19 virus 2019   No past surgical history on file.  Allergies  Allergen Reactions   Atorvastatin Itching and Rash    Outpatient Encounter Medications as of 06/18/2021   Medication Sig   acetaminophen (TYLENOL) 325 MG tablet Take 2 tablets (650 mg total) by mouth every 6 (six) hours as needed for mild pain or headache (fever >/= 101).   albuterol (VENTOLIN HFA) 108 (90 Base) MCG/ACT inhaler Inhale 2 puffs into the lungs every 4 (four) hours as needed for wheezing or shortness of breath.   amLODipine (NORVASC) 10 MG tablet Take 10 mg by mouth daily.   aspirin EC 81 MG tablet Take 1 tablet (81 mg total) by mouth daily.   benazepril (LOTENSIN) 10 MG tablet Take 10 mg by mouth daily.   bisacodyl (DULCOLAX) 10 MG suppository Place 10 mg rectally daily as needed for moderate constipation.   cloNIDine (CATAPRES - DOSED IN MG/24 HR) 0.1 mg/24hr patch Place 0.1 mg onto the skin once a week.   magnesium hydroxide (MILK OF MAGNESIA) 400 MG/5ML suspension Take 30 mLs by mouth daily as needed for mild constipation.   metoprolol tartrate (LOPRESSOR) 25 MG tablet Take 25 mg by mouth 2 (two) times daily.   ondansetron (ZOFRAN) 4 MG tablet Take 1 tablet (4 mg total) by mouth every 6 (six) hours as needed for nausea.   polyethylene glycol (MIRALAX / GLYCOLAX) 17 g packet Take 17 g by mouth daily as needed for mild constipation.   QUEtiapine (SEROQUEL) 25 MG tablet Take 12.5 mg by mouth 2 (two) times daily.   Sodium Phosphates (RA SALINE ENEMA RE) Place 1 Dose rectally as needed.   [DISCONTINUED] UNABLE TO FIND  Med Name: Magic Cup once daily to help stabilize weight   No facility-administered encounter medications on file as of 06/18/2021.    Review of Systems  GENERAL: No change in appetite, no fatigue, no weight changes, no fever and chills  MOUTH and THROAT: Denies oral discomfort, gingival pain or bleeding, RESPIRATORY: no cough, SOB, DOE, wheezing, hemoptysis CARDIAC: No chest pain, edema or palpitations GI: No abdominal pain, diarrhea, constipation, heart burn, nausea or vomiting GU: Denies dysuria, frequency, hematuria or discharge NEUROLOGICAL: Denies dizziness,  syncope, numbness, or headache PSYCHIATRIC: Denies feelings of depression or anxiety. No report of hallucinations, insomnia, paranoia, or agitation   Immunization History  Administered Date(s) Administered   Influenza, High Dose Seasonal PF 08/14/2014   Moderna SARS-COV2 Booster Vaccination 01/20/2021   Moderna Sars-Covid-2 Vaccination 05/20/2020, 06/16/2020   Pneumococcal Polysaccharide-23 03/04/2020   Unspecified SARS-COV-2 Vaccination 01/20/2021   Pertinent  Health Maintenance Due  Topic Date Due   COLONOSCOPY (Pts 45-69yrs Insurance coverage will need to be confirmed)  Never done   PNA vac Low Risk Adult (2 of 2 - PCV13) 03/04/2021   INFLUENZA VACCINE  06/15/2021   No flowsheet data found.   Vitals:   06/18/21 1539  BP: 109/69  Pulse: 71  Resp: 17  Temp: 98.1 F (36.7 C)  Height: 5\' 8"  (1.727 m)   Body mass index is 25.21 kg/m.  Physical Exam  GENERAL APPEARANCE: Well nourished. In no acute distress. Normal body habitus SKIN:  Skin is warm and dry. T MOUTH and THROAT: Lips are without lesions. Oral mucosa is moist and without lesions.  RESPIRATORY: Breathing is even & unlabored, BS CTAB CARDIAC: RRR, no murmur,no extra heart sounds, no edema GI: Abdomen soft, normal BS, no masses, no tenderness EXTREMITIES: Able to move x4 extremities NEUROLOGICAL: There is no tremor. Speech is clear. Alert to self, disoriented to time and place. PSYCHIATRIC:  Affect and behavior are appropriate  Labs reviewed: Recent Labs    11/27/20 0000  NA 139  K 4.7  CL 106  CO2 19  BUN 22*  CREATININE 1.2  CALCIUM 9.2   Recent Labs    11/27/20 0000  AST 14  ALT 9*  ALKPHOS 75  ALBUMIN 4.1   Recent Labs    11/27/20 0000  WBC 7.2  NEUTROABS 3.50  HGB 16.0  HCT 46  PLT 246   Lab Results  Component Value Date   TSH 1.20 11/27/2020   Lab Results  Component Value Date   HGBA1C 5.6 02/26/2020   No results found for: CHOL, HDL, LDLCALC, LDLDIRECT, TRIG,  CHOLHDL  Significant Diagnostic Results in last 30 days:  No results found.  Assessment/Plan  1. Chronic kidney disease, stage 3a [facility]) Lab Results  Component Value Date   NA 139 11/27/2020   K 4.7 11/27/2020   CO2 19 11/27/2020   GLUCOSE 111 (H) 03/04/2020   BUN 22 (A) 11/27/2020   CREATININE 1.2 11/27/2020   CALCIUM 9.2 11/27/2020   GFRNONAA 59.49 11/27/2020   GFRAA 68.95 11/27/2020   -  stable  2. Essential hypertension -   BPs stable, continue amlodipine, benazepril, clonidine and metoprolol tartrate  3. Vascular dementia with behavior disturbance (HCC) -   BIMS score 3/15, ranging in severe cognitive impairment -    continue supportive care  4. Need for shingles vaccine -    Shingrix 0.5 mL vaccine IM x1  5. Need for vaccination against Streptococcus pneumoniae using pneumococcal conjugate vaccine 13 -   Prevnar 13  0.5 mL vaccine IM x1     Family/ staff Communication:   Discussed plan of care with resident and charge nurse.  Labs/tests ordered:   CMP, CBC, lipid panel, HgbA1C and stool occult blood x3  Goals of care:   Long-term care   Kenard Gower, DNP, MSN, FNP-BC [facility] [phone] (Monday-Friday 8:00 a.m. - 5:00 p.m.) [phone] (after hours)

## 2021-06-18 NOTE — Telephone Encounter (Signed)
This encounter was created in error - please disregard.

## 2021-07-02 LAB — CBC AND DIFFERENTIAL
HCT: 50 (ref 41–53)
Hemoglobin: 16.6 (ref 13.5–17.5)
Neutrophils Absolute: 4.5
Platelets: 269 (ref 150–399)
WBC: 9.1

## 2021-07-02 LAB — HEPATIC FUNCTION PANEL
ALT: 14 (ref 10–40)
AST: 20 (ref 14–40)
Bilirubin, Total: 0.5

## 2021-07-02 LAB — BASIC METABOLIC PANEL
BUN: 19 (ref 4–21)
CO2: 20 (ref 13–22)
Chloride: 104 (ref 99–108)
Creatinine: 1.3 (ref 0.6–1.3)
Glucose: 77
Potassium: 4.5 (ref 3.4–5.3)
Sodium: 143 (ref 137–147)

## 2021-07-02 LAB — COMPREHENSIVE METABOLIC PANEL
Albumin: 4.6 (ref 3.5–5.0)
Calcium: 9.9 (ref 8.7–10.7)
GFR calc Af Amer: 60.63
GFR calc non Af Amer: 52.31

## 2021-07-02 LAB — CBC: RBC: 5.29 — AB (ref 3.87–5.11)

## 2021-08-02 IMAGING — DX DG CHEST 1V PORT
1 series · 1 of 1 positions shown · non-contrast
Comparison: None.

CLINICAL DATA: Possible sepsis

EXAM:
PORTABLE CHEST 1 VIEW

[chest ap]
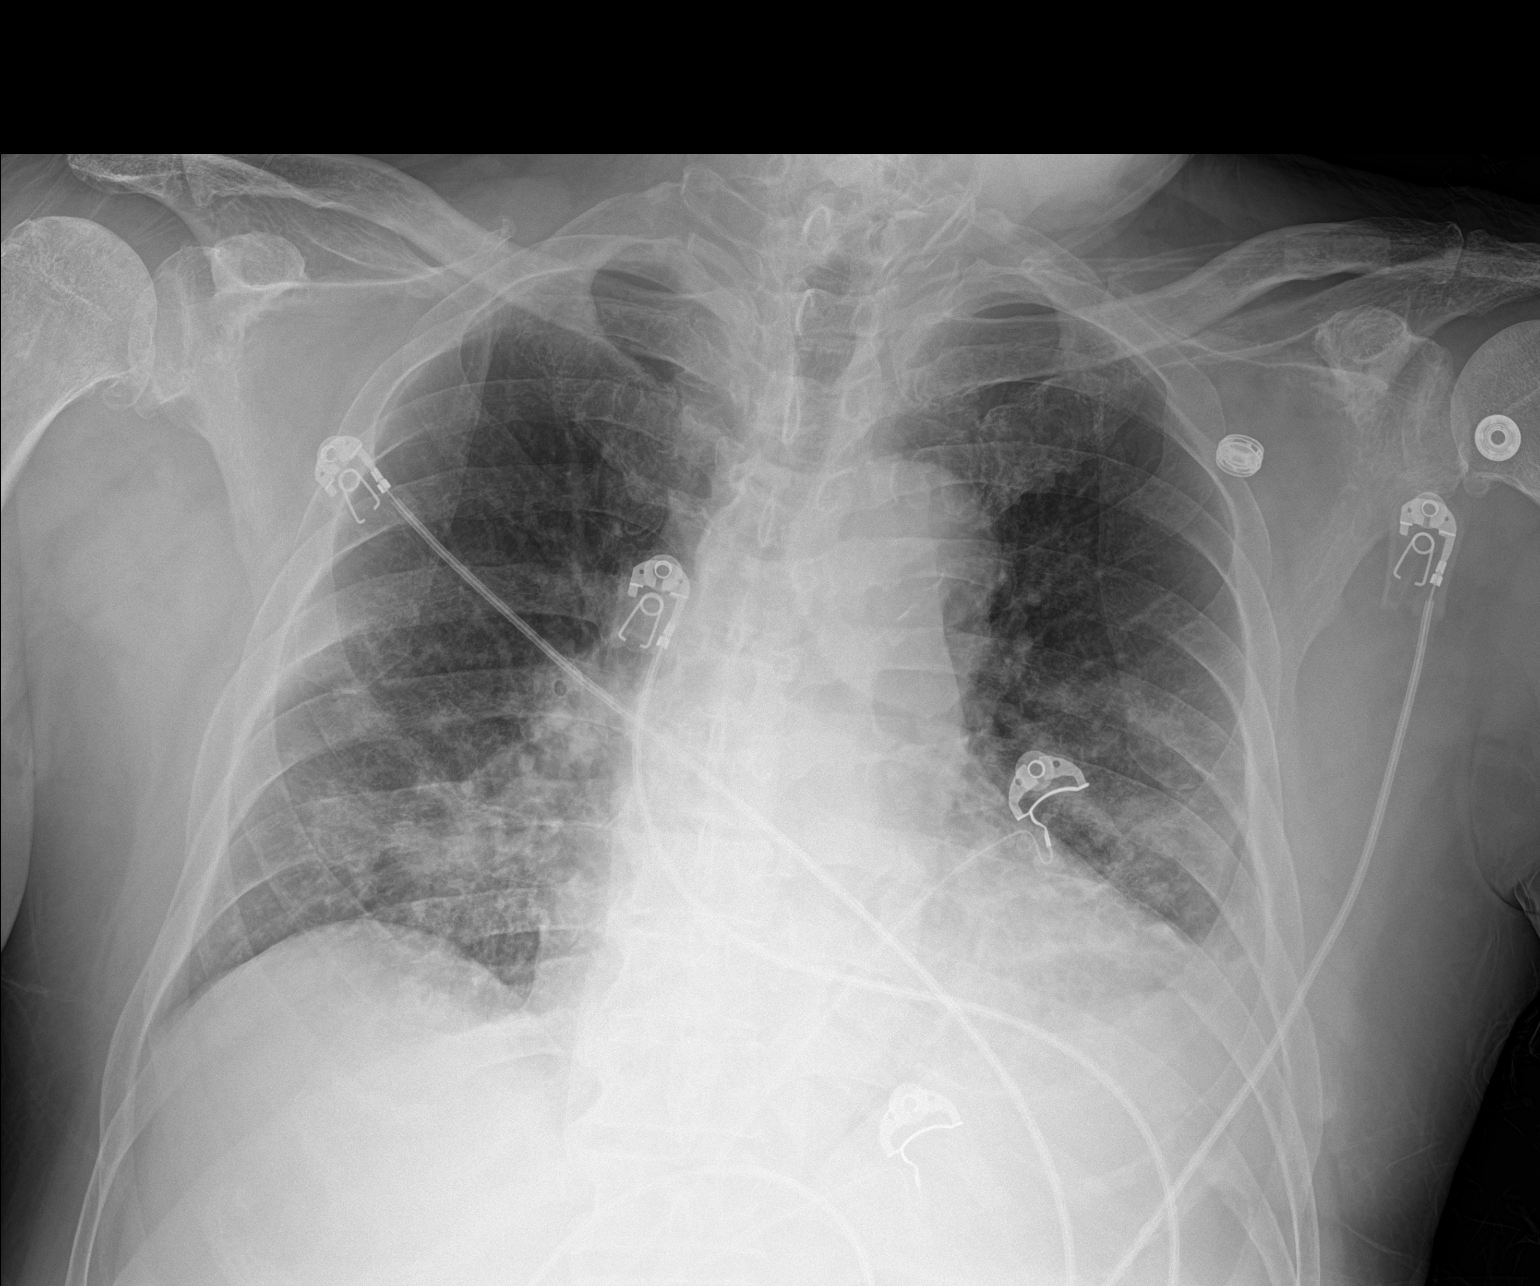

[1 of 1 positions shown; findings below may reference images not displayed]

FINDINGS: Increased opacity in the medial right lung base. Small left pleural
effusion. Mild cardiomegaly.
IMPRESSION: Increased opacity in the medial right lung base, which may indicate
developing consolidation. Small left pleural effusion.

## 2021-08-03 IMAGING — CT CT HEAD W/O CM
3 series · 16 of 47 positions shown, 19 images · non-contrast
Comparison: None.

CLINICAL DATA: Encephalopathy, weakness, [id number] positive

EXAM:
CT HEAD WITHOUT CONTRAST
TECHNIQUE: Contiguous axial images were obtained from the base of the skull
through the vertex without intravenous contrast.

[Series 3: head 5.0 h30s · axial · 0.46mm/px · z∈[-215,-65]mm · 10 of 36 slices shown, 13 images]
[im 3/36  brain]
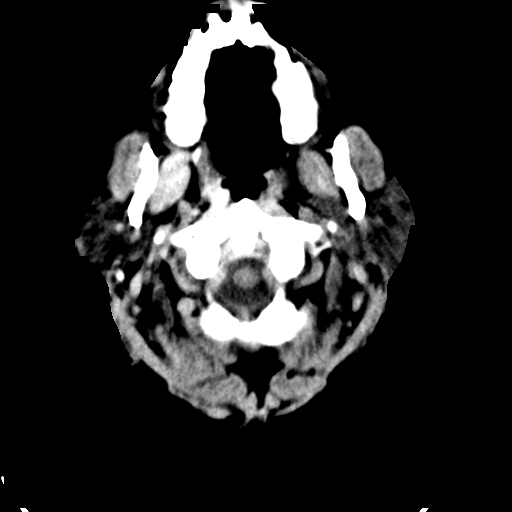
[im 3/36  bone]
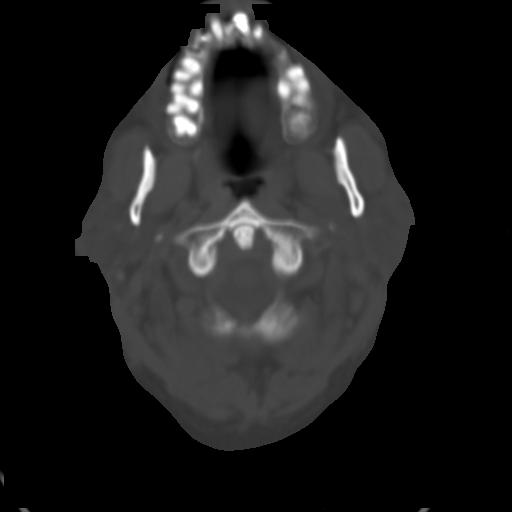
[im 7/36  brain]
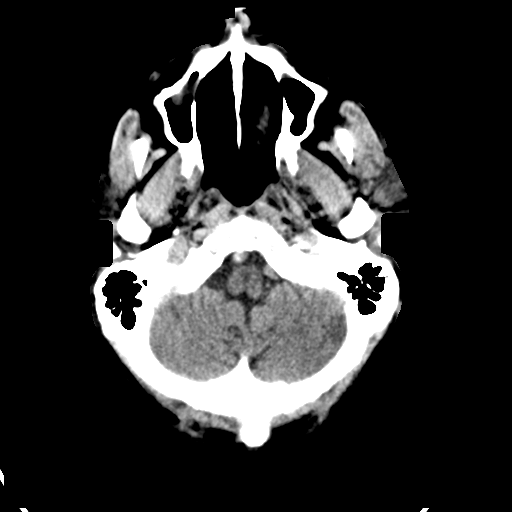
[im 10/36  brain]
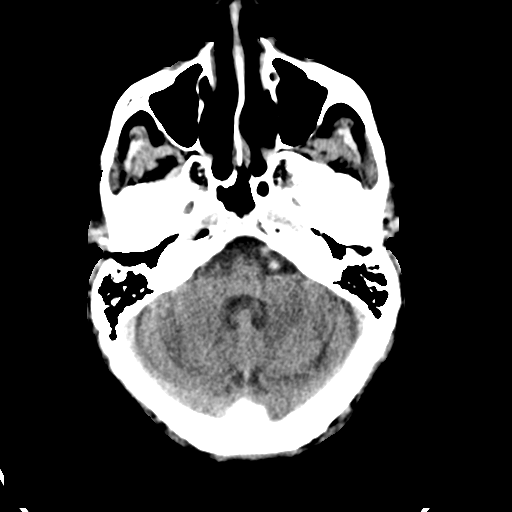
[im 13/36  brain]
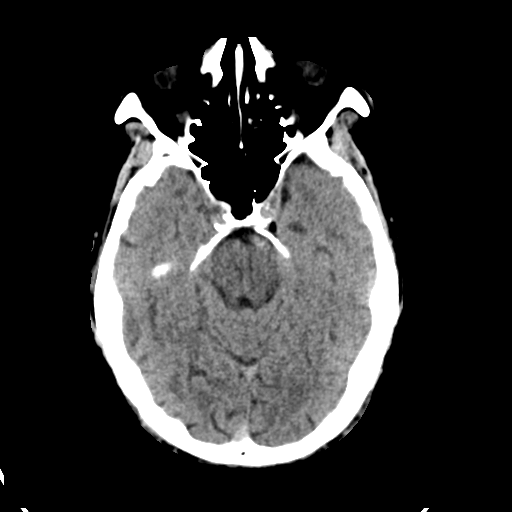
[im 16/36  brain]
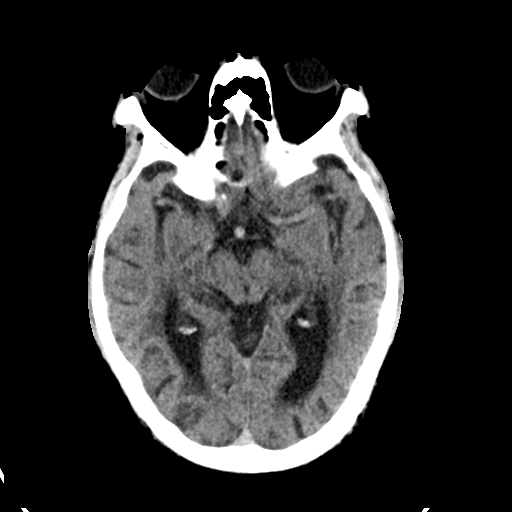
[im 16/36  bone]
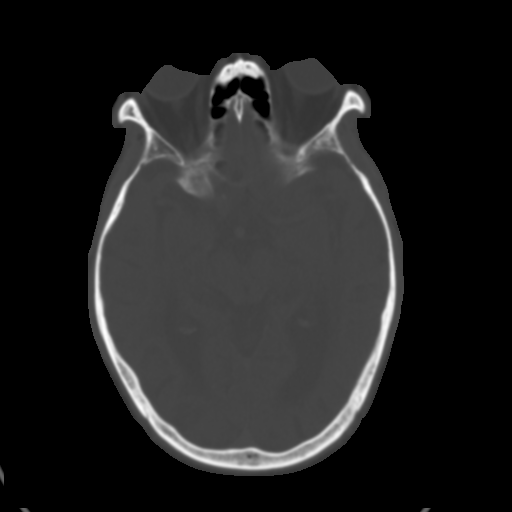
[im 20/36  brain]
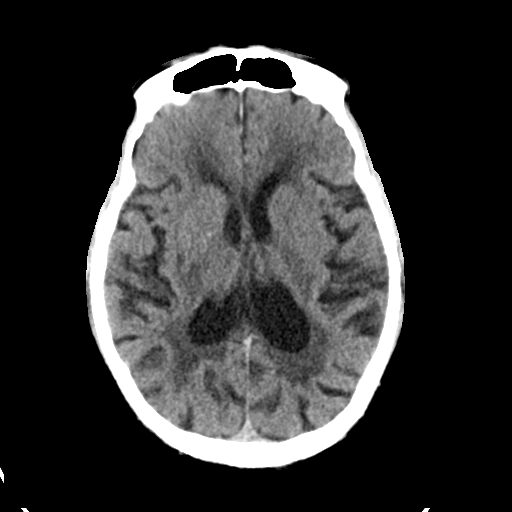
[im 23/36  brain]
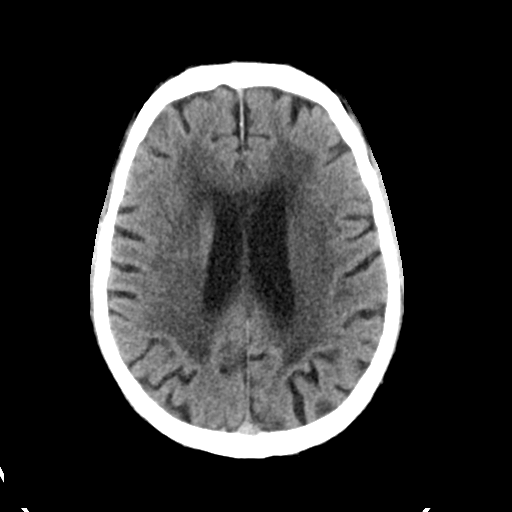
[im 27/36  brain]
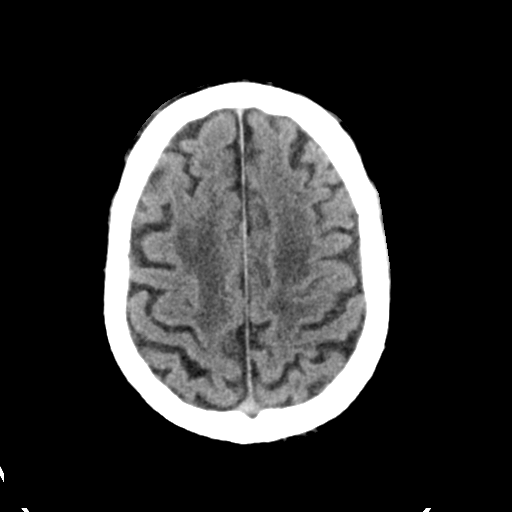
[im 29/36  brain]
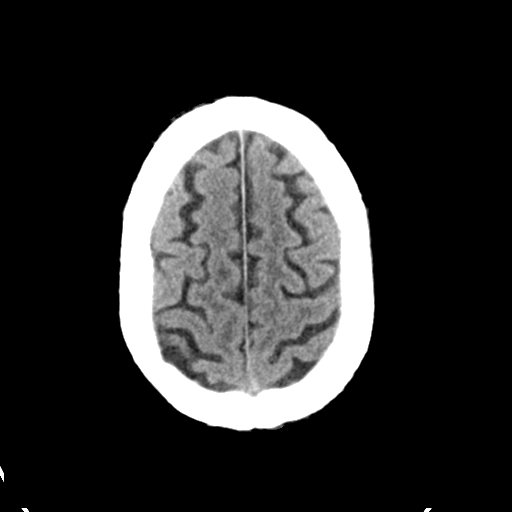
[im 29/36  bone]
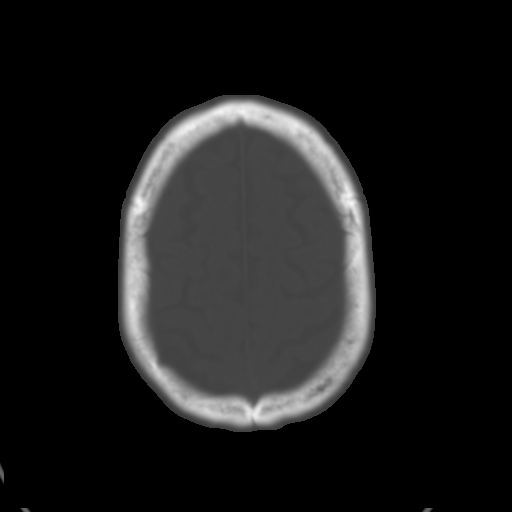
[im 33/36  brain]
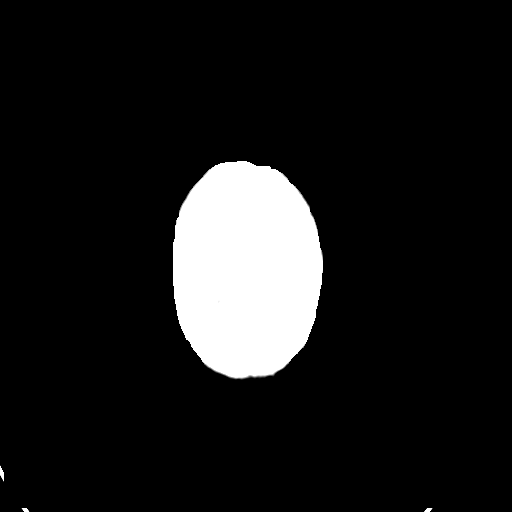

[Series 5: head 3.0 mpr cor · coronal · 0.35mm/px · 3 of 72 slices shown]
[im 24/72  brain]
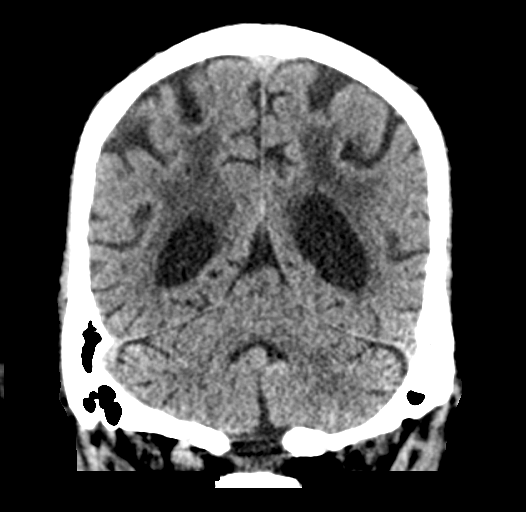
[im 32/72  brain]
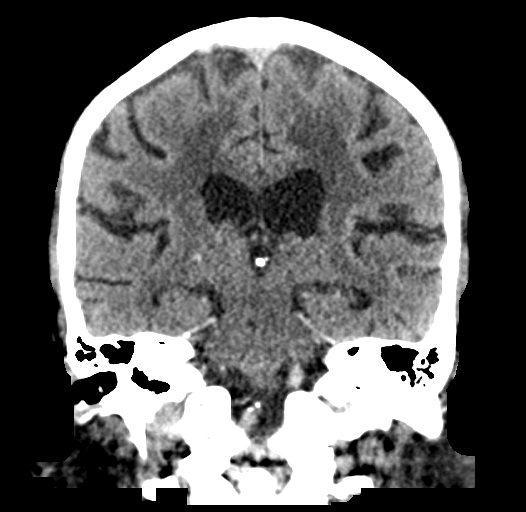
[im 40/72  brain]
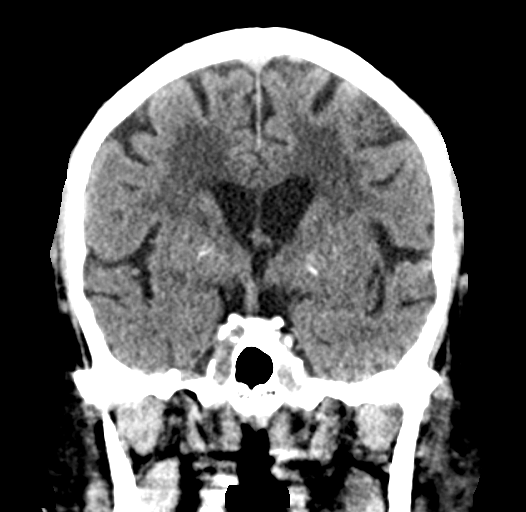

[Series 6: head 3.0 mpr sag · sagittal · 0.36mm/px · 3 of 58 slices shown]
[im 20/58  brain]
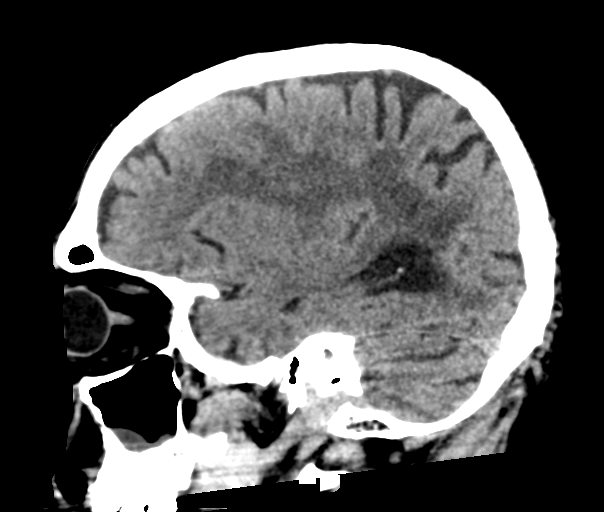
[im 29/58  brain]
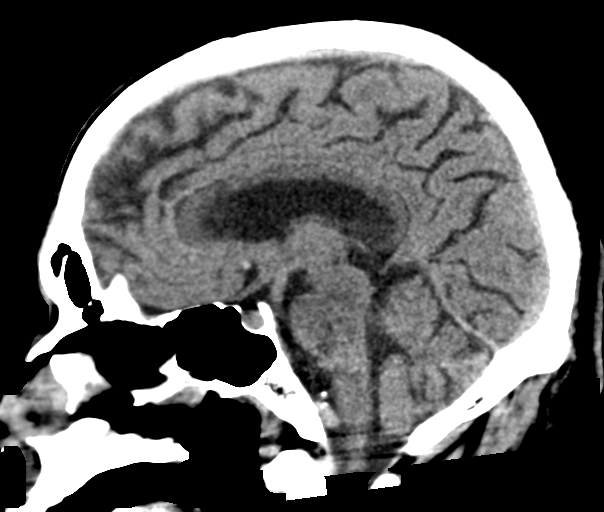
[im 39/58  brain]
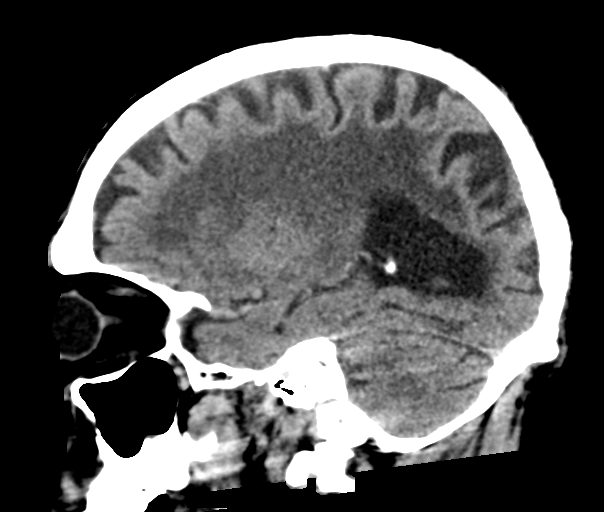

[16 of 47 positions shown; findings below may reference images not displayed]

FINDINGS: Brain: Confluent hypodensities throughout the periventricular white
matter are most consistent with chronic small vessel ischemic
changes. No other signs of acute infarct or hemorrhage. Lateral
ventricles and remaining midline structures are unremarkable. No
acute extra-axial fluid collections. No mass effect.

Vascular: No hyperdense vessel or unexpected calcification.

Skull: Normal. Negative for fracture or focal lesion.

Sinuses/Orbits: No acute finding.

Other: None.
IMPRESSION: 1. Extensive chronic small-vessel ischemic changes throughout the
white matter.
2. No acute intracranial process.

## 2021-08-18 ENCOUNTER — Non-Acute Institutional Stay (SKILLED_NURSING_FACILITY): Payer: Medicare Other | Admitting: Adult Health

## 2021-08-18 ENCOUNTER — Encounter: Payer: Self-pay | Admitting: Adult Health

## 2021-08-18 DIAGNOSIS — I10 Essential (primary) hypertension: Secondary | ICD-10-CM

## 2021-08-18 DIAGNOSIS — F01518 Vascular dementia, unspecified severity, with other behavioral disturbance: Secondary | ICD-10-CM

## 2021-08-18 DIAGNOSIS — F29 Unspecified psychosis not due to a substance or known physiological condition: Secondary | ICD-10-CM | POA: Diagnosis not present

## 2021-08-18 NOTE — Telephone Encounter (Signed)
This encounter was created in error - please disregard.

## 2021-08-18 NOTE — Progress Notes (Signed)
Location:  [facility] Room Number: 118 Place of Service:  SNF (31) Provider:  Kenard Gower, DNP, FNP-BC  Patient Care Team: Pecola Lawless, MD as PCP - General (Internal Medicine)  Extended Emergency Contact Information Primary Emergency Contact: Pascal Lux of [place] Mobile Phone: [phone] Relation: Son Secondary Emergency Contact: Gillermina Hu Home Phone: [phone] Relation: Relative  Code Status:  FULL CODE  Goals of care: Advanced Directive information Advanced Directives 08/19/2021  Does Patient Have a Medical Advance Directive? No  Does patient want to make changes to medical advance directive? -  Would patient like information on creating a medical advance directive? No - Patient declined     Chief Complaint  Patient presents with   Medical Management of Chronic Issues    Routine Visit    HPI:  Pt is a 75 y.o. male seen today for medical management of chronic diseases. He is a long-term care resident of [facility]. He has a PMH of hypertension, COVID-19 pneumonia and hyperlipidemia. He is usually in the day room watching tv, reading a bible or sleeping. SBPs ranging from 91 to 110. He takes Clonidine 0.1 mg/day patch weekly, Benazepril 10 mg daily, Metoprolol tartrate 25 mg BID and Amlodipine 10 mg daily for hypertension. Mood has been stable. 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   Past Medical History:  Diagnosis Date   Acute respiratory failure with hypoxia (HCC)    Cognitive communication deficit    Constipation    Dysphagia    Essential hypertension    GERD without esophagitis    Gout    History of COVID-19    Metabolic encephalopathy    Muscle weakness    Other sequelae of cerebral infarction    Pneumonia due to COVID-19 virus 2019   History reviewed. No pertinent surgical history.  Allergies  Allergen Reactions   Atorvastatin Itching and Rash     Outpatient Encounter Medications as of 08/18/2021  Medication Sig   acetaminophen (TYLENOL) 325 MG tablet Take 2 tablets (650 mg total) by mouth every 6 (six) hours as needed for mild pain or headache (fever >/= 101).   albuterol (VENTOLIN HFA) 108 (90 Base) MCG/ACT inhaler Inhale 2 puffs into the lungs every 4 (four) hours as needed for wheezing or shortness of breath.   amLODipine (NORVASC) 10 MG tablet Take 10 mg by mouth daily.   aspirin EC 81 MG tablet Take 1 tablet (81 mg total) by mouth daily.   benazepril (LOTENSIN) 10 MG tablet Take 10 mg by mouth daily.   bisacodyl (DULCOLAX) 10 MG suppository Place 10 mg rectally daily as needed for moderate constipation.   cloNIDine (CATAPRES - DOSED IN MG/24 HR) 0.1 mg/24hr patch Place 0.1 mg onto the skin once a week.   magnesium hydroxide (MILK OF MAGNESIA) 400 MG/5ML suspension Take 30 mLs by mouth daily as needed for mild constipation.   metoprolol tartrate (LOPRESSOR) 25 MG tablet Take 25 mg by mouth 2 (two) times daily.   ondansetron (ZOFRAN) 4 MG tablet Take 1 tablet (4 mg total) by mouth every 6 (six) hours as needed for nausea.   polyethylene glycol (MIRALAX / GLYCOLAX) 17 g packet Take 17 g by mouth daily as needed for mild constipation.   QUEtiapine (SEROQUEL) 25 MG tablet Take 12.5 mg by mouth 2 (two) times daily.   Sodium Phosphates (RA SALINE ENEMA RE) Place 1 Dose rectally as needed.   No facility-administered encounter medications  on file as of 08/18/2021.    Review of Systems  GENERAL: No change in appetite, no fatigue, no weight changes, no fever or chills  MOUTH and THROAT: Denies oral discomfort, gingival pain or bleeding RESPIRATORY: no cough, SOB, DOE, wheezing, hemoptysis CARDIAC: No chest pain, edema or palpitations GI: No abdominal pain, diarrhea, constipation, heart burn, nausea or vomiting GU: Denies dysuria, frequency, hematuria or discharge NEUROLOGICAL: Denies dizziness, syncope, numbness, or  headache PSYCHIATRIC: Denies feelings of depression or anxiety. No report of hallucinations, insomnia, paranoia, or agitation   Immunization History  Administered Date(s) Administered   Influenza, High Dose Seasonal PF 08/14/2014   Moderna SARS-COV2 Booster Vaccination 01/20/2021   Moderna Sars-Covid-2 Vaccination 05/20/2020, 06/16/2020   Pneumococcal Polysaccharide-23 03/04/2020   Unspecified SARS-COV-2 Vaccination 01/20/2021   Pertinent  Health Maintenance Due  Topic Date Due   COLONOSCOPY (Pts 45-16yrs Insurance coverage will need to be confirmed)  Never done   INFLUENZA VACCINE  06/15/2021   No flowsheet data found.   Vitals:   08/18/21 1153  BP: 107/73  Pulse: 78  Temp: 97.9 F (36.6 C)  Height: 5\' 8"  (1.727 m)   Body mass index is 26.46 kg/m.  Physical Exam  GENERAL APPEARANCE: Well nourished. In no acute distress. Normal body habitus SKIN:  Skin is warm and dry.  MOUTH and THROAT: Lips are without lesions. Oral mucosa is moist and without lesions.  RESPIRATORY: Breathing is even & unlabored, BS CTAB CARDIAC: RRR, no murmur,no extra heart sounds, no edema GI: Abdomen soft, normal BS, no masses, no tenderness EXTREMITIES:  Able to move X extremities NEUROLOGICAL: There is no tremor. Speech is clear. Alert to self, disoriented to time and place. PSYCHIATRIC:  Affect and behavior are appropriate  Labs reviewed: Recent Labs    11/27/20 0000  NA 139  K 4.7  CL 106  CO2 19  BUN 22*  CREATININE 1.2  CALCIUM 9.2   Recent Labs    11/27/20 0000  AST 14  ALT 9*  ALKPHOS 75  ALBUMIN 4.1   Recent Labs    11/27/20 0000  WBC 7.2  NEUTROABS 3.50  HGB 16.0  HCT 46  PLT 246   Lab Results  Component Value Date   TSH 1.20 11/27/2020   Lab Results  Component Value Date   HGBA1C 5.6 02/26/2020     Assessment/Plan  1. Essential hypertension -  BPs soft, will decrease Amlodipine from 10 mg daily to 5 mg daily -  continue Clonidine, Metoprolol  tartrate and Benazepril -  monitor BPs  2. Psychosis, unspecified psychosis type (HCC) -   mood is stable, continue Quetiapine -  followed by psych NP  3. Vascular dementia with behavior disturbance -  BIMS score 3/15, ranging in severe cognitive impairment -        Family/ staff Communication:   Labs/tests ordered:    Goals of care:      4/15, DNP, MSN, FNP-BC [facility] and [facility] [phone] (Monday-Friday 8:00 a.m. - 5:00 p.m.) [phone] (after hours)

## 2021-08-19 ENCOUNTER — Encounter: Payer: Self-pay | Admitting: Adult Health

## 2021-08-19 ENCOUNTER — Non-Acute Institutional Stay (SKILLED_NURSING_FACILITY): Payer: Medicare Other | Admitting: Adult Health

## 2021-08-19 DIAGNOSIS — F0150 Vascular dementia without behavioral disturbance: Secondary | ICD-10-CM

## 2021-08-19 DIAGNOSIS — Z7189 Other specified counseling: Secondary | ICD-10-CM

## 2021-08-19 DIAGNOSIS — I10 Essential (primary) hypertension: Secondary | ICD-10-CM | POA: Diagnosis not present

## 2021-08-19 DIAGNOSIS — N1831 Chronic kidney disease, stage 3a: Secondary | ICD-10-CM

## 2021-08-19 NOTE — Progress Notes (Signed)
Location:  [facility] Room Number: 188-A Place of Service:  SNF (31) Provider:  Kenard Gower, DNP, FNP-BC  Patient Care Team: Pecola Lawless, MD as PCP - General (Internal Medicine)  Extended Emergency Contact Information Primary Emergency Contact: Pascal Lux of [place] Mobile Phone: [phone] Relation: Son Secondary Emergency Contact: Gillermina Hu Home Phone: [phone] Relation: Relative  Code Status:  Full Code   Goals of care: Advanced Directive information Advanced Directives 08/19/2021  Does Patient Have a Medical Advance Directive? No  Does patient want to make changes to medical advance directive? -  Would patient like information on creating a medical advance directive? No - Patient declined     Chief Complaint  Patient presents with   Advanced Directive    Advance care planning/Care plan meeting     HPI:  Pt is a 75 y.o. male seen today for advance care planning attended by resident, NP, Life Enrichment, Dietitian, MDS Coordinator and Child psychotherapist. He stated that he likes anything about the bible. He has been refusing OT. Latest weight is 174.4 lbs, stable. Discussed medications, vital signs and weights. He stated that he doesn't take medications when he was at home. He was happy to attend the meeting. He remains to be full code. HCPOA was called via telephone  2X but did not answer. The meeting lasted for 20 minutes.    Past Medical History:  Diagnosis Date   Acute respiratory failure with hypoxia (HCC)    Cognitive communication deficit    Constipation    Dysphagia    Essential hypertension    GERD without esophagitis    Gout    History of COVID-19    Metabolic encephalopathy    Muscle weakness    Other sequelae of cerebral infarction    Pneumonia due to COVID-19 virus 2019   History reviewed. No pertinent surgical history.  Allergies  Allergen Reactions   Atorvastatin Itching and Rash     Outpatient Encounter Medications as of 08/19/2021  Medication Sig   acetaminophen (TYLENOL) 325 MG tablet Take 2 tablets (650 mg total) by mouth every 6 (six) hours as needed for mild pain or headache (fever >/= 101).   albuterol (VENTOLIN HFA) 108 (90 Base) MCG/ACT inhaler Inhale 2 puffs into the lungs every 4 (four) hours as needed for wheezing or shortness of breath.   amLODipine (NORVASC) 10 MG tablet Take 10 mg by mouth daily.   aspirin EC 81 MG tablet Take 1 tablet (81 mg total) by mouth daily.   benazepril (LOTENSIN) 10 MG tablet Take 10 mg by mouth daily.   bisacodyl (DULCOLAX) 10 MG suppository Place 10 mg rectally daily as needed for moderate constipation.   cloNIDine (CATAPRES - DOSED IN MG/24 HR) 0.1 mg/24hr patch Place 0.1 mg onto the skin once a week.   magnesium hydroxide (MILK OF MAGNESIA) 400 MG/5ML suspension Take 30 mLs by mouth daily as needed for mild constipation.   metoprolol tartrate (LOPRESSOR) 25 MG tablet Take 25 mg by mouth 2 (two) times daily.   Nutritional Supplements (NUTRITIONAL SUPPLEMENT PO) Take 1 Canister by mouth daily at 12 noon. Magic Cup to help stabilize cup   ondansetron (ZOFRAN) 4 MG tablet Take 1 tablet (4 mg total) by mouth every 6 (six) hours as needed for nausea.   polyethylene glycol (MIRALAX / GLYCOLAX) 17 g packet Take 17 g by mouth daily as needed for mild constipation.   QUEtiapine (SEROQUEL) 25 MG tablet Take 12.5  mg by mouth 2 (two) times daily.   Sodium Phosphates (RA SALINE ENEMA RE) Place 1 Dose rectally as needed.   No facility-administered encounter medications on file as of 08/19/2021.    Review of Systems  GENERAL: No change in appetite, no fatigue, no weight changes, no fever or chills  MOUTH and THROAT: Denies oral discomfort, gingival pain or bleeding RESPIRATORY: no cough, SOB, DOE, wheezing, hemoptysis CARDIAC: No chest pain, edema or palpitations GI: No abdominal pain, diarrhea, constipation, heart burn, nausea or  vomiting GU: Denies dysuria, frequency, hematuria or discharge NEUROLOGICAL: Denies dizziness, syncope, numbness, or headache PSYCHIATRIC: Denies feelings of depression or anxiety. No report of hallucinations, insomnia, paranoia, or agitation   Immunization History  Administered Date(s) Administered   Influenza, High Dose Seasonal PF 08/14/2014   Moderna SARS-COV2 Booster Vaccination 01/20/2021   Moderna Sars-Covid-2 Vaccination 05/20/2020, 06/16/2020   Pneumococcal Polysaccharide-23 03/04/2020   Unspecified SARS-COV-2 Vaccination 01/20/2021   Pertinent  Health Maintenance Due  Topic Date Due   COLONOSCOPY (Pts 45-61yrs Insurance coverage will need to be confirmed)  Never done   INFLUENZA VACCINE  06/15/2021   No flowsheet data found.   Vitals:   08/19/21 0931  BP: 129/81  Pulse: 71  Resp: 18  Temp: 97.7 F (36.5 C)  Weight: 174 lb 6.4 oz (79.1 kg)  Height: 5\' 8"  (1.727 m)   Body mass index is 26.52 kg/m.  Physical Exam  GENERAL APPEARANCE: Well nourished. In no acute distress. Normal body habitus SKIN:  Skin is warm and dry.  MOUTH and THROAT: Lips are without lesions. Oral mucosa is moist and without lesions.  RESPIRATORY: Breathing is even & unlabored, BS CTAB CARDIAC: RRR, no murmur,no extra heart sounds, no edema GI: Abdomen soft, normal BS, no masses, no tenderness NEURO:  Alert to self, disoriented to time and place.  No tremors EXTREMITIES:  Able to move X 4 extremities PSYCHIATRIC:  Affect and behavior are appropriate  Labs reviewed: Recent Labs    11/27/20 0000  NA 139  K 4.7  CL 106  CO2 19  BUN 22*  CREATININE 1.2  CALCIUM 9.2   Recent Labs    11/27/20 0000  AST 14  ALT 9*  ALKPHOS 75  ALBUMIN 4.1   Recent Labs    11/27/20 0000  WBC 7.2  NEUTROABS 3.50  HGB 16.0  HCT 46  PLT 246   Lab Results  Component Value Date   TSH 1.20 11/27/2020   Lab Results  Component Value Date   HGBA1C 5.6 02/26/2020   No results found for:  CHOL, HDL, LDLCALC, LDLDIRECT, TRIG, CHOLHDL  Significant Diagnostic Results in last 30 days:  No results found.  Assessment/Plan  1. ACP (advance care planning) -  remains to be full code -  discussed medications, vital signs and weights  2. Essential hypertension -  continue Amlodipine, Benazepril, Metoprolol tartrate and Clonidine patchl  3. Stage 3a chronic kidney disease (HCC) Lab Results  Component Value Date   NA 139 11/27/2020   K 4.7 11/27/2020   CO2 19 11/27/2020   GLUCOSE 111 (H) 03/04/2020   BUN 22 (A) 11/27/2020   CREATININE 1.2 11/27/2020   CALCIUM 9.2 11/27/2020   GFRNONAA 59.49 11/27/2020   -  stable  4. Vascular dementia without behavioral disturbance (HCC) -  BIMS score 3/15, ranging in severe cognitive impairment      Family/ staff Communication:   Discussed plan of care with resident  and IDT.  Labs/tests ordered:  none  Goals of care:   Long-term care   Kenard Gower, DNP, MSN, FNP-BC [facility] [phone] (Monday-Friday 8:00 a.m. - 5:00 p.m.) [phone] (after hours)

## 2021-10-07 ENCOUNTER — Non-Acute Institutional Stay (SKILLED_NURSING_FACILITY): Payer: Medicare Other | Admitting: Adult Health

## 2021-10-07 ENCOUNTER — Encounter: Payer: Self-pay | Admitting: Adult Health

## 2021-10-07 DIAGNOSIS — F29 Unspecified psychosis not due to a substance or known physiological condition: Secondary | ICD-10-CM | POA: Diagnosis not present

## 2021-10-07 DIAGNOSIS — I10 Essential (primary) hypertension: Secondary | ICD-10-CM

## 2021-10-07 DIAGNOSIS — F01518 Vascular dementia, unspecified severity, with other behavioral disturbance: Secondary | ICD-10-CM

## 2021-10-07 NOTE — Progress Notes (Signed)
Location:  [facility] Room Number: 118 Place of Service:  SNF (31) Provider:  Kenard Gower, DNP, FNP-BC  Patient Care Team: Pecola Lawless, MD as PCP - General (Internal Medicine)  Extended Emergency Contact Information Primary Emergency Contact: Pascal Lux of [place] Mobile Phone: [phone] Relation: Son Secondary Emergency Contact: Gillermina Hu Home Phone: [phone] Relation: Relative  Code Status:  FULL CODE  Goals of care: Advanced Directive information Advanced Directives 10/07/2021  Does Patient Have a Medical Advance Directive? No  Does patient want to make changes to medical advance directive? -  Would patient like information on creating a medical advance directive? -     Chief Complaint  Patient presents with   Acute Visit    Uncontrolled hypertension    HPI:  Pt is a 75 y.o. Cummings seen today for an acute visit. He is a long-term care resident of [facility]. BP today was 168/109. He denies headache, dizziness nor SOB. Staff reported that he has been refusing his medications. He currently takes amlodipine 5 mg daily, clonidine 0.1 mg/day patch weekly, benazepril 10 mg daily and metoprolol tartrate 25 mg twice a day for hypertension. SBPs ranging from 127 to 148, with outlier 168. 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 He was seen in his room today. Explained that it is important that he take his medications due to BPs being elevated. Resident took all his medications and had his patch put on his back without hesitation.   Past Medical History:  Diagnosis Date   Acute respiratory failure with hypoxia (HCC)    Cognitive communication deficit    Constipation    Dysphagia    Essential hypertension    GERD without esophagitis    Gout    History of COVID-19    Metabolic encephalopathy    Muscle weakness    Other sequelae of cerebral infarction    Pneumonia due to COVID-19  virus 2019   History reviewed. No pertinent surgical history.  Allergies  Allergen Reactions   Atorvastatin Itching and Rash    Outpatient Encounter Medications as of 10/07/2021  Medication Sig   acetaminophen (TYLENOL) 325 MG tablet Take 2 tablets (650 mg total) by mouth every 6 (six) hours as needed for mild pain or headache (fever >/= 101).   albuterol (VENTOLIN HFA) 108 (90 Base) MCG/ACT inhaler Inhale 2 puffs into the lungs every 4 (four) hours as needed for wheezing or shortness of breath.   amLODipine (NORVASC) 10 MG tablet Take 10 mg by mouth daily.   aspirin EC 81 MG tablet Take 1 tablet (81 mg total) by mouth daily.   benazepril (LOTENSIN) 10 MG tablet Take 10 mg by mouth daily.   bisacodyl (DULCOLAX) 10 MG suppository Place 10 mg rectally daily as needed for moderate constipation.   cloNIDine (CATAPRES - DOSED IN MG/24 HR) 0.1 mg/24hr patch Place 0.1 mg onto the skin once a week.   magnesium hydroxide (MILK OF MAGNESIA) 400 MG/5ML suspension Take 30 mLs by mouth daily as needed for mild constipation.   metoprolol tartrate (LOPRESSOR) 25 MG tablet Take 25 mg by mouth 2 (two) times daily.   ondansetron (ZOFRAN) 4 MG tablet Take 1 tablet (4 mg total) by mouth every 6 (six) hours as needed for nausea.   polyethylene glycol (MIRALAX / GLYCOLAX) 17 g packet Take 17 g by mouth daily as needed for mild constipation.   QUEtiapine (SEROQUEL) 25 MG tablet Take 12.5 mg  by mouth 2 (two) times daily.   Sodium Phosphates (RA SALINE ENEMA RE) Place 1 Dose rectally as needed.   [DISCONTINUED] Nutritional Supplements (NUTRITIONAL SUPPLEMENT PO) Take 1 Canister by mouth daily at 12 noon. Magic Cup to help stabilize cup   No facility-administered encounter medications on file as of 10/07/2021.    Review of Systems  GENERAL: No fever or chills  MOUTH and THROAT: Denies oral discomfort, gingival pain or bleeding, pain from teeth or hoarseness   RESPIRATORY: no cough, SOB, DOE, wheezing,  hemoptysis CARDIAC: No chest pain, edema or palpitations GI: No abdominal pain, diarrhea, constipation, heart burn, nausea or vomiting GU: Denies dysuria, frequency, hematuria or discharge NEUROLOGICAL: Denies dizziness, syncope, numbness, or headache PSYCHIATRIC: Denies feelings of depression or anxiety. No report of hallucinations, insomnia, paranoia, or agitation   Immunization History  Administered Date(s) Administered   Influenza, High Dose Seasonal PF 08/14/2014   Moderna SARS-COV2 Booster Vaccination 01/20/2021   Moderna Sars-Covid-2 Vaccination 05/20/2020, 06/16/2020   Pneumococcal Polysaccharide-23 03/04/2020   Unspecified SARS-COV-2 Vaccination 01/20/2021   Pertinent  Health Maintenance Due  Topic Date Due   COLONOSCOPY (Pts 45-100yrs Insurance coverage will need to be confirmed)  Never done   INFLUENZA VACCINE  06/15/2021   Fall Risk 03/02/2020 03/02/2020 03/03/2020 03/03/2020 03/04/2020  Patient Fall Risk Level High fall risk High fall risk High fall risk High fall risk High fall risk     Vitals:   10/07/21 1000  BP: (!) 168/109  Pulse: 88  Resp: 18  Temp: 97.9 F (36.6 C)  Weight: 167 lb 12.8 oz (76.1 kg)  Height: 5\' 8"  (1.727 m)   Body mass index is 25.51 kg/m.  Physical Exam  GENERAL APPEARANCE: Well nourished. In no acute distress. Normal body habitus SKIN:  Skin is warm and dry.  MOUTH and THROAT: Lips are without lesions. Oral mucosa is moist and without lesions.  RESPIRATORY: Breathing is even & unlabored, BS CTAB CARDIAC: RRR, no murmur,no extra heart sounds, no edema GI: Abdomen soft, normal BS, no masses, no tenderness EXTREMITIES:  Able to move X 4 extremities NEUROLOGICAL: There is no tremor. Speech is clear. Alert to self, disoriented to time and place. PSYCHIATRIC:  Affect and behavior are appropriate  Labs reviewed: Recent Labs    11/27/20 0000 07/02/21 0000  NA 139 143  K 4.7 4.5  CL 106 104  CO2 19 20  BUN 22* 19  CREATININE 1.2 1.3   CALCIUM 9.2 9.9   Recent Labs    11/27/20 0000 07/02/21 0000  AST 14 20  ALT 9* 14  ALKPHOS 75  --   ALBUMIN 4.1 4.6   Recent Labs    11/27/20 0000 07/02/21 0000  WBC 7.2 9.1  NEUTROABS 3.50 4.50  HGB 16.0 16.6  HCT 46 50  PLT 246 269   Lab Results  Component Value Date   TSH 1.20 11/27/2020   Lab Results  Component Value Date   HGBA1C 5.6 02/26/2020   No results found for: CHOL, HDL, LDLCALC, LDLDIRECT, TRIG, CHOLHDL  Significant Diagnostic Results in last 30 days:  No results found.  Assessment/Plan  1. Uncontrolled hypertension -   Continue amlodipine 5 mg daily, clonidine 0.1 mg/day patch weekly, benazepril 10 mg daily and metoprolol tartrate 25 mg twice a day  -  monitor BPs  2. Psychosis, unspecified psychosis type (HCC) -  he has refused medications in the past and will need to continue to encourage him to take his medications -  continue Quetiapine 12.5 mg BID -  will refer for psych consult  3. Vascular dementia with behavior disturbance -  BIMS score 3/15, ranging in severe cognitive impairment -   continue supportive care   Family/ staff Communication: Discussed plan of care with resident and charge nurse.  Labs/tests ordered: None  Goals of care:   Long-term care   Durenda Age, DNP, MSN, FNP-BC [facility] [phone] (Monday-Friday 8:00 a.m. - 5:00 p.m.) [phone] (after hours)

## 2021-10-12 ENCOUNTER — Encounter: Payer: Self-pay | Admitting: Internal Medicine

## 2021-10-12 ENCOUNTER — Non-Acute Institutional Stay (SKILLED_NURSING_FACILITY): Payer: Medicare Other | Admitting: Internal Medicine

## 2021-10-12 DIAGNOSIS — N1831 Chronic kidney disease, stage 3a: Secondary | ICD-10-CM

## 2021-10-12 DIAGNOSIS — Z8673 Personal history of transient ischemic attack (TIA), and cerebral infarction without residual deficits: Secondary | ICD-10-CM | POA: Diagnosis not present

## 2021-10-12 DIAGNOSIS — I10 Essential (primary) hypertension: Secondary | ICD-10-CM | POA: Diagnosis not present

## 2021-10-12 DIAGNOSIS — F0390 Unspecified dementia without behavioral disturbance: Secondary | ICD-10-CM | POA: Diagnosis not present

## 2021-10-12 NOTE — Assessment & Plan Note (Signed)
CKD is stable with creatinine of 1.3 and GFR of 52.31.  Med list reviewed; no indication for change in meds or dosage.

## 2021-10-12 NOTE — Assessment & Plan Note (Addendum)
Today 10/12/2021 he identified the date as "[birth year], 1997".  He identified the POTUS as Bush. Staff reports that he has been refusing medications.  [facility] NP was able to get him to acquiesce and be compliant.  This has been an intermittent pattern in the past as well.

## 2021-10-12 NOTE — Assessment & Plan Note (Signed)
BP controlled; no change in antihypertensive medications  

## 2021-10-12 NOTE — Progress Notes (Signed)
NURSING HOME LOCATION:  [facility] ROOM NUMBER:  118 A  CODE STATUS:  Full Code  PCP:  Douglass Rivers MD  This is a nursing facility follow up visit of chronic medical diagnoses & to document compliance with Regulation 483.30 (c) in The Long Term Care Survey Manual Phase 2 which mandates caregiver visit ( visits can alternate among physician, PA or NP as per statutes) within 10 days of 30 days / 60 days/ 90 days post admission to SNF date    Interim medical record and care since last SNF visit was updated with review of diagnostic studies and change in clinical status since last visit were documented.  HPI: He is a permanent resident of this facility with medical diagnoses of essential hypertension, GERD, history of gout, history of stroke, and history of COVID-19 pneumonia. Current labs reveal CKD stage IIa with creatinine 1.3 and GFR 52.31.  H/H has shown some progressive increase with most recent values of 16.6/58.  Review of systems: Dementia invalidated responses. Date given as "[birth year], 1997".  POTUS named as Camera operator. He denied any active symptoms responding to all queries with a monosyllabic no and negative shake of his head.  He states that he was doing "good". Staff reports that he has been refusing medicines recently.  This has been an intermittent pattern in the past.  [facility] NP discussed this with and he acquiesced to compliance.  Constitutional: No fever, significant weight change, fatigue  Eyes: No redness, discharge, pain, vision change ENT/mouth: No nasal congestion,  purulent discharge, earache, change in hearing, sore throat  Cardiovascular: No chest pain, palpitations, paroxysmal nocturnal dyspnea, claudication, edema  Respiratory: No cough, sputum production, hemoptysis, DOE, significant snoring, apnea   Gastrointestinal: No heartburn, dysphagia, abdominal pain, nausea /vomiting, rectal bleeding, melena, change in bowels Genitourinary: No dysuria,  hematuria, pyuria, incontinence, nocturia Musculoskeletal: No joint stiffness, joint swelling, weakness, pain Dermatologic: No rash, pruritus, change in appearance of skin Neurologic: No dizziness, headache, syncope, seizures, numbness, tingling Psychiatric: No significant anxiety, depression, insomnia, anorexia Endocrine: No change in hair/skin/nails, excessive thirst, excessive hunger, excessive urination  Hematologic/lymphatic: No significant bruising, lymphadenopathy, abnormal bleeding Allergy/immunology: No itchy/watery eyes, significant sneezing, urticaria, angioedema  Physical exam:  Pertinent or positive findings: He appears his age.  He is Transport planner.  Affect is markedly flat.  The left lacrimal gland is more prominent than the right.  The lids are puffy.  Ptosis is greater on the left than the right.  He is missing multiple teeth and the remaining teeth are coated.  Heart rate is slow.  He has minor scattered rales.  Abdomen is protuberant.  Pedal pulses are surprisingly good.  There are sclerotic skin changes over the shins.  Strength to opposition is fair-good.  General appearance: Adequately nourished; no acute distress, increased work of breathing is present.   Lymphatic: No lymphadenopathy about the head, neck, axilla. Eyes: No conjunctival inflammation or lid edema is present. There is no scleral icterus. Ears:  External ear exam shows no significant lesions or deformities.   Nose:  External nasal examination shows no deformity or inflammation. Nasal mucosa are pink and moist without lesions, exudates Neck:  No thyromegaly, masses, tenderness noted.    Heart:  No gallop, murmur, click, rub .  Lungs: without wheezes, rhonchi,  rubs. Abdomen: Bowel sounds are normal. Abdomen is soft and nontender with no organomegaly, hernias, masses. GU: Deferred  Extremities:  No cyanosis, clubbing, edema  Neurologic exam :Balance, Rhomberg,  finger to nose testing could not be completed due to  clinical state Skin: Warm & dry w/o tenting.  See summary under each active problem in the Problem List with associated updated therapeutic plan

## 2021-10-12 NOTE — Patient Instructions (Signed)
See assessment and plan under each diagnosis in the problem list and acutely for this visit 

## 2021-10-12 NOTE — Assessment & Plan Note (Addendum)
10/12/2021 he gave the date as "[birth year]".  He also named the POTUS as Bush.   As noted he is exhibiting intermittent noncompliance with medications.

## 2021-11-11 ENCOUNTER — Non-Acute Institutional Stay (SKILLED_NURSING_FACILITY): Payer: Medicare Other | Admitting: Adult Health

## 2021-11-11 ENCOUNTER — Encounter: Payer: Self-pay | Admitting: Adult Health

## 2021-11-11 DIAGNOSIS — I10 Essential (primary) hypertension: Secondary | ICD-10-CM | POA: Diagnosis not present

## 2021-11-11 DIAGNOSIS — N1831 Chronic kidney disease, stage 3a: Secondary | ICD-10-CM

## 2021-11-11 DIAGNOSIS — F01518 Vascular dementia, unspecified severity, with other behavioral disturbance: Secondary | ICD-10-CM | POA: Diagnosis not present

## 2021-11-11 DIAGNOSIS — F29 Unspecified psychosis not due to a substance or known physiological condition: Secondary | ICD-10-CM

## 2021-11-11 NOTE — Progress Notes (Signed)
Location:  [facility] Room Number: 118-A Place of Service:  SNF (31) Provider:  Kenard Gower, DNP, FNP-BC  Patient Care Team: Pecola Lawless, MD as PCP - General (Internal Medicine)  Extended Emergency Contact Information Primary Emergency Contact: Pascal Lux of [place] Mobile Phone: [phone] Relation: Son Secondary Emergency Contact: Gillermina Hu Home Phone: [phone] Relation: Relative  Code Status:    Goals of care: Advanced Directive information Advanced Directives 11/11/2021  Does Patient Have a Medical Advance Directive? No  Does patient want to make changes to medical advance directive? -  Would patient like information on creating a medical advance directive? No - Patient declined     Chief Complaint  Patient presents with   Medical Management of Chronic Issues    Routine visit. Discuss need for colonoscopy, shingrix, PCV, covid booster, and flu vaccine or postpone if patient refuses.     HPI:  Pt is a 75 y.o. male seen today for medical management of chronic diseases. Pt with pmh of htn, dementia, psychosis and CKD. Pt is [facility] at [facility] for LTC.  Pt has refused COVID, shingles, and flu shot He also does not wish to have colonoscopy despite education on need.   Htn- SBPs ranging from 117 to 145, on amlodipine and benazepril and clonidine patch  Dementia- supported by staff BIMs score of 3/15  Psychosis- followed by in house psych, on Seroquel      Past Medical History:  Diagnosis Date   Acute respiratory failure with hypoxia (HCC)    Cognitive communication deficit    Constipation    Dysphagia    Essential hypertension    GERD without esophagitis    Gout    History of COVID-19    Metabolic encephalopathy    Muscle weakness    Other sequelae of cerebral infarction    Pneumonia due to COVID-19 virus 2019   History reviewed. No pertinent surgical history.  Allergies  Allergen  Reactions   Atorvastatin Itching and Rash    Outpatient Encounter Medications as of 11/11/2021  Medication Sig   acetaminophen (TYLENOL) 325 MG tablet Take 2 tablets (650 mg total) by mouth every 6 (six) hours as needed for mild pain or headache (fever >/= 101).   albuterol (VENTOLIN HFA) 108 (90 Base) MCG/ACT inhaler Inhale 2 puffs into the lungs every 4 (four) hours as needed for wheezing or shortness of breath.   amLODipine (NORVASC) 5 MG tablet Take 5 mg by mouth daily.   aspirin EC 81 MG tablet Take 1 tablet (81 mg total) by mouth daily.   benazepril (LOTENSIN) 10 MG tablet Take 10 mg by mouth daily.   bisacodyl (DULCOLAX) 10 MG suppository Place 10 mg rectally daily as needed for moderate constipation.   cloNIDine (CATAPRES - DOSED IN MG/24 HR) 0.1 mg/24hr patch Place 0.1 mg onto the skin once a week.   magnesium hydroxide (MILK OF MAGNESIA) 400 MG/5ML suspension Take 30 mLs by mouth daily as needed for mild constipation.   metoprolol tartrate (LOPRESSOR) 25 MG tablet Take 25 mg by mouth 2 (two) times daily.   ondansetron (ZOFRAN) 4 MG tablet Take 1 tablet (4 mg total) by mouth every 6 (six) hours as needed for nausea.   polyethylene glycol (MIRALAX / GLYCOLAX) 17 g packet Take 17 g by mouth daily as needed for mild constipation.   QUEtiapine (SEROQUEL) 25 MG tablet Take 12.5 mg by mouth 2 (two) times daily.   Sodium Phosphates (RA SALINE  ENEMA RE) Place 1 Dose rectally as needed.   UNABLE TO FIND Med Name: Magic Cup once daily to help stabilize weight   [DISCONTINUED] amLODipine (NORVASC) 10 MG tablet Take 10 mg by mouth daily.   No facility-administered encounter medications on file as of 11/11/2021.    Review of Systems  Constitutional:  Negative for activity change, appetite change and fever.  HENT:  Negative for sore throat.   Eyes: Negative.   Cardiovascular:  Negative for chest pain and leg swelling.  Gastrointestinal:  Negative for abdominal distention, diarrhea and  vomiting.  Genitourinary:  Negative for dysuria, frequency and urgency.  Skin:  Negative for color change.  Neurological:  Negative for dizziness and headaches.  Psychiatric/Behavioral:  Negative for behavioral problems and sleep disturbance. The patient is not nervous/anxious.      Immunization History  Administered Date(s) Administered   Influenza, High Dose Seasonal PF 08/14/2014   Moderna SARS-COV2 Booster Vaccination 01/20/2021   Moderna Sars-Covid-2 Vaccination 05/20/2020, 06/16/2020   Pneumococcal Polysaccharide-23 03/04/2020   Unspecified SARS-COV-2 Vaccination 01/20/2021   Pertinent  Health Maintenance Due  Topic Date Due   INFLUENZA VACCINE  02/12/2022 (Originally 06/15/2021)   COLONOSCOPY (Pts 45-65yrs Insurance coverage will need to be confirmed)  11/11/2022 (Originally 07/26/1992)   Fall Risk 03/02/2020 03/02/2020 03/03/2020 03/03/2020 03/04/2020  Patient Fall Risk Level High fall risk High fall risk High fall risk High fall risk High fall risk     Vitals:   11/11/21 0934  BP: 132/88  Pulse: 88  Resp: 18  Temp: (!) 97.2 F (36.2 C)  Weight: 166 lb 12.8 oz (75.7 kg)  Height: 5\' 8"  (1.727 m)   Body mass index is 25.36 kg/m.  Physical Exam Constitutional:      Appearance: Normal appearance.     Comments: Alert to self, disoriented to time and place.  HENT:     Head: Normocephalic and atraumatic.     Mouth/Throat:     Mouth: Mucous membranes are moist.  Eyes:     Conjunctiva/sclera: Conjunctivae normal.  Cardiovascular:     Rate and Rhythm: Normal rate and regular rhythm.     Pulses: Normal pulses.     Heart sounds: Normal heart sounds.  Pulmonary:     Effort: Pulmonary effort is normal.     Breath sounds: Normal breath sounds.  Abdominal:     General: Bowel sounds are normal.     Palpations: Abdomen is soft.  Musculoskeletal:        General: No swelling. Normal range of motion.     Cervical back: Normal range of motion.  Skin:    General: Skin is warm  and dry.  Neurological:     General: No focal deficit present.     Mental Status: He is alert and oriented to person, place, and time.  Psychiatric:        Mood and Affect: Mood normal.        Behavior: Behavior normal.       Labs reviewed: Recent Labs    11/27/20 0000 07/02/21 0000  NA 139 143  K 4.7 4.5  CL 106 104  CO2 19 20  BUN 22* 19  CREATININE 1.2 1.3  CALCIUM 9.2 9.9   Recent Labs    11/27/20 0000 07/02/21 0000  AST 14 20  ALT 9* 14  ALKPHOS 75  --   ALBUMIN 4.1 4.6   Recent Labs    11/27/20 0000 07/02/21 0000  WBC 7.2 9.1  NEUTROABS 3.50  4.50  HGB 16.0 16.6  HCT 46 50  PLT 246 269   Lab Results  Component Value Date   TSH 1.20 11/27/2020   Lab Results  Component Value Date   HGBA1C 5.6 02/26/2020   No results found for: CHOL, HDL, LDLCALC, LDLDIRECT, TRIG, CHOLHDL  Significant Diagnostic Results in last 30 days:  No results found.  Assessment/Plan  1. Stage 3a chronic kidney disease (HCC) Chronic and stable Encourage proper hydration Follow metabolic panel Avoid nephrotoxic meds (NSAIDS)  2. Essential hypertension -Blood pressure well controlled Continue current medications Recheck metabolic panel and cbc  3. 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 Vascular dementia with behavior disturbance -stable, no acute changes in cognitive or functional status -Continue supportive care    Family/ staff Communication: Discussed plan of care with resident and charge nurse  Labs/tests ordered:  CMP, CBC  Goals of care:   Long-term care   Durenda Age, DNP, MSN, FNP-BC [facility] [phone] (Monday-Friday 8:00 a.m. - 5:00 p.m.) [phone] (after hours)

## 2021-11-11 NOTE — Progress Notes (Signed)
Location:  [facility] Room Number: 118-A Place of Service:  SNF (31) Provider:  Kenard Gower, DNP, FNP-BC  Patient Care Team: Pecola Lawless, MD as PCP - General (Internal Medicine)  Extended Emergency Contact Information Primary Emergency Contact: Pascal Lux of [place] Mobile Phone: [phone] Relation: Son Secondary Emergency Contact: Gillermina Hu Home Phone: [phone] Relation: Relative  Code Status:  Full Code   Goals of care: Advanced Directive information Advanced Directives 11/11/2021  Does Patient Have a Medical Advance Directive? No  Does patient want to make changes to medical advance directive? -  Would patient like information on creating a medical advance directive? No - Patient declined     Chief Complaint  Patient presents with   Medical Management of Chronic Issues    Routine visit. Discuss need for colonoscopy, shingrix, PCV, covid booster, and flu vaccine or postpone if patient refuses.     HPI:  Pt is a 75 y.o. male seen today for medical management of chronic diseases.     Past Medical History:  Diagnosis Date   Acute respiratory failure with hypoxia (HCC)    Cognitive communication deficit    Constipation    Dysphagia    Essential hypertension    GERD without esophagitis    Gout    History of COVID-19    Metabolic encephalopathy    Muscle weakness    Other sequelae of cerebral infarction    Pneumonia due to COVID-19 virus 2019   History reviewed. No pertinent surgical history.  Allergies  Allergen Reactions   Atorvastatin Itching and Rash    Outpatient Encounter Medications as of 11/11/2021  Medication Sig   acetaminophen (TYLENOL) 325 MG tablet Take 2 tablets (650 mg total) by mouth every 6 (six) hours as needed for mild pain or headache (fever >/= 101).   albuterol (VENTOLIN HFA) 108 (90 Base) MCG/ACT inhaler Inhale 2 puffs into the lungs every 4 (four) hours as needed for  wheezing or shortness of breath.   amLODipine (NORVASC) 5 MG tablet Take 5 mg by mouth daily.   aspirin EC 81 MG tablet Take 1 tablet (81 mg total) by mouth daily.   benazepril (LOTENSIN) 10 MG tablet Take 10 mg by mouth daily.   bisacodyl (DULCOLAX) 10 MG suppository Place 10 mg rectally daily as needed for moderate constipation.   cloNIDine (CATAPRES - DOSED IN MG/24 HR) 0.1 mg/24hr patch Place 0.1 mg onto the skin once a week.   magnesium hydroxide (MILK OF MAGNESIA) 400 MG/5ML suspension Take 30 mLs by mouth daily as needed for mild constipation.   metoprolol tartrate (LOPRESSOR) 25 MG tablet Take 25 mg by mouth 2 (two) times daily.   ondansetron (ZOFRAN) 4 MG tablet Take 1 tablet (4 mg total) by mouth every 6 (six) hours as needed for nausea.   polyethylene glycol (MIRALAX / GLYCOLAX) 17 g packet Take 17 g by mouth daily as needed for mild constipation.   QUEtiapine (SEROQUEL) 25 MG tablet Take 12.5 mg by mouth 2 (two) times daily.   Sodium Phosphates (RA SALINE ENEMA RE) Place 1 Dose rectally as needed.   UNABLE TO FIND Med Name: Magic Cup once daily to help stabilize weight   [DISCONTINUED] amLODipine (NORVASC) 10 MG tablet Take 10 mg by mouth daily.   No facility-administered encounter medications on file as of 11/11/2021.    Review of Systems  GENERAL: No change in appetite, no fatigue, no weight changes, no fever, chills or weakness  SKIN: Denies rash, itching, wounds, ulcer sores, or nail abnormalities EYES: Denies change in vision, dry eyes, eye pain, itching or discharge EARS: Denies change in hearing, ringing in ears, or earache NOSE: Denies nasal congestion or epistaxis MOUTH and THROAT: Denies oral discomfort, gingival pain or bleeding, pain from teeth or hoarseness   RESPIRATORY: no cough, SOB, DOE, wheezing, hemoptysis CARDIAC: No chest pain, edema or palpitations GI: No abdominal pain, diarrhea, constipation, heart burn, nausea or vomiting GU: Denies dysuria, frequency,  hematuria, incontinence, or discharge MUSCULOSKELETAL: Denies joint pain, muscle pain, back pain, restricted movement, or unusual weakness CIRCULATION: Denies claudication, edema of legs, varicosities, or cold extremities NEUROLOGICAL: Denies dizziness, syncope, numbness, or headache PSYCHIATRIC: Denies feelings of depression or anxiety. No report of hallucinations, insomnia, paranoia, or agitation ENDOCRINE: Denies polyphagia, polyuria, polydipsia, heat or cold intolerance HEME/LYMPH: Denies excessive bruising, petechia, enlarged lymph nodes, or bleeding problems IMMUNOLOGIC: Denies history of frequent infections, AIDS, or use of immunosuppressive agents   Immunization History  Administered Date(s) Administered   Influenza, High Dose Seasonal PF 08/14/2014   Moderna SARS-COV2 Booster Vaccination 01/20/2021   Moderna Sars-Covid-2 Vaccination 05/20/2020, 06/16/2020   Pneumococcal Polysaccharide-23 03/04/2020   Unspecified SARS-COV-2 Vaccination 01/20/2021   Pertinent  Health Maintenance Due  Topic Date Due   COLONOSCOPY (Pts 45-56yrs Insurance coverage will need to be confirmed)  Never done   INFLUENZA VACCINE  06/15/2021   Fall Risk 03/02/2020 03/02/2020 03/03/2020 03/03/2020 03/04/2020  Patient Fall Risk Level High fall risk High fall risk High fall risk High fall risk High fall risk     Vitals:   11/11/21 0934  BP: 132/88  Pulse: 88  Resp: 18  Temp: (!) 97.2 F (36.2 C)  Weight: 166 lb 12.8 oz (75.7 kg)  Height: 5\' 8"  (1.727 m)   Body mass index is 25.36 kg/m.  Physical Exam  GENERAL APPEARANCE: Well nourished. In no acute distress. Normal body habitus SKIN:  Skin is warm and dry. There are no suspicious lesions or rash HEAD: Normal in size and contour. No evidence of trauma EYES: Lids open and close normally. No blepharitis, entropion or ectropion. PERRL. Conjunctivae are clear and sclerae are white. Lenses are without opacity EARS: Pinnae are normal. Patient hears normal  voice tunes of the examiner MOUTH and THROAT: Lips are without lesions. Oral mucosa is moist and without lesions. Tongue is normal in shape, size, and color and without lesions NECK: supple, trachea midline, no neck masses, no thyroid tenderness, no thyromegaly LYMPHATICS: No LAN in the neck, no supraclavicular LAN RESPIRATORY: Breathing is even & unlabored, BS CTAB CARDIAC: RRR, no murmur,no extra heart sounds, no edema GI: Abdomen soft, normal BS, no masses, no tenderness, no hepatomegaly, no splenomegaly MUSCULOSKELETAL: No deformities. Movement at each extremity is full and painless. Strength is 5/5 at each extremity. Back is without kyphosis or scoliosis CIRCULATION: Pedal pulses are 2+. There is no edema of the legs, ankles and feet NEUROLOGICAL: There is no tremor. Speech is clear PSYCHIATRIC: Alert and oriented X 3. Affect and behavior are appropriate  Labs reviewed: Recent Labs    11/27/20 0000 07/02/21 0000  NA 139 143  K 4.7 4.5  CL 106 104  CO2 19 20  BUN 22* 19  CREATININE 1.2 1.3  CALCIUM 9.2 9.9   Recent Labs    11/27/20 0000 07/02/21 0000  AST 14 20  ALT 9* 14  ALKPHOS 75  --   ALBUMIN 4.1 4.6   Recent Labs    11/27/20  0000 07/02/21 0000  WBC 7.2 9.1  NEUTROABS 3.50 4.50  HGB 16.0 16.6  HCT 46 50  PLT 246 269   Lab Results  Component Value Date   TSH 1.20 11/27/2020   Lab Results  Component Value Date   HGBA1C 5.6 02/26/2020   No results found for: CHOL, HDL, LDLCALC, LDLDIRECT, TRIG, CHOLHDL  Significant Diagnostic Results in last 30 days:  No results found.  Assessment/Plan    Family/ staff Communication:   Labs/tests ordered:    Goals of care:      Durenda Age, DNP, MSN, FNP-BC [facility] and Adult Medicine [phone] (Monday-Friday 8:00 a.m. - 5:00 p.m.) [phone] (after hours)

## 2021-11-12 LAB — CBC: RBC: 5.41 — AB (ref 3.87–5.11)

## 2021-11-12 LAB — BASIC METABOLIC PANEL
BUN: 16 (ref 4–21)
CO2: 19 (ref 13–22)
Chloride: 104 (ref 99–108)
Creatinine: 1.3 (ref 0.6–1.3)
Glucose: 87
Potassium: 4.7 (ref 3.4–5.3)
Sodium: 139 (ref 137–147)

## 2021-11-12 LAB — HEPATIC FUNCTION PANEL
ALT: 12 (ref 10–40)
AST: 14 (ref 14–40)
Alkaline Phosphatase: 83 (ref 25–125)

## 2021-11-12 LAB — COMPREHENSIVE METABOLIC PANEL
Albumin: 4.1 (ref 3.5–5.0)
Calcium: 9.4 (ref 8.7–10.7)
GFR calc Af Amer: 64.56
GFR calc non Af Amer: 55.7
Globulin: 3

## 2021-11-12 LAB — CBC AND DIFFERENTIAL
HCT: 52 (ref 41–53)
Hemoglobin: 16.7 (ref 13.5–17.5)
Neutrophils Absolute: 4.3
Platelets: 254 (ref 150–399)
WBC: 8.2

## 2021-12-02 ENCOUNTER — Non-Acute Institutional Stay (SKILLED_NURSING_FACILITY): Payer: Medicare Other | Admitting: Adult Health

## 2021-12-02 ENCOUNTER — Encounter: Payer: Self-pay | Admitting: Adult Health

## 2021-12-02 DIAGNOSIS — F29 Unspecified psychosis not due to a substance or known physiological condition: Secondary | ICD-10-CM

## 2021-12-02 DIAGNOSIS — I10 Essential (primary) hypertension: Secondary | ICD-10-CM | POA: Diagnosis not present

## 2021-12-02 DIAGNOSIS — N1831 Chronic kidney disease, stage 3a: Secondary | ICD-10-CM | POA: Diagnosis not present

## 2021-12-02 DIAGNOSIS — E781 Pure hyperglyceridemia: Secondary | ICD-10-CM | POA: Diagnosis not present

## 2021-12-02 DIAGNOSIS — F01518 Vascular dementia, unspecified severity, with other behavioral disturbance: Secondary | ICD-10-CM

## 2021-12-02 NOTE — Progress Notes (Signed)
Location:  [place] Room Number: [place] of Service:  SNF (31) Provider:  Durenda Age, DNP, FNP-BC  Patient Care Team: Hendricks Limes, MD as PCP - General (Internal Medicine)  Extended Emergency Contact Information Primary Emergency Contact: Sharion Dove of [place] Mobile Phone: [phone] Relation: Son Secondary Emergency Contact: Haywood Filler Home Phone: [phone] Relation: Relative  Code Status:  FULL CODE  Goals of care: Advanced Directive information Advanced Directives 12/02/2021  Does Patient Have a Medical Advance Directive? No  Does patient want to make changes to medical advance directive? -  Would patient like information on creating a medical advance directive? -     Chief Complaint  Patient presents with   Medical Management of Chronic Issues    Routine follow up visit    HPI:  Pt is a 76 y.o. male seen today for medical management of chronic diseases. He is a long-term care resident of [facility]. He has a PMH of hypertension, dementia, psychosis and chronic kidney disease.  Hypertriglyceridemia  -  TRG 181, elevated, not on lipid controlling agent.  Essential hypertension  -   SBPs ranging from 117-139, he takes metoprolol tartrate 25 mg twice a day, clonidine 0.1 mg/day patch to skin weekly, benazepril 10 mg 1 tab daily and amlodipine 5 mg 1 tab daily  Stage 3a chronic kidney disease (HCC)  -  GFR 55.70 and creatinine 1.26  Psychosis, unspecified psychosis type (HCC) -   has occasional agitation and refusal of medications, takes quetiapine 12.5 mg twice a day    Past Medical History:  Diagnosis Date   Acute respiratory failure with hypoxia (HCC)    Cognitive communication deficit    Constipation    Dysphagia    Essential hypertension    GERD without esophagitis    Gout    History of [id number]    Metabolic encephalopathy    Muscle weakness    Other sequelae  of cerebral infarction    Pneumonia due to COVID-19 virus 2019   History reviewed. No pertinent surgical history.  Allergies  Allergen Reactions   Atorvastatin Itching and Rash    Outpatient Encounter Medications as of 12/02/2021  Medication Sig   acetaminophen (TYLENOL) 325 MG tablet Take 2 tablets (650 mg total) by mouth every 6 (six) hours as needed for mild pain or headache (fever >/= 101).   albuterol (VENTOLIN HFA) 108 (90 Base) MCG/ACT inhaler Inhale 2 puffs into the lungs every 4 (four) hours as needed for wheezing or shortness of breath.   amLODipine (NORVASC) 5 MG tablet Take 5 mg by mouth daily.   aspirin EC 81 MG tablet Take 1 tablet (81 mg total) by mouth daily.   benazepril (LOTENSIN) 10 MG tablet Take 10 mg by mouth daily.   bisacodyl (DULCOLAX) 10 MG suppository Place 10 mg rectally daily as needed for moderate constipation.   cloNIDine (CATAPRES - DOSED IN MG/24 HR) 0.1 mg/24hr patch Place 0.1 mg onto the skin once a week.   magnesium hydroxide (MILK OF MAGNESIA) 400 MG/5ML suspension Take 30 mLs by mouth daily as needed for mild constipation.   metoprolol tartrate (LOPRESSOR) 25 MG tablet Take 25 mg by mouth 2 (two) times daily.   ondansetron (ZOFRAN) 4 MG tablet Take 1 tablet (4 mg total) by mouth every 6 (six) hours as needed for nausea.   polyethylene glycol (MIRALAX / GLYCOLAX) 17 g packet Take 17 g by mouth daily as needed  for mild constipation.   QUEtiapine (SEROQUEL) 25 MG tablet Take 12.5 mg by mouth 2 (two) times daily.   Sodium Phosphates (RA SALINE ENEMA RE) Place 1 Dose rectally as needed.   [DISCONTINUED] UNABLE TO FIND Med Name: Magic Cup once daily to help stabilize weight   No facility-administered encounter medications on file as of 12/02/2021.    Review of Systems  Constitutional:  Negative for activity change, appetite change and fever.  HENT:  Negative for sore throat.   Eyes: Negative.   Cardiovascular:  Negative for chest pain and leg swelling.   Gastrointestinal:  Negative for abdominal distention, diarrhea and vomiting.  Genitourinary:  Negative for dysuria, frequency and urgency.  Skin:  Negative for color change.  Neurological:  Negative for dizziness and headaches.  Psychiatric/Behavioral:  Positive for agitation and behavioral problems. Negative for sleep disturbance. The patient is not nervous/anxious.       Immunization History  Administered Date(s) Administered   Influenza, High Dose Seasonal PF 08/14/2014   Moderna SARS-COV2 Booster Vaccination 01/20/2021   Moderna Sars-Covid-2 Vaccination 05/20/2020, 06/16/2020   Pneumococcal Polysaccharide-23 03/04/2020   Unspecified SARS-COV-2 Vaccination 01/20/2021   Pertinent  Health Maintenance Due  Topic Date Due   INFLUENZA VACCINE  02/12/2022 (Originally 06/15/2021)   COLONOSCOPY (Pts 45-98yrs Insurance coverage will need to be confirmed)  11/11/2022 (Originally 07/26/1992)   Fall Risk 03/02/2020 03/02/2020 03/03/2020 03/03/2020 03/04/2020  Patient Fall Risk Level High fall risk High fall risk High fall risk High fall risk High fall risk     Vitals:   12/02/21 1050  BP: 136/82  Pulse: 79  Resp: 18  Temp: (!) 97.3 F (36.3 C)  Height: 5\' 8"  (1.727 m)   Body mass index is 25.36 kg/m.  Physical Exam Constitutional:      Appearance: Normal appearance.  HENT:     Head: Normocephalic and atraumatic.     Mouth/Throat:     Mouth: Mucous membranes are moist.  Eyes:     Conjunctiva/sclera: Conjunctivae normal.  Cardiovascular:     Rate and Rhythm: Normal rate and regular rhythm.     Pulses: Normal pulses.     Heart sounds: Normal heart sounds.  Pulmonary:     Effort: Pulmonary effort is normal.     Breath sounds: Normal breath sounds.  Abdominal:     General: Bowel sounds are normal.     Palpations: Abdomen is soft.  Musculoskeletal:        General: No swelling. Normal range of motion.     Cervical back: Normal range of motion.  Skin:    General: Skin is warm  and dry.  Neurological:     General: No focal deficit present.     Mental Status: He is alert. He is disoriented.  Psychiatric:        Mood and Affect: Mood normal.       Labs reviewed: Recent Labs    07/02/21 0000 11/12/21 0000  NA 143 139  K 4.5 4.7  CL 104 104  CO2 20 19  BUN 19 16  CREATININE 1.3 1.3  CALCIUM 9.9 9.4   Recent Labs    07/02/21 0000 11/12/21 0000  AST 20 14  ALT 14 12  ALKPHOS  --  83  ALBUMIN 4.6 4.1   Recent Labs    07/02/21 0000 11/12/21 0000  WBC 9.1 8.2  NEUTROABS 4.50 4.30  HGB 16.6 16.7  HCT 50 52  PLT 269 254   Lab Results  Component  Value Date   TSH 1.20 11/27/2020   Lab Results  Component Value Date   HGBA1C 5.6 02/26/2020   No results found for: CHOL, HDL, LDLCALC, LDLDIRECT, TRIG, CHOLHDL  Significant Diagnostic Results in last 30 days:  No results found.  Assessment/Plan  1. Hypertriglyceridemia -  TRG 181, elevated -  allerg withic to atorvastatin -  will re-check lipid panel  2. Essential hypertension -Blood pressure well controlled Continue current medications  3. Stage 3a chronic kidney disease [facility]) Lab Results  Component Value Date   NA 139 11/12/2021   K 4.7 11/12/2021   CO2 19 11/12/2021   GLUCOSE 111 (H) 03/04/2020   BUN 16 11/12/2021   CREATININE 1.3 11/12/2021   CALCIUM 9.4 11/12/2021   GFRNONAA 55.70 11/12/2021   -   Stable  4. Psychosis, unspecified psychosis type ([place]) -    Reported to have episodes of agitation -    Continue Quetiapine 12.5 mg twice a day  5. Vascular dementia with behavior disturbance -   BIMS score 3/15, ranging in severe cognitive impairment -    Continue supportive care    Family/ staff Communication: Discussed plan of care with resident and charge nurse  Labs/tests ordered:   Lipid panel    Durenda Age, DNP, MSN, FNP-BC [facility] and Adult Medicine [phone] (Monday-Friday 8:00 a.m. - 5:00 p.m.) [phone] (after hours)

## 2021-12-11 ENCOUNTER — Encounter: Payer: Self-pay | Admitting: Adult Health

## 2021-12-11 ENCOUNTER — Non-Acute Institutional Stay (SKILLED_NURSING_FACILITY): Payer: Medicare Other | Admitting: Adult Health

## 2021-12-11 DIAGNOSIS — Z7189 Other specified counseling: Secondary | ICD-10-CM | POA: Diagnosis not present

## 2021-12-11 DIAGNOSIS — F01518 Vascular dementia, unspecified severity, with other behavioral disturbance: Secondary | ICD-10-CM

## 2021-12-11 DIAGNOSIS — F39 Unspecified mood [affective] disorder: Secondary | ICD-10-CM

## 2021-12-11 DIAGNOSIS — I10 Essential (primary) hypertension: Secondary | ICD-10-CM | POA: Diagnosis not present

## 2021-12-11 LAB — LIPID PANEL
Cholesterol: 124 (ref 0–200)
HDL: 34 — AB (ref 35–70)
LDL Cholesterol: 65
LDl/HDL Ratio: 3.6
Triglycerides: 125 (ref 40–160)

## 2021-12-11 NOTE — Progress Notes (Signed)
Location:  [place] Room Number: 118-A Place of Service:  SNF (31) Provider:  Durenda Age, DNP, FNP-BC  Patient Care Team: Hendricks Limes, MD as PCP - General (Internal Medicine)  Extended Emergency Contact Information Primary Emergency Contact: Sharion Dove of [place] Mobile Phone: [phone] Relation: Son Secondary Emergency Contact: Haywood Filler Home Phone: [phone] Relation: Relative  Code Status:  Full Code  Goals of care: Advanced Directive information Advanced Directives 12/11/2021  Does Patient Have a Medical Advance Directive? No  Does patient want to make changes to medical advance directive? -  Would patient like information on creating a medical advance directive? No - Patient declined     Chief Complaint  Patient presents with   Acute Visit    Care plan meeting    HPI:  Pt is a 76 y.o. male who was seen for care plan meeting. He declined invitation for the meeting. The care plan meeting was attended by social worker X 2, dietitian, NP and life enrichment. Son was called via teleconference but did not answer. He remains to be full code. Discussed medications, vital signs and weights. He was reported to refuse medications and showers. SBPs has occasional highs at 168, 152. He takes Clonidine 0.1 mg/day patch weekly, Benazepril 10 mg 1 tab PO daily, metoprolol tartrate 25 mg twice a day and Amlodipine 5 mg daily for hypertension. The dosage for Divalproex DR 125 mg was recently increased from daily to BID. 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 He is being followed by psych. The meeting lasted for 20 minutes.   Past Medical History:  Diagnosis Date   Acute respiratory failure with hypoxia (HCC)    Cognitive communication deficit    Constipation    Dysphagia    Essential hypertension    GERD without esophagitis    Gout    History of [id number]    Metabolic encephalopathy     Muscle weakness    Other sequelae of cerebral infarction    Pneumonia due to COVID-19 virus 2019   History reviewed. No pertinent surgical history.  Allergies  Allergen Reactions   Atorvastatin Itching and Rash    Outpatient Encounter Medications as of 12/11/2021  Medication Sig   acetaminophen (TYLENOL) 325 MG tablet Take 2 tablets (650 mg total) by mouth every 6 (six) hours as needed for mild pain or headache (fever >/= 101).   albuterol (VENTOLIN HFA) 108 (90 Base) MCG/ACT inhaler Inhale 2 puffs into the lungs every 4 (four) hours as needed for wheezing or shortness of breath.   amLODipine (NORVASC) 5 MG tablet Take 5 mg by mouth daily.   aspirin EC 81 MG tablet Take 1 tablet (81 mg total) by mouth daily.   benazepril (LOTENSIN) 10 MG tablet Take 10 mg by mouth daily.   bisacodyl (DULCOLAX) 10 MG suppository Place 10 mg rectally daily as needed for moderate constipation.   cloNIDine (CATAPRES - DOSED IN MG/24 HR) 0.1 mg/24hr patch Place 0.1 mg onto the skin once a week.   divalproex (DEPAKOTE) 125 MG DR tablet Take 125 mg by mouth 2 (two) times daily.   magnesium hydroxide (MILK OF MAGNESIA) 400 MG/5ML suspension Take 30 mLs by mouth daily as needed for mild constipation.   metoprolol tartrate (LOPRESSOR) 25 MG tablet Take 25 mg by mouth 2 (two) times daily.   NON FORMULARY DIET: REGULAR, THIN LIQUIDS   ondansetron (ZOFRAN) 4 MG tablet Take 1 tablet (  4 mg total) by mouth every 6 (six) hours as needed for nausea.   polyethylene glycol (MIRALAX / GLYCOLAX) 17 g packet Take 17 g by mouth daily as needed for mild constipation.   QUEtiapine (SEROQUEL) 25 MG tablet Take 12.5 mg by mouth 2 (two) times daily.   Sodium Phosphates (RA SALINE ENEMA RE) Place 1 Dose rectally as needed.   No facility-administered encounter medications on file as of 12/11/2021.    Review of Systems  Constitutional:  Negative for activity change, appetite change and fever.  HENT:  Negative for sore throat.    Eyes: Negative.   Cardiovascular:  Negative for chest pain and leg swelling.  Gastrointestinal:  Negative for abdominal distention, diarrhea and vomiting.  Genitourinary:  Negative for dysuria, frequency and urgency.  Skin:  Negative for color change.  Neurological:  Negative for dizziness and headaches.  Psychiatric/Behavioral:  Positive for agitation and behavioral problems. Negative for sleep disturbance. The patient is not nervous/anxious.       Immunization History  Administered Date(s) Administered   Influenza, High Dose Seasonal PF 08/14/2014   Moderna SARS-COV2 Booster Vaccination 01/20/2021   Moderna Sars-Covid-2 Vaccination 05/20/2020, 06/16/2020   Pneumococcal Polysaccharide-23 03/04/2020   Unspecified SARS-COV-2 Vaccination 01/20/2021   Pertinent  Health Maintenance Due  Topic Date Due   INFLUENZA VACCINE  02/12/2022 (Originally 06/15/2021)   COLONOSCOPY (Pts 45-6yrs Insurance coverage will need to be confirmed)  11/11/2022 (Originally 07/26/1992)   Fall Risk 03/02/2020 03/02/2020 03/03/2020 03/03/2020 03/04/2020  Patient Fall Risk Level High fall risk High fall risk High fall risk High fall risk High fall risk     Vitals:   12/11/21 1104  BP: 129/86  Pulse: 68  Resp: 18  Temp: (!) 97.5 F (36.4 C)  SpO2: 97%  Weight: 165 lb 9.6 oz (75.1 kg)  Height: 5\' 8"  (1.727 m)   Body mass index is 25.18 kg/m.  Physical Exam Constitutional:      General: He is not in acute distress.    Appearance: Normal appearance.  HENT:     Head: Normocephalic and atraumatic.     Mouth/Throat:     Mouth: Mucous membranes are moist.  Eyes:     Conjunctiva/sclera: Conjunctivae normal.  Cardiovascular:     Rate and Rhythm: Normal rate and regular rhythm.     Pulses: Normal pulses.     Heart sounds: Normal heart sounds.  Pulmonary:     Effort: Pulmonary effort is normal.     Breath sounds: Normal breath sounds.  Abdominal:     General: Bowel sounds are normal.     Palpations:  Abdomen is soft.  Musculoskeletal:        General: No swelling. Normal range of motion.     Cervical back: Normal range of motion.  Skin:    General: Skin is warm and dry.  Neurological:     Mental Status: He is alert. Mental status is at baseline. He is disoriented.  Psychiatric:     Comments: Agitated.       Labs reviewed: Recent Labs    07/02/21 0000 11/12/21 0000  NA 143 139  K 4.5 4.7  CL 104 104  CO2 20 19  BUN 19 16  CREATININE 1.3 1.3  CALCIUM 9.9 9.4   Recent Labs    07/02/21 0000 11/12/21 0000  AST 20 14  ALT 14 12  ALKPHOS  --  83  ALBUMIN 4.6 4.1   Recent Labs    07/02/21 0000 11/12/21 0000  WBC 9.1 8.2  NEUTROABS 4.50 4.30  HGB 16.6 16.7  HCT 50 52  PLT 269 254   Lab Results  Component Value Date   TSH 1.20 11/27/2020   Lab Results  Component Value Date   HGBA1C 5.6 02/26/2020   No results found for: CHOL, HDL, LDLCALC, LDLDIRECT, TRIG, CHOLHDL  Significant Diagnostic Results in last 30 days:  No results found.  Assessment/Plan  1. 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 Essential hypertension -   Continue clonidine, benazepril, metoprolol tartrate and amlodipine  4. Vascular dementia with behavior disturbance -  BIMS score 3/15, ranging in severe cognitive impairment -   Continue supportive care   Family/ staff Communication: Discussed plan of care with IDT.  Labs/tests ordered:   None    Durenda Age, DNP, MSN, FNP-BC [facility] and Adult Medicine [phone] (Monday-Friday 8:00 a.m. - 5:00 p.m.) [phone] (after hours)

## 2021-12-24 ENCOUNTER — Non-Acute Institutional Stay (SKILLED_NURSING_FACILITY): Payer: Medicare Other | Admitting: Adult Health

## 2021-12-24 ENCOUNTER — Encounter: Payer: Self-pay | Admitting: Adult Health

## 2021-12-24 DIAGNOSIS — F01518 Vascular dementia, unspecified severity, with other behavioral disturbance: Secondary | ICD-10-CM | POA: Diagnosis not present

## 2021-12-24 DIAGNOSIS — Z20822 Contact with and (suspected) exposure to covid-19: Secondary | ICD-10-CM | POA: Diagnosis not present

## 2021-12-24 NOTE — Progress Notes (Signed)
Location:  [place] [facility] Room Number: 118-A Place of Service:  SNF (31) Provider:  Durenda Age, DNP, FNP-BC  Patient Care Team: Hendricks Limes, MD as PCP - General (Internal Medicine)  Extended Emergency Contact Information Primary Emergency Contact: Sharion Dove of [place] Mobile Phone: [phone] Relation: Son Secondary Emergency Contact: Haywood Filler Home Phone: [phone] Relation: Relative  Code Status:  DNR  Goals of care: Advanced Directive information Advanced Directives 12/24/2021  Does Patient Have a Medical Advance Directive? No  Does patient want to make changes to medical advance directive? -  Would patient like information on creating a medical advance directive? No - Patient declined     Chief Complaint  Patient presents with   Acute Visit    COVID 19 exposure     HPI:  Pt is a 76 y.o. male seen today for an acute visit due to COVID-19 exposure.  Roommate tested positive for COVID-19. He refused testing for COVID-19. He has nasal congestion, no fever nor rhinorrhea. He denies joint pain. He has BIMS score of 3/15, ranging in severe cognitive impairment. He has PMH of hypertension, dementia, psychosis and chronic kidney disease.  Past Medical History:  Diagnosis Date   Acute respiratory failure with hypoxia (HCC)    Cognitive communication deficit    Constipation    Dysphagia    Essential hypertension    GERD without esophagitis    Gout    History of [id number]    Metabolic encephalopathy    Muscle weakness    Other sequelae of cerebral infarction    Pneumonia due to COVID-19 virus 2019   History reviewed. No pertinent surgical history.  Allergies  Allergen Reactions   Atorvastatin Itching and Rash    Outpatient Encounter Medications as of 12/24/2021  Medication Sig   acetaminophen (TYLENOL) 325 MG tablet Take 2 tablets (650 mg total) by mouth every 6 (six) hours as needed for mild pain or  headache (fever >/= 101).   albuterol (VENTOLIN HFA) 108 (90 Base) MCG/ACT inhaler Inhale 2 puffs into the lungs every 4 (four) hours as needed for wheezing or shortness of breath.   amLODipine (NORVASC) 5 MG tablet Take 5 mg by mouth daily.   aspirin EC 81 MG tablet Take 1 tablet (81 mg total) by mouth daily.   benazepril (LOTENSIN) 10 MG tablet Take 10 mg by mouth daily.   bisacodyl (DULCOLAX) 10 MG suppository Place 10 mg rectally daily as needed for moderate constipation.   cloNIDine (CATAPRES - DOSED IN MG/24 HR) 0.1 mg/24hr patch Place 0.1 mg onto the skin once a week.   divalproex (DEPAKOTE) 125 MG DR tablet Take 125 mg by mouth 2 (two) times daily.   magnesium hydroxide (MILK OF MAGNESIA) 400 MG/5ML suspension Take 30 mLs by mouth daily as needed for mild constipation.   metoprolol tartrate (LOPRESSOR) 25 MG tablet Take 25 mg by mouth 2 (two) times daily.   NON FORMULARY DIET: REGULAR, THIN LIQUIDS   ondansetron (ZOFRAN) 4 MG tablet Take 1 tablet (4 mg total) by mouth every 6 (six) hours as needed for nausea.   polyethylene glycol (MIRALAX / GLYCOLAX) 17 g packet Take 17 g by mouth daily as needed for mild constipation.   QUEtiapine (SEROQUEL) 25 MG tablet Take 12.5 mg by mouth 2 (two) times daily.   Sodium Phosphates (RA SALINE ENEMA RE) Place 1 Dose rectally as needed.   No facility-administered encounter medications on file as of 12/24/2021.  Review of Systems  Constitutional:  Negative for activity change, appetite change and fever.  HENT:  Negative for sore throat.   Eyes: Negative.   Cardiovascular:  Negative for chest pain and leg swelling.  Gastrointestinal:  Negative for abdominal distention, diarrhea and vomiting.  Genitourinary:  Negative for dysuria, frequency and urgency.  Skin:  Negative for color change.  Neurological:  Negative for dizziness and headaches.  Psychiatric/Behavioral:  Positive for agitation. Negative for behavioral problems and sleep disturbance. The  patient is not nervous/anxious.       Immunization History  Administered Date(s) Administered   Influenza, High Dose Seasonal PF 08/14/2014   Moderna SARS-COV2 Booster Vaccination 01/20/2021   Moderna Sars-Covid-2 Vaccination 05/20/2020, 06/16/2020   Pneumococcal Polysaccharide-23 03/04/2020   Unspecified SARS-COV-2 Vaccination 01/20/2021   Pertinent  Health Maintenance Due  Topic Date Due   INFLUENZA VACCINE  02/12/2022 (Originally 06/15/2021)   COLONOSCOPY (Pts 45-4yrs Insurance coverage will need to be confirmed)  11/11/2022 (Originally 07/26/1992)   Fall Risk 03/02/2020 03/02/2020 03/03/2020 03/03/2020 03/04/2020  Patient Fall Risk Level High fall risk High fall risk High fall risk High fall risk High fall risk     Vitals:   12/24/21 1314  BP: (!) 133/96  Pulse: 94  Resp: 18  Temp: (!) 97.5 F (36.4 C)  SpO2: 97%  Weight: 165 lb 3.2 oz (74.9 kg)  Height: 5\' 8"  (1.727 m)   Body mass index is 25.12 kg/m.  Physical Exam Constitutional:      Appearance: Normal appearance.  HENT:     Head: Normocephalic and atraumatic.     Nose: Congestion present. No rhinorrhea.     Mouth/Throat:     Mouth: Mucous membranes are moist.  Eyes:     Conjunctiva/sclera: Conjunctivae normal.  Cardiovascular:     Rate and Rhythm: Normal rate and regular rhythm.     Pulses: Normal pulses.     Heart sounds: Normal heart sounds.  Pulmonary:     Effort: Pulmonary effort is normal.     Breath sounds: Normal breath sounds.  Abdominal:     General: Bowel sounds are normal.     Palpations: Abdomen is soft.  Musculoskeletal:        General: No swelling. Normal range of motion.     Cervical back: Normal range of motion.  Skin:    General: Skin is warm and dry.  Neurological:     General: No focal deficit present.     Mental Status: He is alert. Mental status is at baseline. He is disoriented.  Psychiatric:        Mood and Affect: Mood normal.       Labs reviewed: Recent Labs     07/02/21 0000 11/12/21 0000  NA 143 139  K 4.5 4.7  CL 104 104  CO2 20 19  BUN 19 16  CREATININE 1.3 1.3  CALCIUM 9.9 9.4   Recent Labs    07/02/21 0000 11/12/21 0000  AST 20 14  ALT 14 12  ALKPHOS  --  83  ALBUMIN 4.6 4.1   Recent Labs    07/02/21 0000 11/12/21 0000  WBC 9.1 8.2  NEUTROABS 4.50 4.30  HGB 16.6 16.7  HCT 50 52  PLT 269 254   Lab Results  Component Value Date   TSH 1.20 11/27/2020   Lab Results  Component Value Date   HGBA1C 5.6 02/26/2020   Lab Results  Component Value Date   CHOL 124 12/11/2021   HDL 34 (  A) 12/11/2021   LDLCALC 65 12/11/2021   TRIG 125 12/11/2021    Significant Diagnostic Results in last 30 days:  No results found.  Assessment/Plan  1. Exposure to confirmed case of COVID-19 -  has nasal congestion and roommate tested positive to COVID-19 -  will start on Molnupiravir 800 mg Q 12 hours X 5 days  2. Vascular dementia with behavior disturbance -   BIMS score 3/15, ranging in severe cognitive impairment -   Continue supportive care    Family/ staff Communication: Discussed plan of care with resident and charge nurse.  Labs/tests ordered:   CBC, BMP with GFR, D-dimer, CRP and chest x-ray PA and lateral    Durenda Age, DNP, MSN, FNP-BC [place] [phone] (Monday-Friday 8:00 a.m. - 5:00 p.m.) [phone] (after hours)

## 2021-12-25 LAB — CBC AND DIFFERENTIAL
HCT: 50 (ref 41–53)
Hemoglobin: 16.7 (ref 13.5–17.5)
Platelets: 214 (ref 150–399)
WBC: 7

## 2021-12-25 LAB — BASIC METABOLIC PANEL
BUN: 14 (ref 4–21)
CO2: 23 — AB (ref 13–22)
Chloride: 104 (ref 99–108)
Creatinine: 1 (ref 0.6–1.3)
Glucose: 87
Potassium: 4.2 (ref 3.4–5.3)
Sodium: 139 (ref 137–147)

## 2021-12-25 LAB — COMPREHENSIVE METABOLIC PANEL: Calcium: 9.2 (ref 8.7–10.7)

## 2021-12-25 LAB — CBC: RBC: 5.35 — AB (ref 3.87–5.11)

## 2021-12-28 ENCOUNTER — Non-Acute Institutional Stay (SKILLED_NURSING_FACILITY): Payer: Medicare Other | Admitting: Internal Medicine

## 2021-12-28 ENCOUNTER — Encounter: Payer: Self-pay | Admitting: Internal Medicine

## 2021-12-28 DIAGNOSIS — J9601 Acute respiratory failure with hypoxia: Secondary | ICD-10-CM | POA: Diagnosis not present

## 2021-12-28 DIAGNOSIS — F0390 Unspecified dementia without behavioral disturbance: Secondary | ICD-10-CM

## 2021-12-28 DIAGNOSIS — N1831 Chronic kidney disease, stage 3a: Secondary | ICD-10-CM | POA: Diagnosis not present

## 2021-12-28 DIAGNOSIS — Z9189 Other specified personal risk factors, not elsewhere classified: Secondary | ICD-10-CM

## 2021-12-28 NOTE — Progress Notes (Signed)
NURSING HOME LOCATION:  [facility] ROOM NUMBER:  118 A  CODE STATUS:  Full Code  PCP:  Durenda Age NP,PSC  This is a nursing facility follow up visit of chronic medical diagnoses & to document compliance with Regulation 483.30 (c) in The [place] Manual Phase 2 which mandates caregiver visit ( visits can alternate among physician, PA or NP as per statutes) within 10 days of 30 days / 60 days/ 90 days post admission to SNF date    Interim medical record and care since last SNF visit was updated with review of diagnostic studies and change in clinical status since last visit were documented.  HPI: He is a permanent resident of the facility with medical diagnoses of essential hypertension, history of acute respiratory failure with hypoxia, GERD, history of gout, and history of cerebral infarction with cognitive communication deficits. His roommate tested positive for COVID last week; patient refused COVID screening.  He has had 2 vaccines. Because of clinical congestion; chest x-ray was completed 08/16/2022.  This this was reported as revealing blunting of the costophrenic angles with slightly enlarged cardiac silhouette.  Film was personally reviewed and reveals poor inspiration which can impact the cardiac silhouette.  There is no definite infiltrate or significant effusion.  The lateral film was underexposed and uninterpretable.  D-dimer was 21.48; Eliquis was initiated.  C-reactive protein was 3.29.  White blood count was normal at 7000.  H/H were normal with values of 16.7/49.5. Lipids were completed 12/11/2021 and revealed an LDL of 65 which is at goal of less than 70. On 12/29 /22 Creatinine was 1.3 with GFR of 55.7 indicating CKD stage IIIa.    Review of systems: Dementia invalidated responses. He denies any active symptoms.   Constitutional: No fever, significant weight change, fatigue  Eyes: No redness, discharge, pain, vision  change ENT/mouth: No nasal congestion,  purulent discharge, earache, change in hearing, sore throat  Cardiovascular: No chest pain, palpitations, paroxysmal nocturnal dyspnea, claudication, edema  Respiratory: No cough, sputum production, hemoptysis, DOE, significant snoring, apnea   Gastrointestinal: No heartburn, dysphagia, abdominal pain, nausea /vomiting, rectal bleeding, melena, change in bowels Genitourinary: No dysuria, hematuria, pyuria, incontinence, nocturia Musculoskeletal: No joint stiffness, joint swelling, weakness, pain Dermatologic: No rash, pruritus, change in appearance of skin Neurologic: No dizziness, headache, syncope, seizures, numbness, tingling Psychiatric: No significant anxiety, depression, insomnia, anorexia Endocrine: No change in hair/skin/nails, excessive thirst, excessive hunger, excessive urination  Hematologic/lymphatic: No significant bruising, lymphadenopathy, abnormal bleeding Allergy/immunology: No itchy/watery eyes, significant sneezing, urticaria, angioedema  Physical exam:  Pertinent or positive findings: Initially he was sitting in wheelchair and eating lunch . He became agitated when I attempted to interview him & attempted to leave the room. I explained why I needed to check him and he finally acquiesced. He answered the review of systems with a brusque "no" even before I could finish the questions.  The T-shirt he is wearing is on backwards.  Shoes are untied. Hair is disheveled and mustache and beard unkempt.  Lids are puffy.  Teeth are coated.  He is missing multiple teeth of the mandible and maxilla.  Breath sounds are decreased.  Slight tachycardia is present.  Abdomen is mildly protuberant.  Pedal pulses are decreased.  He has sclerotic changes over the shins.  There is limb atrophy in the calf region.  General appearance:  no acute distress, increased work of breathing is present.   Lymphatic: No lymphadenopathy about the head,  neck, axilla. Eyes:  No conjunctival inflammation or lid edema is present. There is no scleral icterus. Ears:  External ear exam shows no significant lesions or deformities.   Nose:  External nasal examination shows no deformity or inflammation. Nasal mucosa are pink and moist without lesions, exudates Neck:  No thyromegaly, masses, tenderness noted.    Heart:  No murmur, click, rub .  Lungs:  without wheezes, rhonchi, rales, rubs. Abdomen: Bowel sounds are normal. Abdomen is soft and nontender with no organomegaly, hernias, masses. GU: Deferred  Extremities:  No cyanosis, clubbing, edema  Neurologic exam :Balance, Rhomberg, finger to nose testing could not be completed due to clinical state Skin: Warm & dry w/o tenting. No significant rash.  See summary under each active problem in the Problem List with associated updated therapeutic plan

## 2021-12-28 NOTE — Assessment & Plan Note (Addendum)
He refused COVID screen last week even though roommate tested positive. Today became very agitated when I attempted to examine him, pushing my hands away and only acquiesced reluctantly.

## 2021-12-28 NOTE — Assessment & Plan Note (Signed)
Despite exposure to roommate who screened positive for COVID; he exhibits no respiratory distress and O2 sats are 98% on room air.  Chest x-ray revealed poor inspiration with no definite acute process.

## 2021-12-28 NOTE — Patient Instructions (Signed)
See assessment and plan under each diagnosis in the problem list and acutely for this visit 

## 2021-12-28 NOTE — Assessment & Plan Note (Addendum)
We will have to monitor his clinical status as he can provide no meaningful history.  Eliquis initiated reluctantly as he is at high risk for falls but the extremely high D-dimer suggest extremely high risk of coagulopathy & associated DVT or PTE. As noted O2 sats 98% on RA.

## 2021-12-28 NOTE — Assessment & Plan Note (Addendum)
Current renal function reveals a creatinine of 0.97 with GFR of 76.36 indicating CKD stage II.  No change in present medication regimen indicated.

## 2022-01-05 ENCOUNTER — Non-Acute Institutional Stay (SKILLED_NURSING_FACILITY): Payer: Medicare Other | Admitting: Adult Health

## 2022-01-05 ENCOUNTER — Encounter: Payer: Self-pay | Admitting: Adult Health

## 2022-01-05 DIAGNOSIS — F01518 Vascular dementia, unspecified severity, with other behavioral disturbance: Secondary | ICD-10-CM

## 2022-01-05 DIAGNOSIS — F39 Unspecified mood [affective] disorder: Secondary | ICD-10-CM | POA: Diagnosis not present

## 2022-01-05 DIAGNOSIS — I10 Essential (primary) hypertension: Secondary | ICD-10-CM | POA: Diagnosis not present

## 2022-01-05 DIAGNOSIS — Z7189 Other specified counseling: Secondary | ICD-10-CM | POA: Diagnosis not present

## 2022-01-05 NOTE — Progress Notes (Signed)
Location:  [facility] Room Number: 118-A Place of Service:  SNF (31) Provider:  Durenda Age, DNP, FNP-BC  Patient Care Team: Hendricks Limes, MD as PCP - General (Internal Medicine)  Extended Emergency Contact Information Primary Emergency Contact: Sharion Dove of [place] Mobile Phone: [phone] Relation: Son Secondary Emergency Contact: Haywood Filler Home Phone: [phone] Relation: Relative  Code Status:  FULL CODE  Goals of care: Advanced Directive information Advanced Directives 01/05/2022  Does Patient Have a Medical Advance Directive? No  Does patient want to make changes to medical advance directive? -  Would patient like information on creating a medical advance directive? No - Patient declined     Chief Complaint  Patient presents with   Acute Visit    Care Plan Meeting.    HPI:  Pt is a 76 y.o. male seen today for care plan meeting attended by social worker, NP and son, Leeland Lovelady, who attended via telephone conference. Resident was invited to the care plan meeting but declined. Son is in the [employer] and currently in [place], [place]. He plans to start the legal proceedings to obtain guardianship of his father. [name] wanted resident to be DNR as the code status. Discussed medications, vital signs and weights. Updated son of resident's resistance to care at times. Son stated that he was reclusive when they were growing up. Resident does not have much friends. Resident is from [place], [place]. He enlisted in the [employer]. Son said that resident met his wife while he was stationed in [place] and that is where son was born. They have been moving all their life, [place], [place] and [place]. 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 He and his wife divorced and is currently living  in [place] with another son, Suezanne Jacquet. Ex-wife tried to call at the facility last year but was unsuccessful. Discussed that if the name of the caller is not on the contact list, then information would not be given due to [place]. Son, [name], plans to visit next month. Real state agencies has been contacting him regarding the resident's house in [place]. The meeting lasted for 20 minutes.   Past Medical History:  Diagnosis Date   Acute respiratory failure with hypoxia (HCC)    Cognitive communication deficit    Constipation    Dysphagia    Essential hypertension    GERD without esophagitis    Gout    History of [id number]    Metabolic encephalopathy    Muscle weakness    Other sequelae of cerebral infarction    Pneumonia due to COVID-19 virus 2019   No past surgical history on file.  Allergies  Allergen Reactions   Atorvastatin Itching and Rash    Outpatient Encounter Medications as of 01/05/2022  Medication Sig   acetaminophen (TYLENOL) 325 MG tablet Take 2 tablets (650 mg total) by mouth every 6 (six) hours as needed for mild pain or headache (fever >/= 101).   albuterol (VENTOLIN HFA) 108 (90 Base) MCG/ACT inhaler Inhale 2 puffs into the lungs every 4 (four) hours as needed for wheezing or shortness of breath.   amLODipine (NORVASC) 5 MG tablet Take 5 mg by mouth daily.   aspirin EC 81 MG tablet Take 1 tablet (81 mg total) by mouth daily.   benazepril (LOTENSIN) 10 MG tablet Take 10  mg by mouth daily.   bisacodyl (DULCOLAX) 10 MG suppository Place 10 mg rectally daily as needed for moderate constipation.   cloNIDine (CATAPRES - DOSED IN MG/24 HR) 0.1 mg/24hr patch Place 0.1 mg onto the skin once a week.   divalproex (DEPAKOTE) 125 MG DR tablet Take 125 mg by mouth 2 (two) times daily.   magnesium hydroxide (MILK OF MAGNESIA) 400 MG/5ML suspension Take 30 mLs by mouth daily as needed for mild constipation.   metoprolol tartrate (LOPRESSOR) 25 MG tablet Take 25 mg by mouth 2 (two) times  daily.   NON FORMULARY DIET: REGULAR, THIN LIQUIDS   ondansetron (ZOFRAN) 4 MG tablet Take 1 tablet (4 mg total) by mouth every 6 (six) hours as needed for nausea.   polyethylene glycol (MIRALAX / GLYCOLAX) 17 g packet Take 17 g by mouth daily as needed for mild constipation.   QUEtiapine (SEROQUEL) 25 MG tablet Take 12.5 mg by mouth 2 (two) times daily.   Sodium Phosphates (RA SALINE ENEMA RE) Place 1 Dose rectally as needed.   No facility-administered encounter medications on file as of 01/05/2022.    Review of Systems  Constitutional:  Negative for activity change, appetite change and fever.  HENT:  Negative for sore throat.   Eyes: Negative.   Cardiovascular:  Negative for chest pain and leg swelling.  Gastrointestinal:  Negative for abdominal distention, diarrhea and vomiting.  Genitourinary:  Negative for dysuria, frequency and urgency.  Skin:  Negative for color change.  Neurological:  Negative for dizziness and headaches.  Psychiatric/Behavioral:  Positive for agitation and behavioral problems. Negative for sleep disturbance. The patient is not nervous/anxious.       Immunization History  Administered Date(s) Administered   Influenza, High Dose Seasonal PF 08/14/2014   Moderna SARS-COV2 Booster Vaccination 01/20/2021   Moderna Sars-Covid-2 Vaccination 05/20/2020, 06/16/2020   Pneumococcal Polysaccharide-23 03/04/2020   Unspecified SARS-COV-2 Vaccination 01/20/2021   Pertinent  Health Maintenance Due  Topic Date Due   INFLUENZA VACCINE  02/12/2022 (Originally 06/15/2021)   COLONOSCOPY (Pts 45-58yrs Insurance coverage will need to be confirmed)  11/11/2022 (Originally 07/26/1992)   Fall Risk 03/02/2020 03/02/2020 03/03/2020 03/03/2020 03/04/2020  Patient Fall Risk Level High fall risk High fall risk High fall risk High fall risk High fall risk     Vitals:   01/05/22 1327  BP: 129/85  Pulse: 75  Resp: 18  Temp: (!) 97.2 F (36.2 C)  Weight: 165 lb 3.2 oz (74.9 kg)   Height: $Remove'5\' 8"'CGPojyn$  (1.727 m)   Body mass index is 25.12 kg/m.  Physical Exam Constitutional:      Appearance: Normal appearance.  HENT:     Head: Normocephalic and atraumatic.     Mouth/Throat:     Mouth: Mucous membranes are moist.  Eyes:     Conjunctiva/sclera: Conjunctivae normal.  Cardiovascular:     Rate and Rhythm: Normal rate and regular rhythm.     Pulses: Normal pulses.     Heart sounds: Normal heart sounds.  Pulmonary:     Effort: Pulmonary effort is normal.     Breath sounds: Normal breath sounds.  Abdominal:     General: Bowel sounds are normal.     Palpations: Abdomen is soft.  Musculoskeletal:        General: No swelling. Normal range of motion.     Cervical back: Normal range of motion.  Skin:    General: Skin is warm and dry.  Neurological:     General: No focal deficit  present.     Mental Status: He is alert. He is disoriented.  Psychiatric:        Mood and Affect: Mood normal.        Behavior: Behavior normal.       Labs reviewed: Recent Labs    07/02/21 0000 11/12/21 0000 12/25/21 0000  NA 143 139 139  K 4.5 4.7 4.2  CL 104 104 104  CO2 20 19 23*  BUN $Re'19 16 14  'oSb$ CREATININE 1.3 1.3 1.0  CALCIUM 9.9 9.4 9.2   Recent Labs    07/02/21 0000 11/12/21 0000  AST 20 14  ALT 14 12  ALKPHOS  --  83  ALBUMIN 4.6 4.1   Recent Labs    07/02/21 0000 11/12/21 0000 12/25/21 0000  WBC 9.1 8.2 7.0  NEUTROABS 4.50 4.30  --   HGB 16.6 16.7 16.7  HCT 50 52 50  PLT 269 254 214   Lab Results  Component Value Date   TSH 1.20 11/27/2020   Lab Results  Component Value Date   HGBA1C 5.6 02/26/2020   Lab Results  Component Value Date   CHOL 124 12/11/2021   HDL 34 (A) 12/11/2021   LDLCALC 65 12/11/2021   TRIG 125 12/11/2021    Significant Diagnostic Results in last 30 days:  No results found.  Assessment/Plan  1. ACP (advance care planning) -    Son would like to make resident's code status to DNR -    Discussed medications, vital  signs and weights  2. 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 Vascular dementia with behavior disturbance -  BIMS score 3/15, ranging in severe cognitive impairment -   Continue supportive care    Family/ staff Communication: Discussed plan of care with son and IDT.  Labs/tests ordered: None    Durenda Age, DNP, MSN, FNP-BC [facility] [phone] (Monday-Friday 8:00 a.m. - 5:00 p.m.) [phone] (after hours)

## 2022-01-19 ENCOUNTER — Encounter: Payer: Self-pay | Admitting: Adult Health

## 2022-01-19 ENCOUNTER — Non-Acute Institutional Stay (SKILLED_NURSING_FACILITY): Payer: Medicare Other | Admitting: Adult Health

## 2022-01-19 DIAGNOSIS — F39 Unspecified mood [affective] disorder: Secondary | ICD-10-CM | POA: Diagnosis not present

## 2022-01-19 DIAGNOSIS — F01518 Vascular dementia, unspecified severity, with other behavioral disturbance: Secondary | ICD-10-CM

## 2022-01-19 DIAGNOSIS — I10 Essential (primary) hypertension: Secondary | ICD-10-CM

## 2022-01-19 NOTE — Progress Notes (Signed)
? ?Location:  [facility] ?Nursing Home Room Number: 118-A ?Place of Service:  SNF (31) ?Provider:  Durenda Age, DNP, FNP-BC ? ?Patient Care Team: ?Hendricks Limes, MD as PCP - General (Internal Medicine) ? ?Extended Emergency Contact Information ?Primary Emergency Contact: Lance Coon ? [place] ?Mobile Phone: [phone] ?Relation: Son ?Secondary Emergency Contact: Haywood Filler ?Home Phone: [phone] ?Relation: Relative ? ?Code Status:  DNR ? ?Goals of care: Advanced Directive information ?Advanced Directives 01/19/2022  ?Does Patient Have a Medical Advance Directive? Yes  ?Type of Advance Directive Out of facility DNR (pink MOST or yellow form)  ?Does patient want to make changes to medical advance directive? No - Patient declined  ?Would patient like information on creating a medical advance directive? -  ?Pre-existing out of facility DNR order (yellow form or pink MOST form) Yellow form placed in chart (order not valid for inpatient use)  ? ? ? ?Chief Complaint  ?Patient presents with  ? Medical Management of Chronic Issues  ?  Routine Visit  ? ? ?HPI:  ?Pt is a 76 y.o. male seen today for medical management of chronic diseases. He is a long-term care resident of [facility]. He has a PMH of hypertension, dementia, psychosis and chronic kidney disease. ? ?Essential hypertension -  SBPs ranging from 105-126, takes clonidine 0.1 mg/daily patch topically weekly, benazepril 10 mg 1 tab daily, metoprolol tartrate 25 mg twice a day and amlodipine 5 mg 1 tab daily ? 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 ?Vascular dementia with behavior disturbance -   BIMS score 3/10, ranging in severe cognitive impairment ? ? ?Past Medical History:  ?Diagnosis Date  ? Acute respiratory failure with hypoxia ([place])   ? Cognitive communication deficit   ?  Constipation   ? Dysphagia   ? Essential hypertension   ? GERD without esophagitis   ? Gout   ? History of COVID-19   ? Metabolic encephalopathy   ? Muscle weakness   ? Other sequelae of cerebral infarction   ? Pneumonia due to COVID-19 virus 2019  ? ?History reviewed. No pertinent surgical history. ? ?Allergies  ?Allergen Reactions  ? Atorvastatin Itching and Rash  ? ? ?Outpatient Encounter Medications as of 01/19/2022  ?Medication Sig  ? acetaminophen (TYLENOL) 325 MG tablet Take 2 tablets (650 mg total) by mouth every 6 (six) hours as needed for mild pain or headache (fever >/= 101).  ? albuterol (VENTOLIN HFA) 108 (90 Base) MCG/ACT inhaler Inhale 2 puffs into the lungs every 4 (four) hours as needed for wheezing or shortness of breath.  ? amLODipine (NORVASC) 5 MG tablet Take 5 mg by mouth daily.  ? aspirin EC 81 MG tablet Take 1 tablet (81 mg total) by mouth daily.  ? benazepril (LOTENSIN) 10 MG tablet Take 10 mg by mouth daily.  ? bisacodyl (DULCOLAX) 10 MG suppository Place 10 mg rectally daily as needed for moderate constipation.  ? cloNIDine (CATAPRES - DOSED IN MG/24 HR) 0.1 mg/24hr patch Place 0.1 mg onto the skin once a week.  ? divalproex (DEPAKOTE) 125 MG DR tablet Take 125 mg by mouth 2 (two) times daily.  ? magnesium hydroxide (MILK OF MAGNESIA) 400 MG/5ML suspension Take 30 mLs by mouth daily as needed for mild constipation.  ? metoprolol tartrate (LOPRESSOR) 25 MG tablet Take 25 mg by mouth 2 (two) times daily.  ?  NON FORMULARY DIET: REGULAR, THIN LIQUIDS  ? ondansetron (ZOFRAN) 4 MG tablet Take 1 tablet (4 mg total) by mouth every 6 (six) hours as needed for nausea.  ? polyethylene glycol (MIRALAX / GLYCOLAX) 17 g packet Take 17 g by mouth daily as needed for mild constipation.  ? QUEtiapine (SEROQUEL) 25 MG tablet Take 12.5 mg by mouth 2 (two) times daily.  ? Sodium Phosphates (RA SALINE ENEMA RE) Place 1 Dose rectally as needed.  ? UNABLE TO FIND Med Name: Magic Cup once daily to help stabilize  weight  ? ?No facility-administered encounter medications on file as of 01/19/2022.  ? ? ?Review of Systems  ?Constitutional:  Negative for activity change, appetite change and fever.  ?HENT:  Negative for sore throat.   ?Eyes: Negative.   ?Cardiovascular:  Negative for chest pain and leg swelling.  ?Gastrointestinal:  Negative for abdominal distention, diarrhea and vomiting.  ?Genitourinary:  Negative for dysuria, frequency and urgency.  ?Skin:  Negative for color change.  ?Neurological:  Negative for dizziness and headaches.  ?Psychiatric/Behavioral:  Positive for agitation. Negative for behavioral problems and sleep disturbance. The patient is not nervous/anxious.    ? ? ? ?Immunization History  ?Administered Date(s) Administered  ? Influenza, High Dose Seasonal PF 08/14/2014  ? Moderna SARS-COV2 Booster Vaccination 01/20/2021  ? Moderna Sars-Covid-2 Vaccination 05/20/2020, 06/16/2020  ? Pneumococcal Polysaccharide-23 03/04/2020  ? Unspecified SARS-COV-2 Vaccination 01/20/2021  ? ?Pertinent  Health Maintenance Due  ?Topic Date Due  ? INFLUENZA VACCINE  02/12/2022 (Originally 06/15/2021)  ? COLONOSCOPY (Pts 45-56yrs Insurance coverage will need to be confirmed)  11/11/2022 (Originally 07/26/1992)  ? ?Fall Risk 03/02/2020 03/02/2020 03/03/2020 03/03/2020 03/04/2020  ?Patient Fall Risk Level High fall risk High fall risk High fall risk High fall risk High fall risk  ? ? ? ?Vitals:  ? 01/19/22 1127  ?BP: 105/61  ?Pulse: 83  ?Resp: 18  ?Temp: (!) 97.5 ?F (36.4 ?C)  ?Weight: 163 lb 9.6 oz (74.2 kg)  ?Height: 5\' 8"  (1.727 m)  ? ?Body mass index is 24.88 kg/m?. ? ?Physical Exam ?Constitutional:   ?   Appearance: Normal appearance.  ?HENT:  ?   Head: Normocephalic and atraumatic.  ?   Mouth/Throat:  ?   Mouth: Mucous membranes are moist.  ?Eyes:  ?   Conjunctiva/sclera: Conjunctivae normal.  ?Cardiovascular:  ?   Rate and Rhythm: Normal rate and regular rhythm.  ?   Pulses: Normal pulses.  ?   Heart sounds: Normal heart sounds.   ?Pulmonary:  ?   Effort: Pulmonary effort is normal.  ?   Breath sounds: Normal breath sounds.  ?Abdominal:  ?   General: Bowel sounds are normal.  ?   Palpations: Abdomen is soft.  ?Musculoskeletal:     ?   General: No swelling. Normal range of motion.  ?   Cervical back: Normal range of motion.  ?Skin: ?   General: Skin is warm and dry.  ?Neurological:  ?   Mental Status: He is alert. Mental status is at baseline. He is disoriented.  ?Psychiatric:     ?   Mood and Affect: Mood normal.     ?   Behavior: Behavior normal.  ?  ? ? ? ?Labs reviewed: ?Recent Labs  ?  07/02/21 ?0000 11/12/21 ?0000 12/25/21 ?0000  ?NA 143 139 139  ?K 4.5 4.7 4.2  ?CL 104 104 104  ?CO2 20 19 23*  ?BUN 19 16 14   ?CREATININE 1.3 1.3 1.0  ?CALCIUM  9.9 9.4 9.2  ? ?Recent Labs  ?  07/02/21 ?0000 11/12/21 ?0000  ?AST 20 14  ?ALT 14 12  ?ALKPHOS  --  83  ?ALBUMIN 4.6 4.1  ? ?Recent Labs  ?  07/02/21 ?0000 11/12/21 ?0000 12/25/21 ?0000  ?WBC 9.1 8.2 7.0  ?NEUTROABS 4.50 4.30  --   ?HGB 16.6 16.7 16.7  ?HCT 50 52 50  ?PLT 269 254 214  ? ?Lab Results  ?Component Value Date  ? TSH 1.20 11/27/2020  ? ?Lab Results  ?Component Value Date  ? HGBA1C 5.6 02/26/2020  ? ?Lab Results  ?Component Value Date  ? CHOL 124 12/11/2021  ? HDL 34 (A) 12/11/2021  ? [place] 65 12/11/2021  ? TRIG 125 12/11/2021  ? ? ?Significant Diagnostic Results in last 30 days:  ?No results found. ? ?Assessment/Plan ? ?1. Essential hypertension ?-Blood pressure well controlled ?Continue current medications ? ?2. 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 ?3. Vascular dementia with behavior disturbance ?-  BIMS score 3/15, ranging in severe cognitive impairment ?-    Continue supportive care ? ? ?Family/ staff Communication: Discussed plan of care with resident and charge nurse. ? ?Labs/tests ordered: None ? ? ? ?Durenda Age, DNP, MSN, FNP-BC ?[facility] ?[phone] (Monday-Friday 8:00 a.m. - 5:00  p.m.) ?[phone] (after hours) ? ?

## 2022-02-22 ENCOUNTER — Non-Acute Institutional Stay (SKILLED_NURSING_FACILITY): Payer: Medicare Other | Admitting: Adult Health

## 2022-02-22 ENCOUNTER — Encounter: Payer: Self-pay | Admitting: Adult Health

## 2022-02-22 DIAGNOSIS — F01518 Vascular dementia, unspecified severity, with other behavioral disturbance: Secondary | ICD-10-CM

## 2022-02-22 DIAGNOSIS — F39 Unspecified mood [affective] disorder: Secondary | ICD-10-CM | POA: Diagnosis not present

## 2022-02-22 DIAGNOSIS — I10 Essential (primary) hypertension: Secondary | ICD-10-CM

## 2022-02-22 NOTE — Progress Notes (Signed)
? ?Location:  [facility] ?Nursing Home Room Number: 118-A ?Place of Service:  SNF (31) ?Provider:  Kenard Gower, DNP, FNP-BC ? ?Patient Care Team: ?Pecola Lawless, MD as PCP - General (Internal Medicine) ? ?Extended Emergency Contact Information ?Primary Emergency Contact: Dillard Cannon ? [place] ?Mobile Phone: [phone] ?Relation: Son ?Secondary Emergency Contact: Gillermina Hu ?Home Phone: [phone] ?Relation: Relative ? ?Code Status:  DNR ? ?Goals of care: Advanced Directive information ? ?  02/22/2022  ?  3:40 PM  ?Advanced Directives  ?Does Patient Have a Medical Advance Directive? Yes  ?Type of Advance Directive Out of facility DNR (pink MOST or yellow form)  ?Does patient want to make changes to medical advance directive? No - Patient declined  ?Pre-existing out of facility DNR order (yellow form or pink MOST form) Yellow form placed in chart (order not valid for inpatient use)  ? ? ? ?Chief Complaint  ?Patient presents with  ? Medical Management of Chronic Issues  ?  Routine Visit  ? ? ?HPI:  ?Pt is a 76 y.o. male seen today for medical management of chronic diseases. He is a long-term care resident of [facility]. He has a PMH of hypertension, dementia, psychosis and chronic kidney disease.  ? ?Mood disorder with psychosis (HCC) -   No reported agitation. He takes Quetiapine 12.5 mg BID and Divalproex DR 125 1 capsule BID ? ?Essential hypertension -  SBPs ranging from 108 to 122, with outlier 97 and 160. She takes Metoprolol tartrate 25 mg BID, Clonidine 0.1 mg/day 1 patch weekly, Benazepril 10 mg daily and Amlodipine 5 mg daily ? ?Vascular dementia with behavior disturbance (HCC)  -  BIMS score 3/15, ranging in severe cognitive impairment ? ? ? ?Past Medical History:  ?Diagnosis Date  ? Acute respiratory failure with hypoxia (HCC)   ? Cognitive communication deficit   ? Constipation   ? Dysphagia   ? Essential hypertension   ? GERD without  esophagitis   ? Gout   ? History of COVID-19   ? Metabolic encephalopathy   ? Muscle weakness   ? Other sequelae of cerebral infarction   ? Pneumonia due to COVID-19 virus 2019  ? ?History reviewed. No pertinent surgical history. ? ?Allergies  ?Allergen Reactions  ? Atorvastatin Itching and Rash  ? ? ?Outpatient Encounter Medications as of 02/22/2022  ?Medication Sig  ? acetaminophen (TYLENOL) 325 MG tablet Take 2 tablets (650 mg total) by mouth every 6 (six) hours as needed for mild pain or headache (fever >/= 101).  ? albuterol (VENTOLIN HFA) 108 (90 Base) MCG/ACT inhaler Inhale 2 puffs into the lungs every 4 (four) hours as needed for wheezing or shortness of breath.  ? amLODipine (NORVASC) 5 MG tablet Take 5 mg by mouth daily.  ? aspirin EC 81 MG tablet Take 1 tablet (81 mg total) by mouth daily.  ? benazepril (LOTENSIN) 10 MG tablet Take 10 mg by mouth daily.  ? bisacodyl (DULCOLAX) 10 MG suppository Place 10 mg rectally daily as needed for moderate constipation.  ? cloNIDine (CATAPRES - DOSED IN MG/24 HR) 0.1 mg/24hr patch Place 0.1 mg onto the skin once a week.  ? divalproex (DEPAKOTE) 125 MG DR tablet Take 125 mg by mouth 2 (two) times daily.  ? magnesium hydroxide (MILK OF MAGNESIA) 400 MG/5ML suspension Take 30 mLs by mouth daily as needed for mild constipation.  ? metoprolol tartrate (LOPRESSOR) 25 MG tablet Take 25 mg by mouth 2 (two) times  daily.  ? NON FORMULARY DIET: REGULAR, THIN LIQUIDS  ? ondansetron (ZOFRAN) 4 MG tablet Take 1 tablet (4 mg total) by mouth every 6 (six) hours as needed for nausea.  ? polyethylene glycol (MIRALAX / GLYCOLAX) 17 g packet Take 17 g by mouth daily as needed for mild constipation.  ? QUEtiapine (SEROQUEL) 25 MG tablet Take 12.5 mg by mouth 2 (two) times daily.  ? Sodium Phosphates (RA SALINE ENEMA RE) Place 1 Dose rectally as needed.  ? UNABLE TO FIND Med Name: Magic Cup once daily to help stabilize weight  ? ?No facility-administered encounter medications on file as of  02/22/2022.  ? ? ?Review of Systems  ?Constitutional:  Negative for activity change, appetite change and fever.  ?HENT:  Negative for sore throat.   ?Eyes: Negative.   ?Cardiovascular:  Negative for chest pain and leg swelling.  ?Gastrointestinal:  Negative for abdominal distention, diarrhea and vomiting.  ?Genitourinary:  Negative for dysuria, frequency and urgency.  ?Skin:  Negative for color change.  ?Neurological:  Negative for dizziness and headaches.  ?Psychiatric/Behavioral:  Negative for behavioral problems and sleep disturbance. The patient is not nervous/anxious.    ? ? ? ?Immunization History  ?Administered Date(s) Administered  ? Influenza, High Dose Seasonal PF 08/14/2014  ? Moderna SARS-COV2 Booster Vaccination 01/20/2021  ? Moderna Sars-Covid-2 Vaccination 05/20/2020, 06/16/2020  ? Pneumococcal Polysaccharide-23 03/04/2020  ? Unspecified SARS-COV-2 Vaccination 01/20/2021  ? ?Pertinent  Health Maintenance Due  ?Topic Date Due  ? COLONOSCOPY (Pts 45-3718yrs Insurance coverage will need to be confirmed)  11/11/2022 (Originally 07/26/1992)  ? INFLUENZA VACCINE  06/15/2022  ? ? ?  03/02/2020  ?  9:00 AM 03/02/2020  ?  8:00 PM 03/03/2020  ?  8:50 AM 03/03/2020  ?  8:07 PM 03/04/2020  ?  7:18 AM  ?Fall Risk  ?Patient Fall Risk Level High fall risk High fall risk High fall risk High fall risk High fall risk  ? ? ? ?Vitals:  ? 02/22/22 1537  ?BP: 124/77  ?Pulse: 83  ?Resp: 19  ?Temp: (!) 97.1 ?F (36.2 ?C)  ?Weight: 165 lb 3.2 oz (74.9 kg)  ?Height: 5\' 8"  (1.727 m)  ? ?Body mass index is 25.12 kg/m?. ? ?Physical Exam ?Constitutional:   ?   General: He is not in acute distress. ?   Appearance: Normal appearance.  ?HENT:  ?   Head: Normocephalic and atraumatic.  ?   Mouth/Throat:  ?   Mouth: Mucous membranes are moist.  ?Eyes:  ?   Conjunctiva/sclera: Conjunctivae normal.  ?Cardiovascular:  ?   Rate and Rhythm: Normal rate and regular rhythm.  ?   Pulses: Normal pulses.  ?   Heart sounds: Normal heart sounds.  ?Pulmonary:   ?   Effort: Pulmonary effort is normal.  ?   Breath sounds: Normal breath sounds.  ?Abdominal:  ?   General: Bowel sounds are normal.  ?   Palpations: Abdomen is soft.  ?Musculoskeletal:     ?   General: No swelling. Normal range of motion.  ?   Cervical back: Normal range of motion.  ?Skin: ?   General: Skin is warm and dry.  ?Neurological:  ?   Mental Status: He is alert. Mental status is at baseline. He is disoriented.  ?   Comments: Alert to self, disoriented to time and place.  ?Psychiatric:     ?   Mood and Affect: Mood normal.     ?   Behavior: Behavior normal.  ?  ? ? ? ?  Labs reviewed: ?Recent Labs  ?  07/02/21 ?0000 11/12/21 ?0000 12/25/21 ?0000  ?NA 143 139 139  ?K 4.5 4.7 4.2  ?CL 104 104 104  ?CO2 20 19 23*  ?BUN 19 16 14   ?CREATININE 1.3 1.3 1.0  ?CALCIUM 9.9 9.4 9.2  ? ?Recent Labs  ?  07/02/21 ?0000 11/12/21 ?0000  ?AST 20 14  ?ALT 14 12  ?ALKPHOS  --  83  ?ALBUMIN 4.6 4.1  ? ?Recent Labs  ?  07/02/21 ?0000 11/12/21 ?0000 12/25/21 ?0000  ?WBC 9.1 8.2 7.0  ?NEUTROABS 4.50 4.30  --   ?HGB 16.6 16.7 16.7  ?HCT 50 52 50  ?PLT 269 254 214  ? ?Lab Results  ?Component Value Date  ? TSH 1.20 11/27/2020  ? ?Lab Results  ?Component Value Date  ? HGBA1C 5.6 02/26/2020  ? ?Lab Results  ?Component Value Date  ? CHOL 124 12/11/2021  ? HDL 34 (A) 12/11/2021  ? LDLCALC 65 12/11/2021  ? TRIG 125 12/11/2021  ? ? ?Significant Diagnostic Results in last 30 days:  ?No results found. ? ?Assessment/Plan ? ?1. Mood disorder with psychosis (HCC) ?-  mood is stable, continue Divalproex DR and Quetiapine ?-  followed by psych ? ?2. Essential hypertension ?-Blood pressure well controlled ?Continue current medications ? ?3. Vascular dementia with behavior disturbance (HCC) ?-  has severe cognitive impairment, continue supportive care ? ? ? ?Family/ staff Communication: Discussed plan of care with resident and charge nurse. ? ?Labs/tests ordered:  None ? ? ? ?12/13/2021, DNP, MSN, FNP-BC ?[facility] ?[phone] (Monday-Friday 8:00 a.m. - 5:00 p.m.) ?[phone] (after hours) ? ?

## 2022-03-02 ENCOUNTER — Encounter: Payer: Self-pay | Admitting: Adult Health

## 2022-03-02 ENCOUNTER — Non-Acute Institutional Stay (SKILLED_NURSING_FACILITY): Payer: Medicare Other | Admitting: Adult Health

## 2022-03-02 DIAGNOSIS — F39 Unspecified mood [affective] disorder: Secondary | ICD-10-CM | POA: Diagnosis not present

## 2022-03-02 DIAGNOSIS — I10 Essential (primary) hypertension: Secondary | ICD-10-CM | POA: Diagnosis not present

## 2022-03-02 DIAGNOSIS — F01518 Vascular dementia, unspecified severity, with other behavioral disturbance: Secondary | ICD-10-CM

## 2022-03-02 DIAGNOSIS — Z7189 Other specified counseling: Secondary | ICD-10-CM | POA: Diagnosis not present

## 2022-03-02 NOTE — Progress Notes (Signed)
? ?Location:  [facility] ?Nursing Home Room Number: 118-A ?Place of Service:  SNF (31) ?Provider:  Durenda Age, DNP, FNP-BC ? ?Patient Care Team: ?Hendricks Limes, MD as PCP - General (Internal Medicine) ? ?Extended Emergency Contact Information ?Primary Emergency Contact: Lance Coon ? [place] ?Mobile Phone: [phone] ?Relation: Son ?Secondary Emergency Contact: Haywood Filler ?Home Phone: [phone] ?Relation: Relative ? ?Code Status:  DNR ? ?Goals of care: Advanced Directive information ? ?  03/02/2022  ? 12:00 PM  ?Advanced Directives  ?Does Patient Have a Medical Advance Directive? Yes  ?Type of Advance Directive Out of facility DNR (pink MOST or yellow form)  ?Does patient want to make changes to medical advance directive? No - Patient declined  ?Pre-existing out of facility DNR order (yellow form or pink MOST form) Yellow form placed in chart (order not valid for inpatient use)  ? ? ? ?Chief Complaint  ?Patient presents with  ? Advanced Directive  ?  Care plan meeting   ? ? ?HPI:  ?Pt is a 76 y.o. male seen today for care plan meeting attended by resident, social worker, NP and Glass blower/designer. Son, [name], was called via telephone conference several times but did not answer the phone. He remains to be DNR. Discussed medications, vital signs and weights. He was noted to be happy and no reported agitations ever since he has talked on the phone with son 3 months ago. Latest BIMS score 3/15, ranging in severe cognitive impairment. BPs has been stable. The meeting lasted for 15 minutes. ? ? ?Past Medical History:  ?Diagnosis Date  ? Acute respiratory failure with hypoxia ([place])   ? Cognitive communication deficit   ? Constipation   ? Dysphagia   ? Essential hypertension   ? GERD without esophagitis   ? Gout   ? History of COVID-19   ? Metabolic encephalopathy   ? Muscle weakness   ? Other sequelae of cerebral infarction   ? Pneumonia due to COVID-19 virus 2019   ? ?History reviewed. No pertinent surgical history. ? ?Allergies  ?Allergen Reactions  ? Atorvastatin Itching and Rash  ? ? ?Outpatient Encounter Medications as of 03/02/2022  ?Medication Sig  ? acetaminophen (TYLENOL) 325 MG tablet Take 2 tablets (650 mg total) by mouth every 6 (six) hours as needed for mild pain or headache (fever >/= 101).  ? albuterol (VENTOLIN HFA) 108 (90 Base) MCG/ACT inhaler Inhale 2 puffs into the lungs every 4 (four) hours as needed for wheezing or shortness of breath.  ? amLODipine (NORVASC) 5 MG tablet Take 5 mg by mouth daily.  ? aspirin EC 81 MG tablet Take 1 tablet (81 mg total) by mouth daily.  ? benazepril (LOTENSIN) 10 MG tablet Take 10 mg by mouth daily.  ? bisacodyl (DULCOLAX) 10 MG suppository Place 10 mg rectally daily as needed for moderate constipation.  ? cloNIDine (CATAPRES - DOSED IN MG/24 HR) 0.1 mg/24hr patch Place 0.1 mg onto the skin once a week.  ? divalproex (DEPAKOTE) 125 MG DR tablet Take 125 mg by mouth 2 (two) times daily.  ? magnesium hydroxide (MILK OF MAGNESIA) 400 MG/5ML suspension Take 30 mLs by mouth daily as needed for mild constipation.  ? metoprolol tartrate (LOPRESSOR) 25 MG tablet Take 25 mg by mouth 2 (two) times daily.  ? NON FORMULARY DIET: REGULAR, THIN LIQUIDS  ? ondansetron (ZOFRAN) 4 MG tablet Take 1 tablet (4 mg total) by mouth every 6 (six) hours as needed for nausea.  ?  polyethylene glycol (MIRALAX / GLYCOLAX) 17 g packet Take 17 g by mouth daily as needed for mild constipation.  ? QUEtiapine (SEROQUEL) 25 MG tablet Take 12.5 mg by mouth 2 (two) times daily.  ? Sodium Phosphates (RA SALINE ENEMA RE) Place 1 Dose rectally as needed.  ? UNABLE TO FIND Med Name: Magic Cup once daily to help stabilize weight  ? ?No facility-administered encounter medications on file as of 03/02/2022.  ? ? ?Review of Systems  ?Constitutional:  Negative for activity change, appetite change and fever.  ?HENT:  Negative for sore throat.   ?Eyes: Negative.    ?Cardiovascular:  Negative for chest pain and leg swelling.  ?Gastrointestinal:  Negative for abdominal distention, diarrhea and vomiting.  ?Genitourinary:  Negative for dysuria, frequency and urgency.  ?Skin:  Negative for color change.  ?Neurological:  Negative for dizziness and headaches.  ?Psychiatric/Behavioral:  Negative for behavioral problems and sleep disturbance. The patient is not nervous/anxious.    ? ? ? ?Immunization History  ?Administered Date(s) Administered  ? Influenza, High Dose Seasonal PF 08/14/2014  ? Moderna SARS-COV2 Booster Vaccination 01/20/2021  ? Moderna Sars-Covid-2 Vaccination 05/20/2020, 06/16/2020  ? Pneumococcal Polysaccharide-23 03/04/2020  ? Unspecified SARS-COV-2 Vaccination 01/20/2021  ? ?Pertinent  Health Maintenance Due  ?Topic Date Due  ? COLONOSCOPY (Pts 45-69yrs Insurance coverage will need to be confirmed)  11/11/2022 (Originally 07/26/1991)  ? INFLUENZA VACCINE  06/15/2022  ? ? ?  03/02/2020  ?  9:00 AM 03/02/2020  ?  8:00 PM 03/03/2020  ?  8:50 AM 03/03/2020  ?  8:07 PM 03/04/2020  ?  7:18 AM  ?Fall Risk  ?Patient Fall Risk Level High fall risk High fall risk High fall risk High fall risk High fall risk  ? ? ? ?Vitals:  ? 03/02/22 1154  ?BP: (!) 118/59  ?Pulse: (!) 59  ?Resp: 19  ?Temp: (!) 97.4 ?F (36.3 ?C)  ?Weight: 165 lb 3.2 oz (74.9 kg)  ?Height: 5\' 8"  (1.727 m)  ? ?Body mass index is 25.12 kg/m?. ? ?Physical Exam ?Constitutional:   ?   General: He is not in acute distress. ?   Appearance: Normal appearance.  ?HENT:  ?   Head: Normocephalic and atraumatic.  ?   Mouth/Throat:  ?   Mouth: Mucous membranes are moist.  ?Eyes:  ?   Conjunctiva/sclera: Conjunctivae normal.  ?Cardiovascular:  ?   Rate and Rhythm: Normal rate and regular rhythm.  ?   Pulses: Normal pulses.  ?   Heart sounds: Normal heart sounds.  ?Pulmonary:  ?   Effort: Pulmonary effort is normal.  ?   Breath sounds: Normal breath sounds.  ?Abdominal:  ?   General: Bowel sounds are normal.  ?   Palpations:  Abdomen is soft.  ?Musculoskeletal:     ?   General: No swelling. Normal range of motion.  ?   Cervical back: Normal range of motion.  ?Skin: ?   General: Skin is warm and dry.  ?Neurological:  ?   Mental Status: He is alert. Mental status is at baseline. He is disoriented.  ?Psychiatric:     ?   Mood and Affect: Mood normal.     ?   Behavior: Behavior normal.  ?  ? ? ? ?Labs reviewed: ?Recent Labs  ?  07/02/21 ?0000 11/12/21 ?0000 12/25/21 ?0000  ?NA 143 139 139  ?K 4.5 4.7 4.2  ?CL 104 104 104  ?CO2 20 19 23*  ?BUN 19 16 14   ?CREATININE  1.3 1.3 1.0  ?CALCIUM 9.9 9.4 9.2  ? ?Recent Labs  ?  07/02/21 ?0000 11/12/21 ?0000  ?AST 20 14  ?ALT 14 12  ?ALKPHOS  --  83  ?ALBUMIN 4.6 4.1  ? ?Recent Labs  ?  07/02/21 ?0000 11/12/21 ?0000 12/25/21 ?0000  ?WBC 9.1 8.2 7.0  ?NEUTROABS 4.50 4.30  --   ?HGB 16.6 16.7 16.7  ?HCT 50 52 50  ?PLT 269 254 214  ? ?Lab Results  ?Component Value Date  ? TSH 1.20 11/27/2020  ? ?Lab Results  ?Component Value Date  ? HGBA1C 5.6 02/26/2020  ? ?Lab Results  ?Component Value Date  ? CHOL 124 12/11/2021  ? HDL 34 (A) 12/11/2021  ? [place] 65 12/11/2021  ? TRIG 125 12/11/2021  ? ? ?Significant Diagnostic Results in last 30 days:  ?No results found. ? ?Assessment/Plan ? ?1. ACP (advance care planning) ?-   remains to be DNR ?-   discussed medications, vital signs and weights ? ?2. Essential hypertension ?-   Blood pressure well controlled ?Continue current medications ? ?3. Mood disorder with psychosis ([place]) ?-   mood is stable ?-  continue Quetiapine and Divalproex DR ? ?4. Vascular dementia with behavior disturbance ([place]) ?- Do not attempt resuscitation (DNR) ?-  BIMS score 3/15, ranging in severe cognitive impairment ?-  continue supportive care ? ? ? ?Family/ staff Communication: Discussed plan of care with resident and charge nurse. ? ?Labs/tests ordered:   None ? ? ? ?Durenda Age, DNP, MSN, FNP-BC ?[facility] ?[phone] (Monday-Friday 8:00 a.m. -  5:00 p.m.) ?[phone] (after hours) ? ?

## 2022-04-08 ENCOUNTER — Encounter: Payer: Self-pay | Admitting: Internal Medicine

## 2022-04-08 ENCOUNTER — Non-Acute Institutional Stay (SKILLED_NURSING_FACILITY): Payer: Medicare Other | Admitting: Internal Medicine

## 2022-04-08 DIAGNOSIS — Z8673 Personal history of transient ischemic attack (TIA), and cerebral infarction without residual deficits: Secondary | ICD-10-CM

## 2022-04-08 DIAGNOSIS — I10 Essential (primary) hypertension: Secondary | ICD-10-CM

## 2022-04-08 DIAGNOSIS — E782 Mixed hyperlipidemia: Secondary | ICD-10-CM

## 2022-04-08 DIAGNOSIS — N1831 Chronic kidney disease, stage 3a: Secondary | ICD-10-CM | POA: Diagnosis not present

## 2022-04-08 NOTE — Assessment & Plan Note (Addendum)
Current LDL is at goal of less than 70 with a value of 65 without statin therapy.  No change indicated.  Annual monitor fasting lipids.

## 2022-04-08 NOTE — Assessment & Plan Note (Signed)
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  There is no indication for change in the medication regimen or dosages unless there is progression of the CKD.

## 2022-04-08 NOTE — Patient Instructions (Signed)
See assessment and plan under each diagnosis in the problem list and acutely for this visit 

## 2022-04-08 NOTE — Assessment & Plan Note (Signed)
BP controlled on 4 drug regimen; no change in antihypertensive medications

## 2022-04-08 NOTE — Progress Notes (Signed)
NURSING HOME LOCATION:  [facility] ROOM NUMBER:  [id number]  CODE STATUS:  DNR  PCP:  Durenda Age NP,PSC  This is a nursing facility follow up visit of chronic medical diagnoses & to document compliance with Regulation 483.30 (c) in The [place] Manual Phase 2 which mandates caregiver visit ( visits can alternate among physician, PA or NP as per statutes) within 10 days of 30 days / 60 days/ 90 days post admission to SNF date    Interim medical record and care since last SNF visit was updated with review of diagnostic studies and change in clinical status since last visit were documented.  HPI: He is a permanent resident of facility with medical diagnoses of essential hypertension, GERD, history of gout, history of stroke complicated by dysphagia and cognitive communication deficits.  Most recent labs were performed 12/25/2021.  Creatinine had improved from 1.3 to 1.0 with GFR greater than 60 indicating CKD stage II.  CBC revealed high normal H/H of 16.7/50 without frank polycythemia.  Lipids were performed 12/11/2021 and revealed an LDL of 65, at goal of less than 70 in the context of his history of stroke.  Review of systems: His response to all queries was a monosyllablic "no".  This included complete review of systems with specific focus on dysphagia or neuromuscular symptoms.  Constitutional: No fever, significant weight change, fatigue  Eyes: No redness, discharge, pain, vision change ENT/mouth: No nasal congestion,  purulent discharge, earache, change in hearing, sore throat  Cardiovascular: No chest pain, palpitations, paroxysmal nocturnal dyspnea, claudication, edema  Respiratory: No cough, sputum production, hemoptysis, DOE, significant snoring, apnea   Gastrointestinal: No heartburn, dysphagia, abdominal pain, nausea /vomiting, rectal bleeding, melena, change in bowels Genitourinary: No dysuria, hematuria, pyuria, incontinence,  nocturia Musculoskeletal: No joint stiffness, joint swelling, weakness, pain Dermatologic: No rash, pruritus, change in appearance of skin Neurologic: No dizziness, headache, syncope, seizures, numbness, tingling Psychiatric: No significant anxiety, depression, insomnia, anorexia Endocrine: No change in hair/skin/nails, excessive thirst, excessive hunger, excessive urination  Hematologic/lymphatic: No significant bruising, lymphadenopathy, abnormal bleeding Allergy/immunology: No itchy/watery eyes, significant sneezing, urticaria, angioedema  Physical exam:  Pertinent or positive findings: It was after 10 AM but he was still in bed asleep.  He exhibited no hypopnea, apnea, or snoring.  He did awaken easily.  He has a beard and mustache which is somewhat unkempt.  He has bilateral ptosis.  The lower lids are puffy.  Teeth are somewhat coated with some malalignment and missing teeth.  Heart sounds are markedly distant ,heard best in the epigastrium.  Abdomen is protuberant.  The abdomen is tympanic to percussion.  Pedal pulses are surprisingly good.  He has papular type hyperpigmented lesions over the right shin.  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  Strength to opposition is good in all extremities without significant asymmetry.  General appearance: Adequately nourished; no acute distress, increased work of breathing is present.   Lymphatic: No lymphadenopathy about the head, neck, axilla. Eyes: No conjunctival inflammation or lid edema is present. There is no scleral icterus. Ears:  External ear exam shows no significant lesions or deformities.   Nose:  External nasal examination shows no deformity or inflammation. Nasal mucosa are pink and moist without lesions, exudates Neck:  No thyromegaly, masses, tenderness noted.    Heart:  Normal rate and regular rhythm. S1 and S2 normal without gallop, murmur, click, rub .  Lungs: Chest clear to auscultation without  wheezes, rhonchi,  rales, rubs. Abdomen: Bowel sounds are normal. Abdomen is soft and nontender with no organomegaly, hernias, masses. GU: Deferred  Extremities:  No cyanosis, clubbing, edema  Neurologic exam :Balance, Rhomberg, finger to nose testing could not be completed due to clinical state Skin: Warm & dry w/o tenting. No significant lrash.  See summary under each active problem in the Problem List with associated updated therapeutic plan

## 2022-04-08 NOTE — Assessment & Plan Note (Addendum)
He is wheelchair-bound but is able to mobilize throughout the facility without help.  Strength to opposition is surprisingly good without significant asymmetry.  Secondary prevention adequate with an LDL of less than 70 and well-controlled blood pressure.

## 2022-09-15 ENCOUNTER — Other Ambulatory Visit: Payer: Self-pay

## 2022-09-15 ENCOUNTER — Emergency Department (HOSPITAL_COMMUNITY): Payer: TRICARE For Life (TFL)

## 2022-09-15 ENCOUNTER — Inpatient Hospital Stay (HOSPITAL_COMMUNITY)
Admission: EM | Admit: 2022-09-15 | Discharge: 2022-09-20 | DRG: 682 | Disposition: A | Discharge status: Skilled Nursing Facility | Payer: Medicare Other | Source: Skilled Nursing Facility | Attending: Internal Medicine | Admitting: Internal Medicine

## 2022-09-15 ENCOUNTER — Encounter (HOSPITAL_COMMUNITY): Payer: Self-pay | Admitting: *Deleted

## 2022-09-15 ENCOUNTER — Emergency Department (HOSPITAL_COMMUNITY): Payer: Medicare Other

## 2022-09-15 DIAGNOSIS — E870 Hyperosmolality and hypernatremia: Secondary | ICD-10-CM | POA: Diagnosis present

## 2022-09-15 DIAGNOSIS — R6510 Systemic inflammatory response syndrome (SIRS) of non-infectious origin without acute organ dysfunction: Secondary | ICD-10-CM | POA: Diagnosis present

## 2022-09-15 DIAGNOSIS — N401 Enlarged prostate with lower urinary tract symptoms: Secondary | ICD-10-CM | POA: Diagnosis present

## 2022-09-15 DIAGNOSIS — N132 Hydronephrosis with renal and ureteral calculous obstruction: Secondary | ICD-10-CM | POA: Diagnosis present

## 2022-09-15 DIAGNOSIS — Z9981 Dependence on supplemental oxygen: Secondary | ICD-10-CM

## 2022-09-15 DIAGNOSIS — R680 Hypothermia, not associated with low environmental temperature: Secondary | ICD-10-CM | POA: Diagnosis present

## 2022-09-15 DIAGNOSIS — I129 Hypertensive chronic kidney disease with stage 1 through stage 4 chronic kidney disease, or unspecified chronic kidney disease: Secondary | ICD-10-CM | POA: Diagnosis present

## 2022-09-15 DIAGNOSIS — N200 Calculus of kidney: Secondary | ICD-10-CM

## 2022-09-15 DIAGNOSIS — E872 Acidosis, unspecified: Secondary | ICD-10-CM | POA: Diagnosis present

## 2022-09-15 DIAGNOSIS — E875 Hyperkalemia: Secondary | ICD-10-CM | POA: Diagnosis not present

## 2022-09-15 DIAGNOSIS — N19 Unspecified kidney failure: Secondary | ICD-10-CM

## 2022-09-15 DIAGNOSIS — N1831 Chronic kidney disease, stage 3a: Secondary | ICD-10-CM | POA: Diagnosis present

## 2022-09-15 DIAGNOSIS — G9341 Metabolic encephalopathy: Secondary | ICD-10-CM | POA: Diagnosis present

## 2022-09-15 DIAGNOSIS — D75839 Thrombocytosis, unspecified: Secondary | ICD-10-CM | POA: Diagnosis present

## 2022-09-15 DIAGNOSIS — N138 Other obstructive and reflux uropathy: Secondary | ICD-10-CM | POA: Diagnosis present

## 2022-09-15 DIAGNOSIS — Z7982 Long term (current) use of aspirin: Secondary | ICD-10-CM

## 2022-09-15 DIAGNOSIS — Z1152 Encounter for screening for COVID-19: Secondary | ICD-10-CM

## 2022-09-15 DIAGNOSIS — M109 Gout, unspecified: Secondary | ICD-10-CM | POA: Diagnosis present

## 2022-09-15 DIAGNOSIS — Z79899 Other long term (current) drug therapy: Secondary | ICD-10-CM

## 2022-09-15 DIAGNOSIS — R195 Other fecal abnormalities: Secondary | ICD-10-CM | POA: Diagnosis present

## 2022-09-15 DIAGNOSIS — I6930 Unspecified sequelae of cerebral infarction: Secondary | ICD-10-CM

## 2022-09-15 DIAGNOSIS — T68XXXA Hypothermia, initial encounter: Secondary | ICD-10-CM

## 2022-09-15 DIAGNOSIS — N179 Acute kidney failure, unspecified: Secondary | ICD-10-CM | POA: Diagnosis not present

## 2022-09-15 DIAGNOSIS — N1330 Unspecified hydronephrosis: Secondary | ICD-10-CM

## 2022-09-15 DIAGNOSIS — L89151 Pressure ulcer of sacral region, stage 1: Secondary | ICD-10-CM | POA: Diagnosis present

## 2022-09-15 DIAGNOSIS — Z993 Dependence on wheelchair: Secondary | ICD-10-CM

## 2022-09-15 DIAGNOSIS — Z888 Allergy status to other drugs, medicaments and biological substances status: Secondary | ICD-10-CM

## 2022-09-15 DIAGNOSIS — K219 Gastro-esophageal reflux disease without esophagitis: Secondary | ICD-10-CM | POA: Diagnosis present

## 2022-09-15 DIAGNOSIS — J9611 Chronic respiratory failure with hypoxia: Secondary | ICD-10-CM | POA: Diagnosis present

## 2022-09-15 DIAGNOSIS — R7401 Elevation of levels of liver transaminase levels: Secondary | ICD-10-CM | POA: Diagnosis present

## 2022-09-15 DIAGNOSIS — R1310 Dysphagia, unspecified: Secondary | ICD-10-CM | POA: Diagnosis present

## 2022-09-15 DIAGNOSIS — R4182 Altered mental status, unspecified: Principal | ICD-10-CM

## 2022-09-15 DIAGNOSIS — K5909 Other constipation: Secondary | ICD-10-CM | POA: Diagnosis present

## 2022-09-15 DIAGNOSIS — Z66 Do not resuscitate: Secondary | ICD-10-CM | POA: Diagnosis present

## 2022-09-15 DIAGNOSIS — M25422 Effusion, left elbow: Secondary | ICD-10-CM | POA: Diagnosis present

## 2022-09-15 DIAGNOSIS — Z8616 Personal history of COVID-19: Secondary | ICD-10-CM

## 2022-09-15 DIAGNOSIS — F03918 Unspecified dementia, unspecified severity, with other behavioral disturbance: Secondary | ICD-10-CM | POA: Diagnosis present

## 2022-09-15 LAB — POC OCCULT BLOOD, ED
Fecal Occult Bld: POSITIVE — AB
Fecal Occult Bld: POSITIVE — AB

## 2022-09-15 LAB — CBC WITH DIFFERENTIAL/PLATELET
Abs Immature Granulocytes: 0.25 10*3/uL — ABNORMAL HIGH (ref 0.00–0.07)
Basophils Absolute: 0 10*3/uL (ref 0.0–0.1)
Basophils Relative: 0 %
Eosinophils Absolute: 0 10*3/uL (ref 0.0–0.5)
Eosinophils Relative: 0 %
HCT: 45.9 % (ref 39.0–52.0)
Hemoglobin: 15.9 g/dL (ref 13.0–17.0)
Immature Granulocytes: 2 %
Lymphocytes Relative: 5 %
Lymphs Abs: 0.8 10*3/uL (ref 0.7–4.0)
MCH: 31.5 pg (ref 26.0–34.0)
MCHC: 34.6 g/dL (ref 30.0–36.0)
MCV: 91.1 fL (ref 80.0–100.0)
Monocytes Absolute: 1.3 10*3/uL — ABNORMAL HIGH (ref 0.1–1.0)
Monocytes Relative: 8 %
Neutro Abs: 14.6 10*3/uL — ABNORMAL HIGH (ref 1.7–7.7)
Neutrophils Relative %: 85 %
Platelets: 490 10*3/uL — ABNORMAL HIGH (ref 150–400)
RBC: 5.04 MIL/uL (ref 4.22–5.81)
RDW: 13.8 % (ref 11.5–15.5)
WBC: 16.9 10*3/uL — ABNORMAL HIGH (ref 4.0–10.5)
nRBC: 0 % (ref 0.0–0.2)

## 2022-09-15 MED ORDER — METRONIDAZOLE 500 MG/100ML IV SOLN
500.0000 mg | Freq: Once | INTRAVENOUS | Status: AC
  Administered 2022-09-16: 500 mg via INTRAVENOUS
  Filled 2022-09-15: qty 100

## 2022-09-15 MED ORDER — VANCOMYCIN HCL IN DEXTROSE 1-5 GM/200ML-% IV SOLN: 1000.0000 mg | Freq: Once | INTRAVENOUS | Status: DC

## 2022-09-15 MED ORDER — SODIUM CHLORIDE 0.9 % IV SOLN
2.0000 g | Freq: Once | INTRAVENOUS | Status: AC
  Administered 2022-09-15: 2 g via INTRAVENOUS
  Filled 2022-09-15: qty 12.5

## 2022-09-15 MED ORDER — LACTATED RINGERS IV BOLUS (SEPSIS)
1000.0000 mL | Freq: Once | INTRAVENOUS | Status: AC
  Administered 2022-09-16: 1000 mL via INTRAVENOUS

## 2022-09-15 MED ORDER — VANCOMYCIN HCL 1500 MG/300ML IV SOLN
1500.0000 mg | Freq: Once | INTRAVENOUS | Status: AC
  Administered 2022-09-16: 1500 mg via INTRAVENOUS
  Filled 2022-09-15: qty 300

## 2022-09-15 MED ORDER — LACTATED RINGERS IV BOLUS
1000.0000 mL | Freq: Once | INTRAVENOUS | Status: AC
  Administered 2022-09-15: 1000 mL via INTRAVENOUS

## 2022-09-15 MED ORDER — LACTATED RINGERS IV SOLN: INTRAVENOUS | Status: DC

## 2022-09-15 NOTE — ED Triage Notes (Signed)
Pt from [facility] for sob and generalized pain. Staff reported pt usually walks the halls while pushing a wheelchair but has been lying around today. Staff also reported black tarry stools. Swelling to L elbow noted. EMS VS 100/82, pulse 74. Pt alert to name only. C/o generalized pain, worsening on movement. Tender to abdomen. Brown stool noted in brief, clean on arrival

## 2022-09-15 NOTE — ED Notes (Signed)
Pt refusing IV placement and foley, stating to leave him alone. Explained need for both, pt continues to refuse. Alert only to self at present, provider made aware

## 2022-09-15 NOTE — Progress Notes (Signed)
Pharmacy Antibiotic Note  Vincent Cummings is a 76 y.o. male for which pharmacy has been consulted for cefepime and vancomycin dosing for sepsis. Patient presenting with AMS from SNF.  Labs are pending  Plan: Cefepime 2g and vancomycin 1500mg  once given in ED -- repeat dosing per labs Trend WBC, Fever, Renal function F/u cultures, clinical course, WBC, fever De-escalate when able     Temp (24hrs), Avg:95.7 F (35.4 C), Min:95.7 F (35.4 C), Max:95.7 F (35.4 C)  No results for input(s): "WBC", "CREATININE", "LATICACIDVEN", "VANCOTROUGH", "VANCOPEAK", "VANCORANDOM", "GENTTROUGH", "GENTPEAK", "GENTRANDOM", "TOBRATROUGH", "TOBRAPEAK", "TOBRARND", "AMIKACINPEAK", "AMIKACINTROU", "AMIKACIN" in the last 168 hours.  CrCl cannot be calculated (Patient's most recent lab result is older than the maximum 21 days allowed.).    Allergies  Allergen Reactions   Atorvastatin Itching and Rash    Antimicrobials this admission: vancomycin 11/1 >>  flagyl 11/1 >>  cefepime 11/1 >>   Microbiology results: Pending  Thank you for allowing pharmacy to be a part of this patient's care.  Lorelei Pont, PharmD, BCPS 09/15/2022 10:45 PM ED Clinical Pharmacist -  [phone]

## 2022-09-15 NOTE — ED Provider Notes (Signed)
[facility]EMERGENCY DEPT [facility] Emergency Department Provider Note MRN:  [id number]  Arrival date & time: 09/16/22     Chief Complaint   Altered Mental Status   History of Present Illness   Vincent Cummings is a 76 y.o. year-old male presents to the ED with chief complaint of AMS.  From SNF.  DNR.  SNF reports that patient normally able to wheel himself about the nursing home in his wheel chair.  Has been complaining of pain all over.  Reported dark stools.  Patient states that he is cold and has pain in his abdomen.  Hx provided by EMS, nursing, and limited hx from patient.   Review of Systems  Pertinent positive and negative review of systems noted in HPI.    Physical Exam   Vitals:   09/16/22 0430 09/16/22 0445  BP: 117/71 127/83  Pulse: (!) 115 (!) 115  Resp: 17 16  Temp:    SpO2: 100% 99%    CONSTITUTIONAL:  chronically ill-appearing, NAD NEURO:  Alert and oriented to person, CN 3-12 grossly intact EYES:  eyes equal and reactive ENT/NECK:  Supple, no stridor  CARDIO:  tachycardic, regular rhythm, distal pulses intact PULM:  No respiratory distress, CTAB GI/GU:  non-distended, suprapubic tenderness MSK/SPINE:  No gross deformities, 1+ pitting edema in lower extremities  SKIN:  no rash, atraumatic   *Additional and/or pertinent findings included in MDM below  Diagnostic and Interventional Summary    EKG Interpretation  Date/Time:  Wednesday September 15 2022 22:14:05 EDT Ventricular Rate:  101 PR Interval:  194 QRS Duration: 102 QT Interval:  329 QTC Calculation: 427 R Axis:   85 Text Interpretation: Sinus tachycardia Probable left atrial enlargement Borderline right axis deviation Artifact in lead(s) I aVR aVL V1 V2 No significant change was found Confirmed by Glynn Octave [phone]) on 09/15/2022 10:25:18 PM       Labs Reviewed  LACTIC ACID, PLASMA - Abnormal; Notable for the following components:      Result Value   Lactic Acid, Venous 2.3 (*)     All other components within normal limits  LACTIC ACID, PLASMA - Abnormal; Notable for the following components:   Lactic Acid, Venous 2.2 (*)    All other components within normal limits  CBC WITH DIFFERENTIAL/PLATELET - Abnormal; Notable for the following components:   WBC 16.9 (*)    Platelets 490 (*)    Neutro Abs 14.6 (*)    Monocytes Absolute 1.3 (*)    Abs Immature Granulocytes 0.25 (*)    All other components within normal limits  URINALYSIS, ROUTINE W REFLEX MICROSCOPIC - Abnormal; Notable for the following components:   Hgb urine dipstick MODERATE (*)    Bacteria, UA RARE (*)    All other components within normal limits  VALPROIC ACID LEVEL - Abnormal; Notable for the following components:   Valproic Acid Lvl <10 (*)    All other components within normal limits  PROTIME-INR - Abnormal; Notable for the following components:   Prothrombin Time 16.6 (*)    INR 1.4 (*)    All other components within normal limits  COMPREHENSIVE METABOLIC PANEL - Abnormal; Notable for the following components:   Potassium 7.2 (*)    CO2 14 (*)    Glucose, Bld 107 (*)    BUN 182 (*)    Creatinine, Ser 13.67 (*)    Calcium 8.7 (*)    Albumin 2.3 (*)    ALT 73 (*)    GFR, Estimated 3 (*)  Anion gap 21 (*)    All other components within normal limits  ACETAMINOPHEN LEVEL - Abnormal; Notable for the following components:   Acetaminophen (Tylenol), Serum <10 (*)    All other components within normal limits  SALICYLATE LEVEL - Abnormal; Notable for the following components:   Salicylate Lvl <7.0 (*)    All other components within normal limits  GLUCOSE, RANDOM - Abnormal; Notable for the following components:   Glucose, Bld 176 (*)    All other components within normal limits  POTASSIUM - Abnormal; Notable for the following components:   Potassium 5.2 (*)    All other components within normal limits  POC OCCULT BLOOD, ED - Abnormal; Notable for the following components:   Fecal  Occult Bld POSITIVE (*)    All other components within normal limits  POC OCCULT BLOOD, ED - Abnormal; Notable for the following components:   Fecal Occult Bld POSITIVE (*)    All other components within normal limits  CBG MONITORING, ED - Abnormal; Notable for the following components:   Glucose-Capillary 170 (*)    All other components within normal limits  CBG MONITORING, ED - Abnormal; Notable for the following components:   Glucose-Capillary 104 (*)    All other components within normal limits  CBG MONITORING, ED - Abnormal; Notable for the following components:   Glucose-Capillary 123 (*)    All other components within normal limits  RESP PANEL BY RT-PCR (FLU A&B, COVID) ARPGX2  CULTURE, BLOOD (ROUTINE X 2)  CULTURE, BLOOD (ROUTINE X 2)  URINE CULTURE  APTT  RAPID URINE DRUG SCREEN, HOSP PERFORMED  ETHANOL  AMMONIA  RENAL FUNCTION PANEL  MAGNESIUM  PHOSPHORUS  CBC WITH DIFFERENTIAL/PLATELET  CBG MONITORING, ED    CT HEAD WO CONTRAST ( )  Final Result    CT ABDOMEN PELVIS WO CONTRAST  Final Result    DG Chest Port 1 View  Final Result    DG Elbow Complete Left  Final Result    DG Pelvis Portable  Final Result      Medications  sodium zirconium cyclosilicate (LOKELMA) packet 10 g (10 g Oral Patient Refused/Not Given 09/16/22 0239)  sodium bicarbonate 150 mEq in dextrose 5 % 1,150 mL infusion ( Intravenous New Bag/Given 09/16/22 0259)  ceFEPIme (MAXIPIME) 1 g in sodium chloride 0.9 % 100 mL IVPB (has no administration in time range)  acetaminophen (TYLENOL) tablet 650 mg (has no administration in time range)    Or  acetaminophen (TYLENOL) suppository 650 mg (has no administration in time range)  lactated ringers bolus 1,000 mL (0 mLs Intravenous Stopped 09/16/22 0033)  ceFEPIme (MAXIPIME) 2 g in sodium chloride 0.9 % 100 mL IVPB (0 g Intravenous Stopped 09/16/22 0034)  metroNIDAZOLE (FLAGYL) IVPB 500 mg (0 mg Intravenous Stopped 09/16/22 0138)  lactated ringers  bolus 1,000 mL (0 mLs Intravenous Stopped 09/16/22 0138)  vancomycin (VANCOREADY) IVPB 1500 mg/300 mL (0 mg Intravenous Stopped 09/16/22 0401)  calcium gluconate inj 10% (1 g) URGENT USE ONLY! (1 g Intravenous Given 09/16/22 0211)  sodium bicarbonate injection 100 mEq (100 mEq Intravenous Given 09/16/22 0211)  dextrose 10 % infusion ( Intravenous New Bag/Given 09/16/22 0414)  insulin aspart (novoLOG) injection 5 Units (5 Units Intravenous Given 09/16/22 0235)    And  dextrose 50 % solution 50 mL (50 mLs Intravenous Given 09/16/22 0232)  sodium chloride 0.9 % bolus 2,000 mL (2,000 mLs Intravenous New Bag/Given 09/16/22 0220)     Procedures  /  Critical Care .Critical  Care  Performed by: Montine Circle, PA-C Authorized by: Montine Circle, PA-C   Critical care provider statement:    Critical care time (minutes):  95   Critical care was necessary to treat or prevent imminent or life-threatening deterioration of the following conditions:  Metabolic crisis and renal failure   Critical care was time spent personally by me on the following activities:  Development of treatment plan with patient or surrogate, discussions with consultants, evaluation of patient's response to treatment, examination of patient, ordering and review of laboratory studies, ordering and review of radiographic studies, ordering and performing treatments and interventions, pulse oximetry, re-evaluation of patient's condition and review of old charts   ED Course and Medical Decision Making  I have reviewed the triage vital signs, the nursing notes, and pertinent available records from the EMR.  Social Determinants Affecting Complexity of Care: Patient has no clinically significant social determinants affecting this chief complaint..   ED Course: Clinical Course as of 09/16/22 0527  Thu Sep 16, 2022  0211 Spoke with Dr. Carolin Sicks, nephrology, who recommends pushing 2 additional liters of NS, medical management with bicarb,  lokelma, insulin, and eventually lasix.  Recommends admission to ICU, but no emergent dialysis at this point.  He will consult in the morning. [RB]  0219 Spoke with Dr. Ruthann Cancer, intensivist, who agrees with plan for medical management and pushing fluids.  Recommends recheck of K.  States might be appropriate for stepdown.  Critical care to consult in the meantime while awaiting repeat K. [RB]  8242 Case discussed with Dr. Velia Meyer, who is appreciated for admitting the patient to the hospital.   [RB]    Clinical Course User Index [RB] Montine Circle, PA-C    Medical Decision Making Patient here from SNF for new confusion.  Will check labs, imaging.  High degree of suspicion for sepsis.  Patient noted to be hypothermic at 95.7, he is tachycardic and tachypneic.  We will proceed with sepsis bundle.  We will hold off on weight-based fluids since he is not hypotensive.  We will monitor lactic.  Lactic is mildly elevated 2.3, but does not appear to indicate septic shock.  Potassium is 7.2, BUN 182, creatinine 13.  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  This seems to be an acute problem.  He is noted to have urinary retention of greater than 1000 cc.  Foley catheter inserted.  I discussed case with nephrology and with critical care as discussed below in the ED course.    Problems Addressed: Acute renal failure, unspecified acute renal failure type [facility]): acute illness or injury that poses a threat to life or bodily functions Altered mental status, unspecified altered mental status type: acute illness or injury with systemic symptoms Hyperkalemia: acute illness or injury that poses a threat to life or bodily functions Hypothermia, initial encounter: undiagnosed new problem with uncertain prognosis Uremia: acute illness or injury that poses a threat to life or bodily functions  Amount and/or Complexity of Data Reviewed Labs: ordered.    Details: Potassium 7.2,  treated with Lokelma, insulin, bicarb.  BUN 182, likely uremia causing his confusion tonight.  Creatinine is 13. Leukocytosis to 16.9, no source of infection at this time.  COVID-negative. Hemoccult positive, but stool is brown, nonbloody, nonmelanotic Urinalysis inconsistent with infection Radiology: ordered and independent interpretation performed.    Details: Chest x-ray shows mild cardiomegaly ECG/medicine tests: ordered and independent interpretation performed.    Details: Peaked T waves seen  on EKG  Risk OTC drugs. Prescription drug management. Decision regarding hospitalization.     Consultants: Consultants as documented in ED course.   Treatment and Plan: Patient's exam and diagnostic results are concerning for uremia, AKI, AMS.  Feel that patient will need admission to the hospital for further treatment and evaluation.    Final Clinical Impressions(s) / ED Diagnoses     ICD-10-CM   1. Altered mental status, unspecified altered mental status type  R41.82     2. Uremia  N19     3. Acute renal failure, unspecified acute renal failure type (HCC)  N17.9     4. Hyperkalemia  E87.5     5. Hypothermia, initial encounter  T68.[facility]       ED Discharge Orders     None         Discharge Instructions Discussed with and Provided to Patient:   Discharge Instructions   None      Roxy Horseman, PA-C 09/16/22 0174    Glynn Octave, MD 09/16/22 1106

## 2022-09-15 NOTE — Sepsis Progress Note (Addendum)
Elink monitoring for the code sepsis protocol.  Sent secure chat to bedside RN to verify antibiotic administration.

## 2022-09-15 NOTE — ED Notes (Signed)
Pt with poor vascular access. Delay in antibiotics and blood work due to difficulty obtaining IV access.

## 2022-09-16 ENCOUNTER — Encounter (HOSPITAL_COMMUNITY): Payer: Self-pay | Admitting: Internal Medicine

## 2022-09-16 ENCOUNTER — Emergency Department (HOSPITAL_COMMUNITY): Payer: Medicare Other

## 2022-09-16 DIAGNOSIS — R7401 Elevation of levels of liver transaminase levels: Secondary | ICD-10-CM | POA: Diagnosis not present

## 2022-09-16 DIAGNOSIS — Z79899 Other long term (current) drug therapy: Secondary | ICD-10-CM | POA: Diagnosis not present

## 2022-09-16 DIAGNOSIS — Z9981 Dependence on supplemental oxygen: Secondary | ICD-10-CM | POA: Diagnosis not present

## 2022-09-16 DIAGNOSIS — J9611 Chronic respiratory failure with hypoxia: Secondary | ICD-10-CM | POA: Diagnosis present

## 2022-09-16 DIAGNOSIS — G9341 Metabolic encephalopathy: Secondary | ICD-10-CM | POA: Diagnosis present

## 2022-09-16 DIAGNOSIS — N132 Hydronephrosis with renal and ureteral calculous obstruction: Secondary | ICD-10-CM | POA: Diagnosis present

## 2022-09-16 DIAGNOSIS — Z888 Allergy status to other drugs, medicaments and biological substances status: Secondary | ICD-10-CM | POA: Diagnosis not present

## 2022-09-16 DIAGNOSIS — N179 Acute kidney failure, unspecified: Principal | ICD-10-CM | POA: Diagnosis present

## 2022-09-16 DIAGNOSIS — N401 Enlarged prostate with lower urinary tract symptoms: Secondary | ICD-10-CM | POA: Diagnosis present

## 2022-09-16 DIAGNOSIS — Z66 Do not resuscitate: Secondary | ICD-10-CM | POA: Diagnosis present

## 2022-09-16 DIAGNOSIS — Z1152 Encounter for screening for COVID-19: Secondary | ICD-10-CM | POA: Diagnosis not present

## 2022-09-16 DIAGNOSIS — E875 Hyperkalemia: Secondary | ICD-10-CM | POA: Diagnosis present

## 2022-09-16 DIAGNOSIS — R4182 Altered mental status, unspecified: Secondary | ICD-10-CM | POA: Diagnosis not present

## 2022-09-16 DIAGNOSIS — N138 Other obstructive and reflux uropathy: Secondary | ICD-10-CM | POA: Diagnosis present

## 2022-09-16 DIAGNOSIS — K219 Gastro-esophageal reflux disease without esophagitis: Secondary | ICD-10-CM | POA: Diagnosis present

## 2022-09-16 DIAGNOSIS — R195 Other fecal abnormalities: Secondary | ICD-10-CM | POA: Diagnosis not present

## 2022-09-16 DIAGNOSIS — E872 Acidosis, unspecified: Secondary | ICD-10-CM

## 2022-09-16 DIAGNOSIS — T68XXXA Hypothermia, initial encounter: Secondary | ICD-10-CM | POA: Diagnosis not present

## 2022-09-16 DIAGNOSIS — N1330 Unspecified hydronephrosis: Secondary | ICD-10-CM | POA: Diagnosis not present

## 2022-09-16 DIAGNOSIS — F03918 Unspecified dementia, unspecified severity, with other behavioral disturbance: Secondary | ICD-10-CM

## 2022-09-16 DIAGNOSIS — D75839 Thrombocytosis, unspecified: Secondary | ICD-10-CM

## 2022-09-16 DIAGNOSIS — L89151 Pressure ulcer of sacral region, stage 1: Secondary | ICD-10-CM | POA: Diagnosis present

## 2022-09-16 DIAGNOSIS — Z8616 Personal history of COVID-19: Secondary | ICD-10-CM | POA: Diagnosis not present

## 2022-09-16 DIAGNOSIS — E870 Hyperosmolality and hypernatremia: Secondary | ICD-10-CM | POA: Diagnosis present

## 2022-09-16 DIAGNOSIS — R6510 Systemic inflammatory response syndrome (SIRS) of non-infectious origin without acute organ dysfunction: Secondary | ICD-10-CM

## 2022-09-16 DIAGNOSIS — I129 Hypertensive chronic kidney disease with stage 1 through stage 4 chronic kidney disease, or unspecified chronic kidney disease: Secondary | ICD-10-CM | POA: Diagnosis present

## 2022-09-16 DIAGNOSIS — N200 Calculus of kidney: Secondary | ICD-10-CM

## 2022-09-16 DIAGNOSIS — Z993 Dependence on wheelchair: Secondary | ICD-10-CM | POA: Diagnosis not present

## 2022-09-16 DIAGNOSIS — N1831 Chronic kidney disease, stage 3a: Secondary | ICD-10-CM | POA: Diagnosis present

## 2022-09-16 DIAGNOSIS — M109 Gout, unspecified: Secondary | ICD-10-CM | POA: Diagnosis present

## 2022-09-16 LAB — CBC WITH DIFFERENTIAL/PLATELET
Abs Immature Granulocytes: 0.12 10*3/uL — ABNORMAL HIGH (ref 0.00–0.07)
Basophils Absolute: 0 10*3/uL (ref 0.0–0.1)
Basophils Relative: 0 %
Eosinophils Absolute: 0.1 10*3/uL (ref 0.0–0.5)
Eosinophils Relative: 0 %
HCT: 39.4 % (ref 39.0–52.0)
Hemoglobin: 13.9 g/dL (ref 13.0–17.0)
Immature Granulocytes: 1 %
Lymphocytes Relative: 5 %
Lymphs Abs: 0.6 10*3/uL — ABNORMAL LOW (ref 0.7–4.0)
MCH: 31.9 pg (ref 26.0–34.0)
MCHC: 35.3 g/dL (ref 30.0–36.0)
MCV: 90.4 fL (ref 80.0–100.0)
Monocytes Absolute: 1 10*3/uL (ref 0.1–1.0)
Monocytes Relative: 8 %
Neutro Abs: 10.2 10*3/uL — ABNORMAL HIGH (ref 1.7–7.7)
Neutrophils Relative %: 86 %
Platelets: 404 10*3/uL — ABNORMAL HIGH (ref 150–400)
RBC: 4.36 MIL/uL (ref 4.22–5.81)
RDW: 13.6 % (ref 11.5–15.5)
WBC: 12.1 10*3/uL — ABNORMAL HIGH (ref 4.0–10.5)
nRBC: 0 % (ref 0.0–0.2)

## 2022-09-16 LAB — URINE CULTURE: Culture: NO GROWTH

## 2022-09-16 LAB — URINALYSIS, ROUTINE W REFLEX MICROSCOPIC
Bilirubin Urine: NEGATIVE
Glucose, UA: NEGATIVE mg/dL
Ketones, ur: NEGATIVE mg/dL
Leukocytes,Ua: NEGATIVE
Nitrite: NEGATIVE
Protein, ur: NEGATIVE mg/dL
Specific Gravity, Urine: 1.011 (ref 1.005–1.030)
pH: 5 (ref 5.0–8.0)

## 2022-09-16 LAB — RAPID URINE DRUG SCREEN, HOSP PERFORMED
Amphetamines: NOT DETECTED
Barbiturates: NOT DETECTED
Benzodiazepines: NOT DETECTED
Cocaine: NOT DETECTED
Opiates: NOT DETECTED
Tetrahydrocannabinol: NOT DETECTED

## 2022-09-16 LAB — RENAL FUNCTION PANEL
Albumin: 2 g/dL — ABNORMAL LOW (ref 3.5–5.0)
Anion gap: 12 (ref 5–15)
BUN: 120 mg/dL — ABNORMAL HIGH (ref 8–23)
CO2: 22 mmol/L (ref 22–32)
Calcium: 8.5 mg/dL — ABNORMAL LOW (ref 8.9–10.3)
Chloride: 110 mmol/L (ref 98–111)
Creatinine, Ser: 7.18 mg/dL — ABNORMAL HIGH (ref 0.61–1.24)
GFR, Estimated: 7 mL/min — ABNORMAL LOW (ref >60)
Glucose, Bld: 169 mg/dL — ABNORMAL HIGH (ref 70–99)
Phosphorus: 5.2 mg/dL — ABNORMAL HIGH (ref 2.5–4.6)
Potassium: 5.2 mmol/L — ABNORMAL HIGH (ref 3.5–5.1)
Sodium: 144 mmol/L (ref 135–145)

## 2022-09-16 LAB — LACTIC ACID, PLASMA
Lactic Acid, Venous: 1.5 mmol/L (ref 0.5–1.9)
Lactic Acid, Venous: 2.2 mmol/L (ref 0.5–1.9)
Lactic Acid, Venous: 2.3 mmol/L (ref 0.5–1.9)

## 2022-09-16 LAB — COMPREHENSIVE METABOLIC PANEL
ALT: 73 U/L — ABNORMAL HIGH (ref 0–44)
AST: 34 U/L (ref 15–41)
Albumin: 2.3 g/dL — ABNORMAL LOW (ref 3.5–5.0)
Alkaline Phosphatase: 88 U/L (ref 38–126)
Anion gap: 21 — ABNORMAL HIGH (ref 5–15)
BUN: 182 mg/dL — ABNORMAL HIGH (ref 8–23)
CO2: 14 mmol/L — ABNORMAL LOW (ref 22–32)
Calcium: 8.7 mg/dL — ABNORMAL LOW (ref 8.9–10.3)
Chloride: 100 mmol/L (ref 98–111)
Creatinine, Ser: 13.67 mg/dL — ABNORMAL HIGH (ref 0.61–1.24)
GFR, Estimated: 3 mL/min — ABNORMAL LOW (ref >60)
Glucose, Bld: 107 mg/dL — ABNORMAL HIGH (ref 70–99)
Potassium: 7.2 mmol/L (ref 3.5–5.1)
Sodium: 135 mmol/L (ref 135–145)
Total Bilirubin: 0.7 mg/dL (ref 0.3–1.2)
Total Protein: 6.7 g/dL (ref 6.5–8.1)

## 2022-09-16 LAB — PHOSPHORUS: Phosphorus: 5.1 mg/dL — ABNORMAL HIGH (ref 2.5–4.6)

## 2022-09-16 LAB — CBG MONITORING, ED
Glucose-Capillary: 95 mg/dL (ref 70–99)
Glucose-Capillary: 170 mg/dL — ABNORMAL HIGH (ref 70–99)
Glucose-Capillary: 104 mg/dL — ABNORMAL HIGH (ref 70–99)
Glucose-Capillary: 123 mg/dL — ABNORMAL HIGH (ref 70–99)
Glucose-Capillary: 101 mg/dL — ABNORMAL HIGH (ref 70–99)

## 2022-09-16 LAB — GLUCOSE, RANDOM: Glucose, Bld: 176 mg/dL — ABNORMAL HIGH (ref 70–99)

## 2022-09-16 LAB — ETHANOL: Alcohol, Ethyl (B): <10 mg/dL (ref <10)

## 2022-09-16 LAB — VALPROIC ACID LEVEL: Valproic Acid Lvl: <10 ug/mL — ABNORMAL LOW (ref 50.0–100.0)

## 2022-09-16 LAB — RESP PANEL BY RT-PCR (FLU A&B, COVID) ARPGX2
Influenza A by PCR: NEGATIVE
Influenza B by PCR: NEGATIVE
SARS Coronavirus 2 by RT PCR: NEGATIVE

## 2022-09-16 LAB — ACETAMINOPHEN LEVEL: Acetaminophen (Tylenol), Serum: <10 ug/mL — ABNORMAL LOW (ref 10–30)

## 2022-09-16 LAB — MAGNESIUM: Magnesium: 2.2 mg/dL (ref 1.7–2.4)

## 2022-09-16 LAB — APTT: aPTT: 34 seconds (ref 24–36)

## 2022-09-16 LAB — PROTIME-INR
INR: 1.4 — ABNORMAL HIGH (ref 0.8–1.2)
Prothrombin Time: 16.6 seconds — ABNORMAL HIGH (ref 11.4–15.2)

## 2022-09-16 LAB — HEMOGLOBIN AND HEMATOCRIT, BLOOD
HCT: 40.3 % (ref 39.0–52.0)
Hemoglobin: 13.7 g/dL (ref 13.0–17.0)

## 2022-09-16 LAB — POTASSIUM: Potassium: 5.2 mmol/L — ABNORMAL HIGH (ref 3.5–5.1)

## 2022-09-16 LAB — AMMONIA: Ammonia: 33 umol/L (ref 9–35)

## 2022-09-16 LAB — SALICYLATE LEVEL: Salicylate Lvl: <7 mg/dL — ABNORMAL LOW (ref 7.0–30.0)

## 2022-09-16 MED ORDER — SODIUM BICARBONATE 8.4 % IV SOLN
100.0000 meq | Freq: Once | INTRAVENOUS | Status: AC
  Administered 2022-09-16: 100 meq via INTRAVENOUS
  Filled 2022-09-16: qty 50

## 2022-09-16 MED ORDER — DEXTROSE 10 % IV SOLN: Freq: Once | INTRAVENOUS | Status: AC

## 2022-09-16 MED ORDER — METOPROLOL TARTRATE 25 MG PO TABS
25.0000 mg | ORAL_TABLET | Freq: Two times a day (BID) | ORAL | Status: DC
  Administered 2022-09-16 – 2022-09-17 (×3): 25 mg via ORAL
  Filled 2022-09-16 (×3): qty 1

## 2022-09-16 MED ORDER — DIVALPROEX SODIUM 125 MG PO DR TAB
125.0000 mg | DELAYED_RELEASE_TABLET | Freq: Two times a day (BID) | ORAL | Status: DC
  Administered 2022-09-16 – 2022-09-17 (×3): 125 mg via ORAL
  Filled 2022-09-16 (×5): qty 1

## 2022-09-16 MED ORDER — ACETAMINOPHEN 650 MG RE SUPP: 650.0000 mg | Freq: Four times a day (QID) | RECTAL | Status: DC | PRN

## 2022-09-16 MED ORDER — SODIUM CHLORIDE 0.9 % IV BOLUS
2000.0000 mL | Freq: Once | INTRAVENOUS | Status: AC
  Administered 2022-09-16: 2000 mL via INTRAVENOUS

## 2022-09-16 MED ORDER — ACETAMINOPHEN 325 MG PO TABS: 650.0000 mg | ORAL_TABLET | Freq: Four times a day (QID) | ORAL | Status: DC | PRN

## 2022-09-16 MED ORDER — SODIUM BICARBONATE 8.4 % IV SOLN
INTRAVENOUS | Status: DC
  Filled 2022-09-16 (×2): qty 1000

## 2022-09-16 MED ORDER — DEXTROSE 50 % IV SOLN
1.0000 | Freq: Once | INTRAVENOUS | Status: AC
  Administered 2022-09-16: 50 mL via INTRAVENOUS
  Filled 2022-09-16: qty 50

## 2022-09-16 MED ORDER — CLONIDINE HCL 0.1 MG/24HR TD PTWK
0.1000 mg | MEDICATED_PATCH | TRANSDERMAL | Status: DC
  Administered 2022-09-16: 0.1 mg via TRANSDERMAL
  Filled 2022-09-16: qty 1

## 2022-09-16 MED ORDER — LACTATED RINGERS IV BOLUS: 1000.0000 mL | Freq: Once | INTRAVENOUS | Status: DC

## 2022-09-16 MED ORDER — ONDANSETRON HCL 4 MG/2ML IJ SOLN: 4.0000 mg | Freq: Four times a day (QID) | INTRAMUSCULAR | Status: DC | PRN

## 2022-09-16 MED ORDER — QUETIAPINE FUMARATE 25 MG PO TABS
12.5000 mg | ORAL_TABLET | Freq: Two times a day (BID) | ORAL | Status: DC
  Administered 2022-09-16 – 2022-09-19 (×8): 12.5 mg via ORAL
  Filled 2022-09-16 (×9): qty 1

## 2022-09-16 MED ORDER — VANCOMYCIN VARIABLE DOSE PER UNSTABLE RENAL FUNCTION (PHARMACIST DOSING): Status: DC

## 2022-09-16 MED ORDER — LACTATED RINGERS IV SOLN: INTRAVENOUS | Status: DC

## 2022-09-16 MED ORDER — CALCIUM GLUCONATE 10 % IV SOLN
1.0000 g | Freq: Once | INTRAVENOUS | Status: AC
  Administered 2022-09-16: 1 g via INTRAVENOUS
  Filled 2022-09-16: qty 10

## 2022-09-16 MED ORDER — DOCUSATE SODIUM 100 MG PO CAPS
100.0000 mg | ORAL_CAPSULE | Freq: Every day | ORAL | Status: DC
  Administered 2022-09-16: 100 mg via ORAL
  Filled 2022-09-16: qty 1

## 2022-09-16 MED ORDER — AMLODIPINE BESYLATE 5 MG PO TABS: 5.0000 mg | ORAL_TABLET | Freq: Every day | ORAL | Status: DC

## 2022-09-16 MED ORDER — PANTOPRAZOLE SODIUM 40 MG IV SOLR
40.0000 mg | INTRAVENOUS | Status: DC
  Administered 2022-09-16 – 2022-09-20 (×5): 40 mg via INTRAVENOUS
  Filled 2022-09-16 (×5): qty 10

## 2022-09-16 MED ORDER — ALBUTEROL SULFATE (2.5 MG/3ML) 0.083% IN NEBU: 2.5000 mg | INHALATION_SOLUTION | Freq: Four times a day (QID) | RESPIRATORY_TRACT | Status: DC | PRN

## 2022-09-16 MED ORDER — TAMSULOSIN HCL 0.4 MG PO CAPS
0.4000 mg | ORAL_CAPSULE | Freq: Every day | ORAL | Status: DC
  Administered 2022-09-16 – 2022-09-19 (×4): 0.4 mg via ORAL
  Filled 2022-09-16 (×5): qty 1

## 2022-09-16 MED ORDER — SODIUM ZIRCONIUM CYCLOSILICATE 10 G PO PACK
10.0000 g | PACK | Freq: Once | ORAL | Status: DC
  Filled 2022-09-16: qty 1

## 2022-09-16 MED ORDER — ONDANSETRON HCL 4 MG PO TABS: 4.0000 mg | ORAL_TABLET | Freq: Four times a day (QID) | ORAL | Status: DC | PRN

## 2022-09-16 MED ORDER — POLYETHYLENE GLYCOL 3350 17 G PO PACK: 17.0000 g | PACK | Freq: Every day | ORAL | Status: DC | PRN

## 2022-09-16 MED ORDER — INSULIN ASPART 100 UNIT/ML IV SOLN
5.0000 [IU] | Freq: Once | INTRAVENOUS | Status: AC
  Administered 2022-09-16: 5 [IU] via INTRAVENOUS

## 2022-09-16 MED ORDER — SODIUM CHLORIDE 0.9 % IV SOLN
1.0000 g | INTRAVENOUS | Status: DC
  Administered 2022-09-16: 1 g via INTRAVENOUS
  Filled 2022-09-16 (×3): qty 10

## 2022-09-16 MED ORDER — SODIUM CHLORIDE 0.9% FLUSH
3.0000 mL | Freq: Two times a day (BID) | INTRAVENOUS | Status: DC
  Administered 2022-09-16 – 2022-09-20 (×5): 3 mL via INTRAVENOUS

## 2022-09-16 NOTE — Evaluation (Signed)
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 No pertinent surgical history. 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 Patient resides at [facility].    Assessment / Plan / Recommendation  Clinical Impression  Pt presents with functional swallow at bedside. He was alert throughout encounter, but presents as slightly confused. He did not participate in oral motor assessment, however, he did open his mouth on command. Pt appears to be missing dentition. Initially, pt verbally declined to participate in PO trials, however, when presented with the straw he opened his mouth and took a sip of thin liquids. Trials of thin liquids, puree and regular consistency were given, no s/sx of aspiration observed, despite pt taking consecutive large sips of thin liquids. Recommend Dys 3 (chopped meats) given posterior missing dentition, thin liquid diet, pills whole with liquid. No further SLP treatment required. SLP Visit Diagnosis: Dysphagia, unspecified (R13.10)    Aspiration Risk  Mild aspiration risk    Diet Recommendation Thin liquid;Dysphagia 3 (Mech soft)   Liquid  Administration via: Cup;Straw Medication Administration: Whole meds with liquid Supervision: Staff to assist with self feeding Compensations: Slow rate;Small sips/bites Postural Changes: Seated upright at 90 degrees    Other  Recommendations Oral Care Recommendations: Oral care BID    Recommendations for follow up therapy are one component of a multi-disciplinary discharge planning process, led by the attending physician.  Recommendations may be updated based on patient status, additional functional criteria and insurance authorization.  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 Patient resides at [facility]. Type of Study: Bedside Swallow Evaluation Previous Swallow Assessment:  (none found) Diet Prior to this Study: NPO Temperature Spikes Noted: No Respiratory Status: Room air History of Recent Intubation: No Behavior/Cognition: Alert;Confused Oral Cavity Assessment: Within Functional Limits Oral Care Completed by SLP: No Oral Cavity - Dentition: Missing dentition Vision: Functional for self-feeding Self-Feeding Abilities: Needs assist Patient Positioning: Upright in bed Baseline Vocal Quality: Normal Volitional Cough: Cognitively unable to elicit Volitional Swallow:  (Unwilling to participate in oral motor assessment)  Oral/Motor/Sensory Function Overall Oral Motor/Sensory Function: Other (comment) (Unwilling to complete assessment)   Ice Chips Ice chips: Not tested   Thin Liquid Thin Liquid: Within functional  limits Presentation: Straw    Nectar Thick Nectar Thick Liquid: Not tested   Honey Thick Honey Thick Liquid: Not tested   Puree Puree: Within functional limits Presentation: Spoon   Solid     Solid: Within functional limits     Gearlean Alf Student SLP  09/16/2022,12:23 PM

## 2022-09-16 NOTE — H&P (Addendum)
History and Physical    Patient: Vincent Cummings [id number] DOB: 08-11-1946 DOA: 09/15/2022 DOS: the patient was seen and examined on 09/16/2022 PCP: Elmore Guise, MD  Patient coming from: [facility] via EMS  Chief Complaint:  Chief Complaint  Patient presents with   Altered Mental Status   HPI: Vincent Cummings is a 76 y.o. male with medical history significant of HTN, history of respiratory failure with hypoxia secondary to COVID-19 infection, and CKD stage II/IIIa who presents after being noted to be laying around more than usual.  At baseline patient reported to walk the halls.  History is obtained mostly from review of records.  Patient noted no complaints of pain at this time and stated to leave him alone.  Apparently, patient had been having some dark tarry stools and reported to have new swelling of his left elbow.   The patient's son over the phone reports that his father never had a stroke to his knowledge, but does have dementia.  Son is his legal guardian and currently residing in [place], [place].  He had worsening cognitive decline after COVID-19 back in 2021.  At baseline patient is usually oriented to time or place really besides saying he is in [place] and has a roommate.  In the emergency department patient was noted to have rectal temperature 95.7 F with heart rates 107 126, respirations 16-26, and all other vital signs maintained.  Labs since 11/1 significant for WBC 16.9-> 12.1, potassium 7.2-> 5.2, CO2 14,  BUN 182, creatinine 13.67, anion gap 21, INR 1.4, lactic acid 2.3-> 2.2.  Urinalysis noted moderate hemoglobin and rare bacteria with without other significant signs for infection.  UDS did not note any acute abnormality.  Chest x-ray and imaging of his pelvis did not note any acute abnormality.  X-rays of the left elbow noted soft tissue mass thought possibly lipoma without acute fracture.  Fecal occult testing was positive.   PCCM has been consulted due  to patient's hyperkalemia.  Patient had been given a total 4 L of IV fluids, Lokelma 10 g p.o., amp of sodium bicarb, D50 amp, insulin, vancomycin, cefepime, and metronidazole.  CT scan of the head noted atrophy without acute intercranial abnormality.  CT scan of the abdomen pelvis without contrast noted bilateral nephrolithiasis with bilateral hydronephrosis without ureteral stones with the urinary bladder distended above the level umbilicus Foley catheter in place.     Review of Systems: Unable to review all systems due to lack of cooperation from patient. Past Medical History:  Diagnosis Date   Acute respiratory failure with hypoxia (HCC)    Cognitive communication deficit    Constipation    Dysphagia    Essential hypertension    GERD without esophagitis    Gout    History of [id number]    Metabolic encephalopathy    Muscle weakness    Other sequelae of cerebral infarction    Pneumonia due to COVID-19 virus 2019   History reviewed. No pertinent surgical history. Social History:  reports that he has never smoked. He has never used smokeless tobacco. He reports that he does not drink alcohol and does not use drugs.  Allergies  Allergen Reactions   Atorvastatin Itching and Rash    No family history on file.  Prior to Admission medications   Medication Sig Start Date End Date Taking? Authorizing Provider  acetaminophen (TYLENOL) 325 MG tablet Take 2 tablets (650 mg total) by mouth every 6 (six) hours as needed for mild  pain or headache (fever >/= 101). 03/04/20   Ghimire, Henreitta Leber, MD  albuterol (VENTOLIN HFA) 108 (90 Base) MCG/ACT inhaler Inhale 2 puffs into the lungs every 4 (four) hours as needed for wheezing or shortness of breath. 03/04/20   Ghimire, Henreitta Leber, MD  amLODipine (NORVASC) 5 MG tablet Take 5 mg by mouth daily.    [provider]  aspirin EC 81 MG tablet Take 1 tablet (81 mg total) by mouth daily. 03/04/20   Ghimire, Henreitta Leber, MD  benazepril (LOTENSIN) 10 MG  tablet Take 10 mg by mouth daily.    [provider]  bisacodyl (DULCOLAX) 10 MG suppository Place 10 mg rectally daily as needed for moderate constipation.    [provider]  cloNIDine (CATAPRES - DOSED IN MG/24 HR) 0.1 mg/24hr patch Place 0.1 mg onto the skin once a week.    [provider]  divalproex (DEPAKOTE) 125 MG DR tablet Take 125 mg by mouth 2 (two) times daily.    [provider]  magnesium hydroxide (MILK OF MAGNESIA) 400 MG/5ML suspension Take 30 mLs by mouth daily as needed for mild constipation.    [provider]  metoprolol tartrate (LOPRESSOR) 25 MG tablet Take 25 mg by mouth 2 (two) times daily.    [provider]  NON FORMULARY DIET: REGULAR, [place]    [provider]  ondansetron (ZOFRAN) 4 MG tablet Take 1 tablet (4 mg total) by mouth every 6 (six) hours as needed for nausea. 03/04/20   Ghimire, Henreitta Leber, MD  polyethylene glycol (MIRALAX / GLYCOLAX) 17 g packet Take 17 g by mouth daily as needed for mild constipation. 03/04/20   Ghimire, Henreitta Leber, MD  QUEtiapine (SEROQUEL) 25 MG tablet Take 12.5 mg by mouth 2 (two) times daily.    [provider]  Sodium Phosphates (RA SALINE ENEMA RE) Place 1 Dose rectally as needed.    [provider]  UNABLE TO FIND Med Name: Magic Cup once daily to help stabilize weight    [provider]    Physical Exam: Vitals:   09/16/22 0600 09/16/22 0630 09/16/22 0656 09/16/22 0700  BP: 110/78 108/81  113/78  Pulse: (!) 116 (!) 116  (!) 115  Resp: (!) 22 17  17   Temp:   (!) 97.4 F (36.3 C)   TempSrc:   Temporal   SpO2: 91% 92%  94%  Weight:      Height:       Exam  Constitutional: Elderly male currently in no acute distress with mittens on Eyes: PERRL, lids and conjunctivae normal ENMT: Mucous membranes are dry. Posterior pharynx clear of any exudate or lesions..  Agitation Neck: normal, supple  Respiratory: clear to auscultation  bilaterally, no wheezing, no crackles. Normal respiratory effort. No accessory muscle use.  Cardiovascular: Tachycardic no extremity edema. 2+ pedal pulses. No carotid bruits.  Abdomen: no tenderness, no masses palpated.  Bowel sounds positive.  Musculoskeletal: no clubbing / cyanosis.  Multiple soft tissue mass noted of the left elbow. Skin: no rashes, lesions, ulcers. No induration Neurologic: CN 2-12 grossly intact.  Able to move all extremities.  Speech is sometimes slurred. Psychiatric: Alert and oriented to person and states he is in [place]. Agitated mood at times asking to be left alone.   Data Reviewed:  EKG noted sinus tachycardia at 101 bpm with signs of T wave peaking in a lot of background artifact.  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  Baseline creatinine previously had ranged 1-1.3.  CT noted bilateral hydronephrosis with prostatomegaly.  Foley catheter was placed with over 2 L urine output. -Admit to a progressive bed -Continue Foley catheter -Continue sodium bicarb drip -Serial monitoring of kidney function -Avoid nephrotoxic agents -Flomax -Nephrology consulted, will follow-up for any additional recommendations  SIRS lactic acidosis Acute.  Patient was noted to be tachycardic with WBC elevated at 16.9 and lactic acid 2.3-> 2.2.  Urinalysis noted moderate hemoglobin with rare bacteria.  Blood and urine cultures had been obtained.  Patient has been started on empiric antibiotics of vancomycin, cefepime, and metronidazole.  Recheck 12.1 this morning. -Follow-up cultures -Continue to trend lactic acid levels -Plan to continue antibiotics and de-escalate if cultures negative  Acute metabolic encephalopathy Patient was noted to be acutely  altered by staff laying around more than usual when only ambulating in halls.  Patient noted to have BUN of 161 with metabolic acidosis with elevated anion gap. Ammonia, salicylate, acetaminophen levels within normal limits.  CT of the brain negative for any acute abnormality.  UDS negative.  Suspect patient symptoms likely related to uremia and/or infection. -Neurochecks  Hyperkalemia Acute.  Potassium elevated 7.2 with repeat check 5.2 given after given fluids, Lokelma, bicarb, insulin, and D50. -Continue to monitor for correction.  Fecal occult positive Acute.  Stool guaiacs were noted positive.  Hemoglobin 15.9->13.9 after patient had received aggressive IV fluid hydration.  Patient does not appear to be overtly bleeding. -Recheck H&H in 8 hours  -Hold aspirin -Resume when medically appropriate  Essential hypertension Blood pressures currently maintained Home blood pressure regimen includes amlodipine 5 mg daily, benazepril 10 mg daily, clonidine patch, and metoprolol 25 mg twice daily. -Hold benazepril due to acute renal failure -Continue clonidine and metoprolol -Resume amlodipine in a.m.  Elevated ALT Acute.  ALT 73 with all other disease relatively within normal limits. -Check CMP in a.m.  Thrombocytosis Acute.  Platelet count 494->404.  Thought to be reactive in nature. -Continue to monitor  Nephrolithiasis  Patient noted to have nephrolithiasis without signs of acute obstruction.  Dementia, with behavioral disturbance Patient with history of dementia as reported by the patient's son.  On his med history there appears that showed concern for prior stroke.  The patient's son denies any history of stroke and no signs of stroke noted on prior CT imaging or imaging obtained today. -Delirium precautions -Continue Depakote and Seroquel -PT/OT/speech to evaluate and treat -Determine if prior CVA needs to be deleted from med history  GI prophylaxis: Protonix DVT prophylaxis:  SCDs Advance Care Planning:   Code Status: DNR    Consults: PCCM, nephrology\  Family Communication: Son updated over the phone  Severity of Illness: The appropriate patient status for this patient is INPATIENT. Inpatient status is judged to be reasonable and necessary in order to provide the required intensity of service to ensure the patient's safety. The patient's presenting symptoms, physical exam findings, and initial radiographic and laboratory data in the context of their chronic comorbidities is felt to place them at high risk for further clinical deterioration. Furthermore, it is not anticipated that the patient will be medically stable for discharge from the hospital within 2 midnights of admission.   * I certify that at the point of admission it is my clinical judgment that the patient will  require inpatient hospital care spanning beyond 2 midnights from the point of admission due to high intensity of service, high risk for further deterioration and high frequency of surveillance required.*  Author: Norval Morton, MD 09/16/2022 7:34 AM  For on call review [email or web address].

## 2022-09-16 NOTE — Consult Note (Signed)
[facility]  HISTORY AND PHYSICAL  Vincent Cummings is an 76 y.o. male.    Chief Complaint: AMS  HPI: Pt is a 10M with a PMH sig for HTN, h/o CVA with dysarthria, GERD, gout, wheelchair-bound who is now seen in consultation at the request of Dr. Katrinka Blazing for eval and recs re: AKI and hyperkalemia.    Pt is not able to provide history, information gleaned from chart review.  Normally is able to propel himself in a wheelchair around the halls of his SNF.  He's been less responsive, laying in his bed, and so he was brought to the ED.   K was 7.2, Cr 13.7, and > 1L PVR.  Foley was placed in ED, 4.8L urine.  Repeat K 5.2.  In this setting we are asked to see.  UA with mod Hgb and rare bacteria, CT abd/ pelvis showed bilateral hydro and prostatomegaly, CT head negative for acute process and has chronic microvascular disease.    Not able to get a good ROS on him- says "dont bother me" to everything.    PMH: Past Medical History:  Diagnosis Date   Acute respiratory failure with hypoxia (HCC)    Cognitive communication deficit    Constipation    Dysphagia    Essential hypertension    GERD without esophagitis    Gout    History of COVID-19    Metabolic encephalopathy    Muscle weakness    Other sequelae of cerebral infarction    Pneumonia due to COVID-19 virus 2019   PSH: History reviewed. No pertinent surgical history.   Past Medical History:  Diagnosis Date   Acute respiratory failure with hypoxia (HCC)    Cognitive communication deficit    Constipation    Dysphagia    Essential hypertension    GERD without esophagitis    Gout    History of COVID-19    Metabolic encephalopathy    Muscle weakness    Other sequelae of cerebral infarction    Pneumonia due to COVID-19 virus 2019    Medications:  Home meds reviewed and include Amlodipine 5 mg daily Lotensin 10 mg dialy Clonidine patch MOM and fleets enemas Metoprolol 25 mg BID     ALLERGIES:   Allergies   Allergen Reactions   Atorvastatin Itching and Rash    FAM HX: History reviewed. No pertinent family history.  Social History:   reports that he has never smoked. He has never used smokeless tobacco. He reports that he does not drink alcohol and does not use drugs.  ROS: ROS: not able to obtain- says "dont bother me"  Blood pressure 112/81, pulse (!) 25, temperature (!) 97.4 F (36.3 C), temperature source Temporal, resp. rate 16, height 5\' 8"  (1.727 m), weight 79.4 kg, SpO2 97 %. PHYSICAL EXAM: Physical Exam GEN: NAD, appears chronically ill HEENT very dry MM  NECK no JVD PULM clear CV RRR ABD soft EXT sarcopenic, 1+ LE edema NEURO + dysrthria, disoriented, says "dont bother me" SKIN dry with flaky peeling skin on legs    Results for orders placed or performed during the hospital encounter of 09/15/22 (from the past 48 hour(s))  Lactic acid, plasma     Status: Abnormal   Collection Time: 09/15/22 10:22 PM  Result Value Ref Range   Lactic Acid, Venous 2.3 (HH) 0.5 - 1.9 mmol/L    Comment: CRITICAL RESULT CALLED TO, READ BACK BY AND VERIFIED WITH G. TATE, RN, 0050 09/16/22, A. RAMSEY Performed  at [facility], [address] [place], [place] [address]   Urinalysis, Routine w reflex microscopic     Status: Abnormal   Collection Time: 09/15/22 10:22 PM  Result Value Ref Range   Color, Urine YELLOW YELLOW   APPearance CLEAR CLEAR   Specific Gravity, Urine 1.011 1.005 - 1.030   pH 5.0 5.0 - 8.0   Glucose, UA NEGATIVE NEGATIVE mg/dL   Hgb urine dipstick MODERATE (A) NEGATIVE   Bilirubin Urine NEGATIVE NEGATIVE   Ketones, ur NEGATIVE NEGATIVE mg/dL   Protein, ur NEGATIVE NEGATIVE mg/dL   Nitrite NEGATIVE NEGATIVE   Leukocytes,Ua NEGATIVE NEGATIVE   RBC / HPF 0-5 0 - 5 RBC/hpf   WBC, UA 0-5 0 - 5 WBC/hpf   Bacteria, UA RARE (A) NONE SEEN   Squamous Epithelial / LPF 0-5 0 - 5    Comment: Performed at [facility], [address], [place], [place] [address]   POC occult blood, ED     Status: Abnormal   Collection Time: 09/15/22 10:22 PM  Result Value Ref Range   Fecal Occult Bld POSITIVE (A) NEGATIVE  Blood Culture (routine x 2)     Status: None (Preliminary result)   Collection Time: 09/15/22 11:10 PM   Specimen: BLOOD LEFT FOREARM  Result Value Ref Range   Specimen Description BLOOD LEFT FOREARM    Special Requests      BOTTLES DRAWN AEROBIC AND ANAEROBIC Blood Culture results may not be optimal due to an inadequate volume of blood received in culture bottles   Culture      NO GROWTH < 12 HOURS Performed at [facility], [address], [place], [place] [address]    Report Status PENDING   CBC with Differential     Status: Abnormal   Collection Time: 09/15/22 11:15 PM  Result Value Ref Range   WBC 16.9 (H) 4.0 - 10.5 K/uL   RBC 5.04 4.22 - 5.81 MIL/uL   Hemoglobin 15.9 13.0 - 17.0 g/dL   HCT 32.9 92.4 - 26.8 %   MCV 91.1 80.0 - 100.0 fL   MCH 31.5 26.0 - 34.0 pg   MCHC 34.6 30.0 - 36.0 g/dL   RDW 34.1 96.2 - 22.9 %   Platelets 490 (H) 150 - 400 K/uL   nRBC 0.0 0.0 - 0.2 %   Neutrophils Relative % 85 %   Neutro Abs 14.6 (H) 1.7 - 7.7 K/uL   Lymphocytes Relative 5 %   Lymphs Abs 0.8 0.7 - 4.0 K/uL   Monocytes Relative 8 %   Monocytes Absolute 1.3 (H) 0.1 - 1.0 K/uL   Eosinophils Relative 0 %   Eosinophils Absolute 0.0 0.0 - 0.5 K/uL   Basophils Relative 0 %   Basophils Absolute 0.0 0.0 - 0.1 K/uL   Immature Granulocytes 2 %   Abs Immature Granulocytes 0.25 (H) 0.00 - 0.07 K/uL    Comment: Performed at [facility], [address] [place], [place] [address]  Blood Culture (routine x 2)     Status: None (Preliminary result)   Collection Time: 09/15/22 11:15 PM   Specimen: BLOOD RIGHT FOREARM  Result Value Ref Range   Specimen Description BLOOD RIGHT FOREARM    Special Requests      BOTTLES DRAWN AEROBIC AND ANAEROBIC Blood Culture results may not be optimal due to an inadequate volume of blood received in  culture bottles   Culture      NO GROWTH <  12 HOURS Performed at [facility], [place] [address] [place], [place] [address]    Report Status PENDING   Valproic acid level     Status: Abnormal   Collection Time: 09/15/22 11:15 PM  Result Value Ref Range   Valproic Acid Lvl <10 (L) 50.0 - 100.0 ug/mL    Comment: RESULTS CONFIRMED BY MANUAL DILUTION Performed at [place] [address] [place], [place] [address]   Resp Panel by RT-PCR (Flu A&B, Covid) Anterior Nasal Swab     Status: None   Collection Time: 09/15/22 11:20 PM   Specimen: Anterior Nasal Swab  Result Value Ref Range   SARS Coronavirus 2 by RT PCR NEGATIVE NEGATIVE    Comment: (NOTE) SARS-CoV-2 target nucleic acids are NOT DETECTED.  The SARS-CoV-2 RNA is generally detectable in upper respiratory specimens during the acute phase of infection. The lowest concentration of SARS-CoV-2 viral copies this assay can detect is 138 copies/mL. A negative result does not preclude SARS-Cov-2 infection and should not be used as the sole basis for treatment or other patient management decisions. A negative result may occur with  improper specimen collection/handling, submission of specimen other than nasopharyngeal swab, presence of viral mutation(s) within the areas targeted by this assay, and inadequate number of viral copies(<138 copies/mL). A negative result must be combined with clinical observations, patient history, and epidemiological information. The expected result is Negative.  Fact Sheet for Patients:  EntrepreneurPulse.com.au  Fact Sheet for Healthcare Providers:  IncredibleEmployment.be  This test is no t yet approved or cleared by the [place] FDA and  has been authorized for detection and/or diagnosis of SARS-CoV-2 by FDA under an Emergency Use Authorization (EUA). This EUA will remain  in effect (meaning this test can be used) for the duration of  the COVID-19 declaration under Section 564(b)(1) of the Act, 21 U.S.C.section 360bbb-3(b)(1), unless the authorization is terminated  or revoked sooner.       Influenza A by PCR NEGATIVE NEGATIVE   Influenza B by PCR NEGATIVE NEGATIVE    Comment: (NOTE) The Xpert Xpress SARS-CoV-2/FLU/RSV plus assay is intended as an aid in the diagnosis of influenza from Nasopharyngeal swab specimens and should not be used as a sole basis for treatment. Nasal washings and aspirates are unacceptable for Xpert Xpress SARS-CoV-2/FLU/RSV testing.  Fact Sheet for Patients: EntrepreneurPulse.com.au  Fact Sheet for Healthcare Providers: IncredibleEmployment.be  This test is not yet approved or cleared by the [place] FDA and has been authorized for detection and/or diagnosis of SARS-CoV-2 by FDA under an Emergency Use Authorization (EUA). This EUA will remain in effect (meaning this test can be used) for the duration of the COVID-19 declaration under Section 564(b)(1) of the Act, 21 U.S.C. section 360bbb-3(b)(1), unless the authorization is terminated or revoked.  Performed at [facility], [place] [address], [place], [place] [address]   POC occult blood, ED     Status: Abnormal   Collection Time: 09/15/22 11:46 PM  Result Value Ref Range   Fecal Occult Bld POSITIVE (A) NEGATIVE  Rapid urine drug screen (hospital performed)     Status: None   Collection Time: 09/15/22 11:55 PM  Result Value Ref Range   Opiates NONE DETECTED NONE DETECTED   Cocaine NONE DETECTED NONE DETECTED   Benzodiazepines NONE DETECTED NONE DETECTED   Amphetamines NONE DETECTED NONE DETECTED   Tetrahydrocannabinol NONE DETECTED NONE DETECTED   Barbiturates NONE DETECTED NONE DETECTED    Comment: (NOTE) DRUG SCREEN  FOR MEDICAL PURPOSES ONLY.  IF CONFIRMATION IS NEEDED FOR ANY PURPOSE, NOTIFY LAB WITHIN 5 DAYS.  LOWEST DETECTABLE LIMITS FOR URINE DRUG SCREEN Drug Class                      Cutoff (ng/mL) Amphetamine and metabolites    1000 Barbiturate and metabolites    200 Benzodiazepine                 200 Opiates and metabolites        300 Cocaine and metabolites        300 THC                            50 Performed at [facility], [address] [place], [place] [address]   Protime-INR     Status: Abnormal   Collection Time: 09/16/22 12:15 AM  Result Value Ref Range   Prothrombin Time 16.6 (H) 11.4 - 15.2 seconds   INR 1.4 (H) 0.8 - 1.2    Comment: (NOTE) INR goal varies based on device and disease states. Performed at [facility], [address], [place], [place] [address]   APTT     Status: None   Collection Time: 09/16/22 12:15 AM  Result Value Ref Range   aPTT 34 24 - 36 seconds    Comment: Performed at [facility], [address] [place], [place] [address]  Lactic acid, plasma     Status: Abnormal   Collection Time: 09/16/22 12:22 AM  Result Value Ref Range   Lactic Acid, Venous 2.2 (HH) 0.5 - 1.9 mmol/L    Comment: CRITICAL VALUE NOTED.  VALUE IS CONSISTENT WITH PREVIOUSLY REPORTED AND CALLED VALUE. Performed at [facility], [address], [place], [place] [address]   Comprehensive metabolic panel     Status: Abnormal   Collection Time: 09/16/22 12:35 AM  Result Value Ref Range   Sodium 135 135 - 145 mmol/L   Potassium 7.2 (HH) 3.5 - 5.1 mmol/L    Comment: HEMOLYSIS AT THIS LEVEL MAY AFFECT RESULT CRITICAL RESULT CALLED TO, READ BACK BY AND VERIFIED WITH G. TATE, RN, 0141 09/16/22, A. RAMSEY    Chloride 100 98 - 111 mmol/L   CO2 14 (L) 22 - 32 mmol/L   Glucose, Bld 107 (H) 70 - 99 mg/dL    Comment: Glucose reference range applies only to samples taken after fasting for at least 8 hours.   BUN 182 (H) 8 - 23 mg/dL   Creatinine, Ser 29.56 (H) 0.61 - 1.24 mg/dL   Calcium 8.7 (L) 8.9 - 10.3 mg/dL   Total Protein 6.7 6.5 - 8.1 g/dL   Albumin 2.3 (L) 3.5 - 5.0 g/dL   AST 34 15 - 41 U/L    Comment:  HEMOLYSIS AT THIS LEVEL MAY AFFECT RESULT   ALT 73 (H) 0 - 44 U/L    Comment: HEMOLYSIS AT THIS LEVEL MAY AFFECT RESULT   Alkaline Phosphatase 88 38 - 126 U/L   Total Bilirubin 0.7 0.3 - 1.2 mg/dL    Comment: HEMOLYSIS AT THIS LEVEL MAY AFFECT RESULT   GFR, Estimated 3 (L) >60 mL/min    Comment: (NOTE) Calculated using the CKD-EPI Creatinine Equation (2021)    Anion gap 21 (H) 5 - 15    Comment: Electrolytes repeated to confirm. Performed at [facility], [address], [place],  [place] [address]   CBG monitoring, ED     Status: None   Collection Time: 09/16/22  2:24 AM  Result Value Ref Range   Glucose-Capillary 95 70 - 99 mg/dL    Comment: Glucose reference range applies only to samples taken after fasting for at least 8 hours.  Acetaminophen level     Status: Abnormal   Collection Time: 09/16/22  2:40 AM  Result Value Ref Range   Acetaminophen (Tylenol), Serum <10 (L) 10 - 30 ug/mL    Comment: (NOTE) Therapeutic concentrations vary significantly. A range of 10-30 ug/mL  may be an effective concentration for many patients. However, some  are best treated at concentrations outside of this range. Acetaminophen concentrations >150 ug/mL at 4 hours after ingestion  and >50 ug/mL at 12 hours after ingestion are often associated with  toxic reactions.  Performed at [facility], [address], [place], [place] [address]   Salicylate level     Status: Abnormal   Collection Time: 09/16/22  2:40 AM  Result Value Ref Range   Salicylate Lvl <7.0 (L) 7.0 - 30.0 mg/dL    Comment: Performed at [facility], [address] [place], [place] [address]  Ethanol     Status: None   Collection Time: 09/16/22  2:40 AM  Result Value Ref Range   Alcohol, Ethyl (B) <10 <10 mg/dL    Comment: (NOTE) Lowest detectable limit for serum alcohol is 10 mg/dL.  For medical purposes only. Performed at [facility], [address], [place], [place] [address]   Ammonia      Status: None   Collection Time: 09/16/22  2:40 AM  Result Value Ref Range   Ammonia 33 9 - 35 umol/L    Comment: Performed at [facility], [address] [place], [place] [address]  CBG monitoring, ED     Status: Abnormal   Collection Time: 09/16/22  3:00 AM  Result Value Ref Range   Glucose-Capillary 170 (H) 70 - 99 mg/dL    Comment: Glucose reference range applies only to samples taken after fasting for at least 8 hours.  Glucose, random     Status: Abnormal   Collection Time: 09/16/22  3:24 AM  Result Value Ref Range   Glucose, Bld 176 (H) 70 - 99 mg/dL    Comment: Glucose reference range applies only to samples taken after fasting for at least 8 hours. Performed at [facility], [address] [place], [place] [address]   Potassium     Status: Abnormal   Collection Time: 09/16/22  3:24 AM  Result Value Ref Range   Potassium 5.2 (H) 3.5 - 5.1 mmol/L    Comment: Performed at [facility], [address], [place], [place] [address]  CBG monitoring, ED     Status: Abnormal   Collection Time: 09/16/22  3:59 AM  Result Value Ref Range   Glucose-Capillary 104 (H) 70 - 99 mg/dL    Comment: Glucose reference range applies only to samples taken after fasting for at least 8 hours.  CBG monitoring, ED     Status: Abnormal   Collection Time: 09/16/22  5:00 AM  Result Value Ref Range   Glucose-Capillary 123 (H) 70 - 99 mg/dL    Comment: Glucose reference range applies only to samples taken after fasting for at least 8 hours.  Magnesium     Status: None   Collection Time: 09/16/22  7:00 AM  Result Value Ref Range   Magnesium 2.2 1.7 - 2.4 mg/dL    Comment: Performed at [facility], [address] [place], [place] [address]  Phosphorus     Status: Abnormal   Collection Time: 09/16/22  7:00 AM  Result Value Ref Range   Phosphorus 5.1 (H) 2.5 - 4.6 mg/dL    Comment: Performed at [facility], [address] [place], [place] [address]  CBC with  Differential/Platelet     Status: Abnormal   Collection Time: 09/16/22  7:00 AM  Result Value Ref Range   WBC 12.1 (H) 4.0 - 10.5 K/uL   RBC 4.36 4.22 - 5.81 MIL/uL   Hemoglobin 13.9 13.0 - 17.0 g/dL   HCT 29.539.4 28.439.0 - 13.252.0 %   MCV 90.4 80.0 - 100.0 fL   MCH 31.9 26.0 - 34.0 pg   MCHC 35.3 30.0 - 36.0 g/dL   RDW 44.013.6 10.211.5 - 72.515.5 %   Platelets 404 (H) 150 - 400 K/uL   nRBC 0.0 0.0 - 0.2 %   Neutrophils Relative % 86 %   Neutro Abs 10.2 (H) 1.7 - 7.7 K/uL   Lymphocytes Relative 5 %   Lymphs Abs 0.6 (L) 0.7 - 4.0 K/uL   Monocytes Relative 8 %   Monocytes Absolute 1.0 0.1 - 1.0 K/uL   Eosinophils Relative 0 %   Eosinophils Absolute 0.1 0.0 - 0.5 K/uL   Basophils Relative 0 %   Basophils Absolute 0.0 0.0 - 0.1 K/uL   Immature Granulocytes 1 %   Abs Immature Granulocytes 0.12 (H) 0.00 - 0.07 K/uL    Comment: Performed at [facility], [address] [place], [place] [address]  Renal function panel     Status: Abnormal   Collection Time: 09/16/22  7:48 AM  Result Value Ref Range   Sodium 144 135 - 145 mmol/L   Potassium 5.2 (H) 3.5 - 5.1 mmol/L    Comment: HEMOLYSIS AT THIS LEVEL MAY AFFECT RESULT   Chloride 110 98 - 111 mmol/L   CO2 22 22 - 32 mmol/L   Glucose, Bld 169 (H) 70 - 99 mg/dL    Comment: Glucose reference range applies only to samples taken after fasting for at least 8 hours.   BUN 120 (H) 8 - 23 mg/dL   Creatinine, Ser 0.347.18 (H) 0.61 - 1.24 mg/dL   Calcium 8.5 (L) 8.9 - 10.3 mg/dL   Phosphorus 5.2 (H) 2.5 - 4.6 mg/dL   Albumin 2.0 (L) 3.5 - 5.0 g/dL   GFR, Estimated 7 (L) >60 mL/min    Comment: (NOTE) Calculated using the CKD-EPI Creatinine Equation (2021)    Anion gap 12 5 - 15    Comment: Performed at [facility], [address] [place], [place] [address]  Lactic acid, plasma     Status: None   Collection Time: 09/16/22  8:15 AM  Result Value Ref Range   Lactic Acid, Venous 1.5 0.5 - 1.9 mmol/L    Comment: Performed at [facility],  [address] [place], [place] [address]  CBG monitoring, ED     Status: Abnormal   Collection Time: 09/16/22  8:29 AM  Result Value Ref Range   Glucose-Capillary 101 (H) 70 - 99 mg/dL    Comment: Glucose reference range applies only to samples taken after fasting for at least 8 hours.    CT ABDOMEN PELVIS WO CONTRAST  Result Date: 09/16/2022 CLINICAL DATA:  Abdominal  pain, acute, nonlocalized shortness of breath, abdominal pain EXAM: CT ABDOMEN AND PELVIS WITHOUT CONTRAST TECHNIQUE: Multidetector CT imaging of the abdomen and pelvis was performed following the standard protocol without IV contrast. RADIATION DOSE REDUCTION: This exam was performed according to the departmental dose-optimization program which includes automated exposure control, adjustment of the mA and/or kV according to patient size and/or use of iterative reconstruction technique. COMPARISON:  None Available. FINDINGS: Lower chest: Coronary artery and aortic calcifications. No acute abnormality Hepatobiliary: No focal hepatic abnormality. Gallbladder unremarkable. Pancreas: No focal abnormality or ductal dilatation. Spleen: No focal abnormality.  Normal size. Adrenals/Urinary Tract: Bilateral nonobstructing renal stones. Bilateral hydronephrosis. No ureteral stones seen. Urinary bladder is distended above the level of the umbilicus. Foley catheter in place. Adrenal glands unremarkable. Stomach/Bowel: Normal appendix Stomach, large and small bowel grossly unremarkable. Vascular/Lymphatic: Aortic atherosclerosis. No evidence of aneurysm or adenopathy. Reproductive: Prostate enlargement Other: No free fluid or free air. Musculoskeletal: No acute bony abnormality. IMPRESSION: Bilateral nephrolithiasis. Bilateral hydronephrosis without ureteral stones. Urinary bladder is distended above the level of the umbilicus. Foley catheter in place. Prostate enlargement. Aortic atherosclerosis.  Coronary artery disease. Electronically Signed   By: Charlett Nose M.D.   On: 09/16/2022 03:50   CT HEAD WO CONTRAST ( )  Result Date: 09/16/2022 CLINICAL DATA:  Delirium EXAM: CT HEAD WITHOUT CONTRAST TECHNIQUE: Contiguous axial images were obtained from the base of the skull through the vertex without intravenous contrast. RADIATION DOSE REDUCTION: This exam was performed according to the departmental dose-optimization program which includes automated exposure control, adjustment of the mA and/or kV according to patient size and/or use of iterative reconstruction technique. COMPARISON:  02/27/2020 FINDINGS: Brain: There is atrophy and chronic small vessel disease changes. No acute intracranial abnormality. Specifically, no hemorrhage, hydrocephalus, mass lesion, acute infarction, or significant intracranial injury. Vascular: No hyperdense vessel or unexpected calcification. Skull: No acute calvarial abnormality. Sinuses/Orbits: No acute findings Other: None IMPRESSION: Atrophy, chronic microvascular disease. No acute intracranial abnormality. Electronically Signed   By: Charlett Nose M.D.   On: 09/16/2022 03:48   DG Chest Port 1 View  Result Date: 09/15/2022 CLINICAL DATA:  Questionable sepsis. Evaluate for infiltrate. Left elbow pain. EXAM: PORTABLE CHEST 1 VIEW LEFT ELBOW SERIES, 4 VIEWS COMPARISON:  Portable chest 02/26/2020. No prior left elbow series for comparison. FINDINGS: Chest: There are multiple overlying monitor wires mostly on the left. There is mild cardiomegaly with CHF pattern. No pleural effusion is seen. The lungs are clear. The mediastinum is stable with aortic calcification and mild tortuosity. There are moderate degenerative changes of the shoulders, mild degenerative change from dextroscoliosis thoracic spine. Bilateral basal patchy infiltrates noted previously have resolved in the interval. Left elbow four views: There is a sizable lucent subcutaneous soft tissue mass in the dorsolateral distal upper arm at the level of the distal humerus.  The mass is estimated to measure at least 8.0 x 2.3 x 1.1 cm in craniocaudal, transverse and AP axis. 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 MRI is recommended without and with contrast to assess for complicating features. There is no evidence of fracture, dislocation or joint effusion. Spurring is seen of the proximal radioulnar joint and a trochleoulnar joint. There is a curvilinear ossific body along side the lateral aspect of the radial head which is probably a dystrophic calcification due to old trauma or could be a loose body in the joint space. Prominent enthesopathic spurring arises from  the lateral humeral epicondyle, with trace medial epicondylar spurring. There is normal bone mineralization. IMPRESSION: 1. No acute radiographic chest findings.  Mild cardiomegaly. 2. Aortic atherosclerosis. 3. 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 MRI is recommended without and with contrast to assess for complicating features. 4. Degenerative left elbow changes without evidence of acute fracture or dislocation. 5. Dystrophic calcification versus loose body along side the lateral aspect of the radial head. 6. Lateral greater than medial epicondylar enthesopathy. Electronically Signed   By: Almira Bar M.D.   On: 09/15/2022 22:52   DG Elbow Complete Left  Result Date: 09/15/2022 CLINICAL DATA:  Questionable sepsis. Evaluate for infiltrate. Left elbow pain. EXAM: PORTABLE CHEST 1 VIEW LEFT ELBOW SERIES, 4 VIEWS COMPARISON:  Portable chest 02/26/2020. No prior left elbow series for comparison. FINDINGS: Chest: There are multiple overlying monitor wires mostly on the left. There is mild cardiomegaly with CHF pattern. No pleural effusion is seen. The lungs are clear. The mediastinum is stable with aortic calcification and mild  tortuosity. There are moderate degenerative changes of the shoulders, mild degenerative change from dextroscoliosis thoracic spine. Bilateral basal patchy infiltrates noted previously have resolved in the interval. Left elbow four views: There is a sizable lucent subcutaneous soft tissue mass in the dorsolateral distal upper arm at the level of the distal humerus. The mass is estimated to measure at least 8.0 x 2.3 x 1.1 cm in craniocaudal, transverse and AP axis. 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 MRI is recommended without and with contrast to assess for complicating features. There is no evidence of fracture, dislocation or joint effusion. Spurring is seen of the proximal radioulnar joint and a trochleoulnar joint. There is a curvilinear ossific body along side the lateral aspect of the radial head which is probably a dystrophic calcification due to old trauma or could be a loose body in the joint space. Prominent enthesopathic spurring arises from the lateral humeral epicondyle, with trace medial epicondylar spurring. There is normal bone mineralization. IMPRESSION: 1. No acute radiographic chest findings.  Mild cardiomegaly. 2. Aortic atherosclerosis. 3. 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 MRI is recommended without and with contrast to assess for complicating features. 4. Degenerative left elbow changes without evidence of acute fracture or dislocation. 5. Dystrophic calcification versus loose body along side the lateral aspect of the radial head. 6. Lateral greater than medial epicondylar enthesopathy. Electronically Signed   By: Almira Bar M.D.   On: 09/15/2022 22:52   DG Pelvis Portable  Result Date: 09/15/2022 CLINICAL DATA:  Questionable sepsis - evaluate for abnormality EXAM: PORTABLE PELVIS 1-2  VIEWS COMPARISON:  None Available. FINDINGS: Limited evaluation due to overlapping osseous structures and overlying soft tissues. Question cortical irregularity of the L5-S1 level. Degenerative changes visualized lower lumbar spine. No acute displaced fracture dislocation of the hips on frontal view. No aggressive appearing pelvic bone lesions are seen. IMPRESSION: Limited evaluation due to overlapping osseous structures and overlying soft tissues. Question cortical irregularity of the L5-S1 level. Consider CT pelvis for further evaluation. Electronically Signed   By: Tish Frederickson M.D.   On: 09/15/2022 22:41    Assessment/Plan  AKI: secondary to obstructive uropathy, hydro on imaging  - Foley  placed, already great UOP with improving Cr/ BUN and K  - continue bicarb gtt for now, likely can d/c by this PM  - LA is 1.5, can bolus isotonic fluids if needed  - no indication for dialysis and pt is a very poor candidate for short or long- term dialysis  2.  Hyperkalemia:  - improving with aggressive K lowering measures and with relief of obstruction  3.  Sepsis:  - likely urinary source  - vanc/ cefepime  4.  Chronic constipation  - MOM and Fleets enemas on home list- please do not give these in setting of AKI  - can use miralax, dulcolax, miralax, and lactulose  5. Dispo: admitted  Bufford Buttner 09/16/2022, 9:36 AM

## 2022-09-16 NOTE — ED Notes (Signed)
Patient kept spitting pills out. RN crushed and mixed with applesauce and patient tolerated it better.

## 2022-09-16 NOTE — Progress Notes (Signed)
  Carryover admission to the Day Admitter.  I discussed this case with the EDP, Montine Circle, PA .  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   Presenting labs notable for elevated BUN, creatinine 13, potassium 7.2, which reportedly showed a very small amount hemolysis.  Bladder scan showed greater than 1 liter PVR, prompting placement of Foley catheter with ensuing good urine output.   EDP discussed patient's case with on-call PCCM, who evaluated the patient in the emergency department this evening and formally consulted, recommending admission to the hospitalist service with additional recommendations as included in the formal consultation, including Initiation of bicarbonate drip, which pccm has ordered.   Additionally, EDP discussed patient's case with the on-call nephrologist, Dr. Carolin Sicks, who conveyed that there is no indication for urgent/emergent hemodialysis at this time.  Rather, Dr. Carolin Sicks recommended aggressive IV fluids, specifically recommending at least 4 L of IV fluid as well as medical management of hyperkalemia, conveying that he will consult and see the patient in the morning.  The patient has received a total of 4 L IV fluid, including 2 L of lactated Ringer's and 2 L of normal saline, in addition to ongoing receipt of bicarbonate drip as ordered by PCCM.  He has also received Lokelma, calcium gluconate, and IV insulin.    Via paperwork provided by SNF, the patient is DNR/DNI.  I ordered repeat serum potassium level via a renal function panel as well as additional morning labs in the form of CBC, serum magnesium/phosphorus levels.  I have placed an order  for inpatient admission to PCU (sdu) for further evaluation and management of the above.  I have placed some additional preliminary admit orders via the adult multi-morbid admission order set.    Babs Bertin, DO Hospitalist

## 2022-09-16 NOTE — ED Notes (Signed)
To CT.  RN found pt's output looked low however upon further evaluation, it turns out the foley was kinked.  As soon as RN corrected, pt began to have immediate drainage into the foley bag.

## 2022-09-16 NOTE — Consult Note (Signed)
NAME:  Vincent Cummings, MRN:  [id number], DOB:  [birth year], LOS: 0 ADMISSION DATE:  09/15/2022 CONSULTATION DATE:  09/16/2022 REFERRING MD:  Dahlia Client, ED-PA CHIEF COMPLAINT:  AMS   History of Present Illness:  Vincent Cummings is a 76 y.o. year-old male presented to [facility] ED on 11/1 with c/o of SHOB and generalized pain. Patient resides at [facility] where staff reports he is normally ambulatory in the halls, however, they noticed that he was lying around more than his normal. Staff reported that he has had episodes of black tarry stools and new swelling noted to his left elbow.  Patient has a PMH including but not limited to HTN, ARF w/ hypoxia, CVA (cognitive communication defect and dysphagia), CKD stage II, GERD and Constipation.   On arrival to the emergency department patient was agitated.  Vital signs on arrival 111/84 (92), 107HR, 26RR, Rectal Temp 95.7.  Labs were notable for WBC 16.9, H&H/platelets WNL, K+ 7.2 Cr 13.67 BUN 182 (questionable hemolyzed v bladder obstruction).  LA 2.3>2.2. ALT 73. PT 16.6/INR 1.4. UA showed hgb and rare bacteria. Blood Cultures pending. Empiric ABX started including vancomycin, Flagyl, cefepime. Patient also received 4L LR, along with chemical shift of K. Foley catheter placed in the ED with 1L output on placement.   Imaging including x-ray Pelvis, Chest unremarkable. Xray of Left Elbow resembling soft tissue mass in the dorsolateral distal upper arm, most likely a lipoma.  CT abdomen pelvis and head pending results. PCCM consulted for assistance of management of hyperkalemia.   Pertinent Medical History:   Past Medical History:  Diagnosis Date   Acute respiratory failure with hypoxia (HCC)    Cognitive communication deficit    Constipation    Dysphagia    Essential hypertension    GERD without esophagitis    Gout    History of COVID-19    Metabolic encephalopathy    Muscle weakness    Other sequelae of cerebral infarction     Pneumonia due to COVID-19 virus 2019   Significant Hospital Events: Including procedures, antibiotic start and stop dates in addition to other pertinent events   11/1- Presented to [facility] ED from SNF, AMS 11/2- PCCM consulted for assistance with management   Interim History / Subjective:  PCCM consulted as above  Objective:  Blood pressure 130/80, pulse (!) 124, temperature 97.9 F (36.6 C), temperature source Axillary, resp. rate (!) 24, height 5\' 8"  (1.727 m), weight 79.4 kg, SpO2 98 %.        Intake/Output Summary (Last 24 hours) at 09/16/2022 0411 Last data filed at 09/16/2022 0404 Gross per 24 hour  Intake 2200 ml  Output 3900 ml  Net -1700 ml   Filed Weights   09/15/22 2304  Weight: 79.4 kg    Physical Examination: General: Chronically ill-appearing elderly man in NAD. HEENT: [place]/AT, anicteric sclera, PERRL, moist mucous membranes. Neuro: Awake, oriented x 2. Responds to verbal stimuli. Following commands intermittently. .   CV: Tachycardic, Regular Rhythm , no m/g/r. PULM: Breathing even and unlabored on RA. Lung fields CTAB. GI: Firm, Generalized Tenderness to palpation, moderately distended in lower quadrants. Slightly hypoactive bowel sounds. Extremities: Trace bilaterally symmetric edema noted. Skin: Warm/dry, Chronic venous stasis changes noted BLE  Resolved Hospital Problem List:    Assessment & Plan:  Altered Mental Status in the setting of metabolic encephalopathy Presented 11/2 from SNF with AMS. Multiple electrolytes derangements in the setting of urinary obstruction.  - Hemodynamically Stable - does not  require ICU admission at this time - Status post fluid resuscitation - 4L  - CT Head NAICA - age associated atrophy  - Follow Up UDS and pending serum studies   Tachycardia Leukocytosis likely in the setting of urosepsis  Lactic Acidosis  - Fluid resuscitation ongoing  - Trend WBC, fever curve. LA 2.3>2.2 - F/u Cx data - Continue broad-spectrum  antibiotics   AKI in the setting of urinary obstruction Bilateral Hydronephrosis  CKD stage II  Hyperkalemia Azotemia - Nephology Consult Pending  - Trend BMP, K+ hemolyzed awaiting repeat  - Replete electrolytes as indicated - Continue Bicarb gtt - Monitor I&Os - F/u urine studies - Obtain renal US  - Avoid nephrotoxic agents as able - Ensure adequate renal perfusion  - Continue Foley post obstruction, consider urology consult if persistent   Abdominal distention Fecal occult positive GERD - CT A/P with significant bladder distention  - PPI  - Bowel Regimen  - Trend H&H  History of CVA Cognitive deficits  Dysphagia - PT/OT/SLP - when able to participate in care   Best Practice: (right click and "Reselect all SmartList Selections" daily)   Diet/type: NPO DVT prophylaxis: SCD GI prophylaxis: PPI Lines: N/A Foley:  Yes, and it is still needed Code Status:  DNR Last date of multidisciplinary goals of care discussion pending   Labs:  CBC: Recent Labs  Lab 09/15/22 2315  WBC 16.9*  NEUTROABS 14.6*  HGB 15.9  HCT 45.9  MCV 91.1  PLT 161*   Basic Metabolic Panel: Recent Labs  Lab 09/16/22 0035  NA 135  K 7.2*  CL 100  CO2 14*  GLUCOSE 107*  BUN 182*  CREATININE 13.67*  CALCIUM 8.7*   GFR: Estimated Creatinine Clearance: 4.5 mL/min (A) (by C-G formula based on SCr of 13.67 mg/dL (H)). Recent Labs  Lab 09/15/22 2222 09/15/22 2315 09/16/22 0022  WBC  --  16.9*  --   LATICACIDVEN 2.3*  --  2.2*   Liver Function Tests: Recent Labs  Lab 09/16/22 0035  AST 34  ALT 73*  ALKPHOS 88  BILITOT 0.7  PROT 6.7  ALBUMIN 2.3*   No results for input(s): "LIPASE", "AMYLASE" in the last 168 hours. Recent Labs  Lab 09/16/22 0240  AMMONIA 33    ABG: No results found for: "PHART", "PCO2ART", "PO2ART", "HCO3", "TCO2", "ACIDBASEDEF", "O2SAT"   Coagulation Profile: Recent Labs  Lab 09/16/22 0015  INR 1.4*   Cardiac Enzymes: No results for  input(s): "CKTOTAL", "CKMB", "CKMBINDEX", "TROPONINI" in the last 168 hours.  HbA1C: Hgb A1c MFr Bld  Date/Time Value Ref Range Status  02/26/2020 10:34 PM 5.6 4.8 - 5.6 % Final    Comment:    (NOTE)         Prediabetes: 5.7 - 6.4         Diabetes: >6.4         Glycemic control for adults with diabetes: <7.0    CBG: Recent Labs  Lab 09/16/22 0224 09/16/22 0300 09/16/22 0359  GLUCAP 95 170* 104*   Review of Systems:   Patient is encephalopathic Therefore history has been obtained from chart review.    Past Medical History:  He,  has a past medical history of Acute respiratory failure with hypoxia ([place]), Cognitive communication deficit, Constipation, Dysphagia, Essential hypertension, GERD without esophagitis, Gout, History of [id number], Metabolic encephalopathy, Muscle weakness, Other sequelae of cerebral infarction, and Pneumonia due to COVID-19 virus (2019).   Surgical History:  History reviewed. No pertinent surgical history.  Social History:   reports that he has never smoked. He has never used smokeless tobacco. He reports that he does not drink alcohol and does not use drugs.   Family History:  His family history is not on file.   Allergies: Allergies  Allergen Reactions   Atorvastatin Itching and Rash    Home Medications: Prior to Admission medications   Medication Sig Start Date End Date Taking? Authorizing Provider  acetaminophen (TYLENOL) 325 MG tablet Take 2 tablets (650 mg total) by mouth every 6 (six) hours as needed for mild pain or headache (fever >/= 101). 03/04/20   Ghimire, Werner Lean, MD  albuterol (VENTOLIN HFA) 108 (90 Base) MCG/ACT inhaler Inhale 2 puffs into the lungs every 4 (four) hours as needed for wheezing or shortness of breath. 03/04/20   Ghimire, Werner Lean, MD  amLODipine (NORVASC) 5 MG tablet Take 5 mg by mouth daily.    [provider]  aspirin EC 81 MG tablet Take 1 tablet (81 mg total) by mouth daily. 03/04/20   Ghimire, Werner Lean,  MD  benazepril (LOTENSIN) 10 MG tablet Take 10 mg by mouth daily.    [provider]  bisacodyl (DULCOLAX) 10 MG suppository Place 10 mg rectally daily as needed for moderate constipation.    [provider]  cloNIDine (CATAPRES - DOSED IN MG/24 HR) 0.1 mg/24hr patch Place 0.1 mg onto the skin once a week.    [provider]  divalproex (DEPAKOTE) 125 MG DR tablet Take 125 mg by mouth 2 (two) times daily.    [provider]  magnesium hydroxide (MILK OF MAGNESIA) 400 MG/5ML suspension Take 30 mLs by mouth daily as needed for mild constipation.    [provider]  metoprolol tartrate (LOPRESSOR) 25 MG tablet Take 25 mg by mouth 2 (two) times daily.    [provider]  NON FORMULARY DIET: REGULAR, THIN LIQUIDS    [provider]  ondansetron (ZOFRAN) 4 MG tablet Take 1 tablet (4 mg total) by mouth every 6 (six) hours as needed for nausea. 03/04/20   Ghimire, Werner Lean, MD  polyethylene glycol (MIRALAX / GLYCOLAX) 17 g packet Take 17 g by mouth daily as needed for mild constipation. 03/04/20   Ghimire, Werner Lean, MD  QUEtiapine (SEROQUEL) 25 MG tablet Take 12.5 mg by mouth 2 (two) times daily.    [provider]  Sodium Phosphates (RA SALINE ENEMA RE) Place 1 Dose rectally as needed.    [provider]  UNABLE TO FIND Med Name: Magic Cup once daily to help stabilize weight    [provider]    Critical care time: N/A   Faythe Ghee [facility] 09/16/22 4:11 AM  Please see [email or web address] for pager details.  From 7A-7P if no response, please call [phone] After hours, please call ELink [phone]

## 2022-09-16 NOTE — ED Notes (Signed)
Pt has seemed to calm down and appears to be resting comfortably.

## 2022-09-16 NOTE — Progress Notes (Signed)
Pharmacy Antibiotic Note  Vincent Cummings is a 76 y.o. male for which pharmacy has been consulted for cefepime and vancomycin dosing for sepsis. Patient presenting with AMS from SNF.  WBC trending down to 12.1 Lactic acid trending down to 1.5 Temperature increasing from 95.7 to 97.9 F SCr trending down from 13.67 to 7.18  Plan: Cefepime 1g IV q24h Vancomycin variable dosing per levels   >> continue to monitor SCr and order level ~48h or sooner pending trend   >> plan to redose vancomycin once trough level 15-20 Continue to renally dose adjust  Monitor daily CBC, temp, SCr, and for clinical signs of improvement  F/u cultures and de-escalate antibiotics as able   Height: 5\' 8"  (172.7 cm) Weight: 79.4 kg (175 lb) IBW/kg (Calculated) : 68.4  Temp (24hrs), Avg:97 F (36.1 C), Min:95.7 F (35.4 C), Max:97.9 F (36.6 C)  Recent Labs  Lab 09/15/22 2222 09/15/22 2315 09/16/22 0022 09/16/22 0035 09/16/22 0700 09/16/22 0748 09/16/22 0815  WBC  --  16.9*  --   --  12.1*  --   --   CREATININE  --   --   --  13.67*  --  7.18*  --   LATICACIDVEN 2.3*  --  2.2*  --   --   --  1.5    Estimated Creatinine Clearance: 8.6 mL/min (A) (by C-G formula based on SCr of 7.18 mg/dL (H)).    Allergies  Allergen Reactions   Atorvastatin Itching and Rash    Antimicrobials this admission: vancomycin 11/1 >>  cefepime 11/1 >>  flagyl 11/1 x1  Microbiology results: 11/1 Ur Cx: pending 11/1 Bld Cx x2: NGTD < 12h  Thank you for allowing pharmacy to be a part of this patient's care.  Luisa Hart, PharmD, BCPS Clinical Pharmacist 09/16/2022 9:35 AM   Please refer to AMION for pharmacy phone number

## 2022-09-17 DIAGNOSIS — E875 Hyperkalemia: Secondary | ICD-10-CM | POA: Diagnosis not present

## 2022-09-17 DIAGNOSIS — G9341 Metabolic encephalopathy: Secondary | ICD-10-CM | POA: Diagnosis not present

## 2022-09-17 DIAGNOSIS — N401 Enlarged prostate with lower urinary tract symptoms: Secondary | ICD-10-CM | POA: Diagnosis not present

## 2022-09-17 DIAGNOSIS — N179 Acute kidney failure, unspecified: Secondary | ICD-10-CM | POA: Diagnosis not present

## 2022-09-17 LAB — RENAL FUNCTION PANEL
Albumin: 2.1 g/dL — ABNORMAL LOW (ref 3.5–5.0)
Anion gap: 9 (ref 5–15)
BUN: 30 mg/dL — ABNORMAL HIGH (ref 8–23)
CO2: 20 mmol/L — ABNORMAL LOW (ref 22–32)
Calcium: 8.8 mg/dL — ABNORMAL LOW (ref 8.9–10.3)
Chloride: 117 mmol/L — ABNORMAL HIGH (ref 98–111)
Creatinine, Ser: 1.7 mg/dL — ABNORMAL HIGH (ref 0.61–1.24)
GFR, Estimated: 42 mL/min — ABNORMAL LOW (ref >60)
Glucose, Bld: 98 mg/dL (ref 70–99)
Phosphorus: 2.3 mg/dL — ABNORMAL LOW (ref 2.5–4.6)
Potassium: 5.3 mmol/L — ABNORMAL HIGH (ref 3.5–5.1)
Sodium: 146 mmol/L — ABNORMAL HIGH (ref 135–145)

## 2022-09-17 LAB — CBC
HCT: 42.3 % (ref 39.0–52.0)
Hemoglobin: 13.8 g/dL (ref 13.0–17.0)
MCH: 31.2 pg (ref 26.0–34.0)
MCHC: 32.6 g/dL (ref 30.0–36.0)
MCV: 95.7 fL (ref 80.0–100.0)
Platelets: 396 10*3/uL (ref 150–400)
RBC: 4.42 MIL/uL (ref 4.22–5.81)
RDW: 13.8 % (ref 11.5–15.5)
WBC: 11.4 10*3/uL — ABNORMAL HIGH (ref 4.0–10.5)
nRBC: 0 % (ref 0.0–0.2)

## 2022-09-17 LAB — VANCOMYCIN, RANDOM: Vancomycin Rm: 5 ug/mL

## 2022-09-17 MED ORDER — VANCOMYCIN HCL 1250 MG/250ML IV SOLN
1250.0000 mg | INTRAVENOUS | Status: DC
  Administered 2022-09-17: 1250 mg via INTRAVENOUS
  Filled 2022-09-17: qty 250

## 2022-09-17 MED ORDER — METOPROLOL TARTRATE 25 MG/10 ML ORAL SUSPENSION
25.0000 mg | Freq: Two times a day (BID) | ORAL | Status: DC
  Administered 2022-09-17: 25 mg via ORAL
  Filled 2022-09-17 (×2): qty 10

## 2022-09-17 MED ORDER — SODIUM CHLORIDE 0.9 % IV SOLN
2.0000 g | Freq: Two times a day (BID) | INTRAVENOUS | Status: DC
  Administered 2022-09-17: 2 g via INTRAVENOUS
  Filled 2022-09-17: qty 12.5

## 2022-09-17 MED ORDER — SODIUM CHLORIDE 0.9 % IV SOLN
1.0000 g | Freq: Once | INTRAVENOUS | Status: AC
  Administered 2022-09-17: 1 g via INTRAVENOUS
  Filled 2022-09-17: qty 10

## 2022-09-17 MED ORDER — SODIUM POLYSTYRENE SULFONATE 15 GM/60ML PO SUSP
30.0000 g | Freq: Once | ORAL | Status: AC
  Administered 2022-09-17: 30 g via ORAL
  Filled 2022-09-17 (×3): qty 120

## 2022-09-17 MED ORDER — DIVALPROEX SODIUM 125 MG PO CSDR
125.0000 mg | DELAYED_RELEASE_CAPSULE | Freq: Two times a day (BID) | ORAL | Status: DC
  Administered 2022-09-17: 125 mg via ORAL
  Filled 2022-09-17 (×2): qty 1

## 2022-09-17 MED ORDER — CHLORHEXIDINE GLUCONATE CLOTH 2 % EX PADS
6.0000 | MEDICATED_PAD | Freq: Every day | CUTANEOUS | Status: DC
  Administered 2022-09-17 – 2022-09-20 (×4): 6 via TOPICAL

## 2022-09-17 MED ORDER — SODIUM CHLORIDE 0.45 % IV SOLN: INTRAVENOUS | Status: AC

## 2022-09-17 MED ORDER — VANCOMYCIN HCL IN DEXTROSE 1-5 GM/200ML-% IV SOLN
1000.0000 mg | Freq: Once | INTRAVENOUS | Status: DC
  Filled 2022-09-17: qty 200

## 2022-09-17 MED ORDER — LACTATED RINGERS IV SOLN: INTRAVENOUS | Status: DC

## 2022-09-17 MED ORDER — DOCUSATE SODIUM 50 MG/5ML PO LIQD
100.0000 mg | Freq: Two times a day (BID) | ORAL | Status: DC
  Administered 2022-09-17 – 2022-09-19 (×5): 100 mg via ORAL
  Filled 2022-09-17 (×6): qty 10

## 2022-09-17 NOTE — Progress Notes (Signed)
Pt refused routine lab work.

## 2022-09-17 NOTE — Evaluation (Signed)
Occupational Therapy Evaluation Patient Details Name: Vincent Cummings MRN: [id number] DOB: Jan 10, 1946 Today's Date: 09/17/2022   History of Present Illness Pt is a 76 y/o male who presents from SNF for AMS and decreased responsiveness. In ED noted UA with mod Hgb and rare bacteria. Foley was placed with 4.8 L urine output. PMH significant for HTN, CVA, gout, COVID, acute respiratory failure with hypoxia, dementia.   Clinical Impression   Per chart review, pt was at SNF and unsure of PLOF. Pt currently requiring Total A for ADLs and bed mobility. Pt with limited participation due to pain tolerance; stating very loudly "don't move me" with any movement by therapist.  Repositioning pt into upright position in bed and setting up for self feeding of breakfast. Pt requiring Total A for eating breakfast; motivated to drink out of straw and bringing cup to mouth with Max cues and Min A for initiation. Pt would benefit from further acute OT to facilitate safe dc. Recommend dc to SNF for further OT to optimize safety, independence with ADLs, and return to PLOF.      Recommendations for follow up therapy are one component of a multi-disciplinary discharge planning process, led by the attending physician.  Recommendations may be updated based on patient status, additional functional criteria and insurance authorization.   Follow Up Recommendations  Skilled nursing-short term rehab (<3 hours/day)    Assistance Recommended at Discharge Frequent or constant Supervision/Assistance  Patient can return home with the following  (Total A)    Functional Status Assessment  Patient has had a recent decline in their functional status and demonstrates the ability to make significant improvements in function in a reasonable and predictable amount of time.  Equipment Recommendations  Other (comment) (Defer to next venue)    Recommendations for Other Services       Precautions / Restrictions  Precautions Precautions: Fall Restrictions Weight Bearing Restrictions: No      Mobility Bed Mobility Overal bed mobility: Needs Assistance             General bed mobility comments: Repositioned in bed with Total A +2. Pt louding saying "dont touch me!" with each movement. Progressing him into upright posture to eating breakfast.    Transfers                   General transfer comment: Defer      Balance                                           ADL either performed or assessed with clinical judgement   ADL Overall ADL's : Needs assistance/impaired Eating/Feeding: Total assistance;Bed level Eating/Feeding Details (indicate cue type and reason): Repositioning pt into upright position in bed. Pt with decreased following of commands and poor participation to self feed. Pt motiviated to drink coffee and was able to bring cup with straw to mouth after Max cues and Min hand over hand for initation.                 Lower Body Dressing: Total assistance Lower Body Dressing Details (indicate cue type and reason): Donning one sock and pt yelling "dont touch me"               General ADL Comments: Total A for ADLs due to cognition and poor pain tolerance     Vision  Perception     Praxis      Pertinent Vitals/Pain       Hand Dominance     Extremity/Trunk Assessment Upper Extremity Assessment Upper Extremity Assessment: Defer to OT evaluation   Lower Extremity Assessment Lower Extremity Assessment: Generalized weakness (LE's extremely painful to light touch, appear edematous.)   Cervical / Trunk Assessment Cervical / Trunk Assessment:  (Unable to assess)   Communication Communication Communication: No difficulties   Cognition Arousal/Alertness: Awake/alert Behavior During Therapy: Flat affect Overall Cognitive Status: No family/caregiver present to determine baseline cognitive functioning                                  General Comments: Per RN, pt with dementia at baseline. Pt with decreased following commands and occupational performance/participation. Unsure of baseline cognition     General Comments  Turning off television and turning on low volume music to optimzie sensory input    Exercises     Shoulder Instructions      Home Living Family/patient expects to be discharged to:: Skilled nursing facility                                        Prior Functioning/Environment Prior Level of Function : Patient poor historian/Family not available             Mobility Comments: Pt reports he typically uses a wheelchair which is consistent with information in chart review. Appears pt transfers to his wheelchair with an unknown amount of assistance and is able to mobilize himself around the facility in the chair. Will need confirmation of this. ADLs Comments: Unknown, but likely requiring assist from staff.        OT Problem List: Decreased strength;Decreased range of motion;Decreased activity tolerance;Impaired balance (sitting and/or standing);Decreased coordination;Decreased cognition;Decreased safety awareness;Decreased knowledge of use of DME or AE;Decreased knowledge of precautions;Impaired UE functional use;Pain      OT Treatment/Interventions: Self-care/ADL training;Therapeutic exercise;Energy conservation;DME and/or AE instruction;Therapeutic activities;Patient/family education;Balance training    OT Goals(Current goals can be found in the care plan section) Acute Rehab OT Goals Patient Stated Goal: Unstated OT Goal Formulation: Patient unable to participate in goal setting Time For Goal Achievement: 10/01/22 Potential to Achieve Goals: Good  OT Frequency: Min 2X/week    Co-evaluation PT/OT/SLP Co-Evaluation/Treatment: Yes Reason for Co-Treatment: For patient/therapist safety;To address functional/ADL transfers   OT goals addressed during  session: ADL's and self-care      AM-PAC OT "6 Clicks" Daily Activity     Outcome Measure Help from another person eating meals?: Total Help from another person taking care of personal grooming?: Total Help from another person toileting, which includes using toliet, bedpan, or urinal?: Total Help from another person bathing (including washing, rinsing, drying)?: Total Help from another person to put on and taking off regular upper body clothing?: Total Help from another person to put on and taking off regular lower body clothing?: Total 6 Click Score: 6   End of Session Nurse Communication: Mobility status;Other (comment) (will need assistance for eating)  Activity Tolerance: Patient limited by pain Patient left: in bed;with call bell/phone within reach;with bed alarm set  OT Visit Diagnosis: Unsteadiness on feet (R26.81);Other abnormalities of gait and mobility (R26.89);Muscle weakness (generalized) (M62.81);Pain  Time: 8032-1224 OT Time Calculation (min): 16 min Charges:  OT General Charges $OT Visit: 1 Visit OT Evaluation $OT Eval Moderate Complexity: 1 Mod  Maleigh Bagot MSOT, OTR/L Acute Rehab Office: [phone]  Theodoro Grist Zuzu Befort 09/17/2022, 1:26 PM

## 2022-09-17 NOTE — Progress Notes (Addendum)
[facility] PROGRESS NOTE    Progress Note  Vincent Cummings  IZT:[id number] DOB: 1946/07/07 DOA: 09/15/2022 PCP: Marinda Elk, MD     Brief Narrative:   Vincent Cummings is an 76 y.o. male past medical history significant for essential hypertension dementia (per son) chronic respiratory failure with hypoxia on oxygen at home due to COVID-19, chronic kidney disease stage IIIa, he was brought as per staff report they noticed him that he was laying around more than normal when he is usually ambulatory, the staff reports an episode having dark tarry stools and swelling of his left elbow, in the ED he remained relatively stable satting greater than 90% on room air, mild hyperkalemia 5.2 leukocytosis of 16 hemoglobin of 16, platelet count of 490, with a BUN of 182, and a creatinine of 14 (with a baseline creatinine of around 1) CT scan of the abdomen pelvis showed bilateral nephrolithiasis, bilateral hydronephrosis but no stones, CT of the head showed chronic microvascular ischemic changes    Assessment/Plan:   Acute metabolic encephalopathy: Status post 4 L of fluid resuscitation. CT of the head showed age associated atrophy. UDS negative. BUN of almost 200 and a creatinine of 13 on admission. Ammonia, salicylate and Tylenol level were unremarkable  Acute renal failure superimposed on stage 3a chronic kidney disease (HCC) Foley was placed in the ED 2 L came out. He was started on a bicarb drip started on aggressive fluid resuscitation. Nephrology was consulted recommended no indication for dialysis disease poor dialysis candidate short-term and long-term. Discontinue ACE inhibitor. Continue aggressive IV fluid hydration. Recheck a basic metabolic panel in the morning. Patient is refusing labs this morning. Blood pressure is borderline discontinue antihypertensive medication.  Hyperkalemia He was given Lokelma and bicarb drip potassium is improved.  Possible BPH: Foley  was placed 2 L of fluid came out. Start Flomax  SIRS/lactic acidosis: White count of 16 on admission, UA showed no signs of infection. Culture data has been sent. Restart empirically on Vanco and cefepime, discontinue Flagyl. Lactic acidosis has resolved. Leukocytosis yesterday in the evening was 12.  Positive FOBT: Admission his hemoglobin was 16 after fluid resuscitation is now 14. There are no signs of overt bleeding. CBC is pending this morning holding aspirin.  Essential hypertension: Hold ACE inhibitor Norvasc and clonidine. Continue metoprolol blood pressures relatively well controlled.  Elevated AST: Denies any abdominal pain we will continue to monitor.  Thrombocytosis: Likely reactive.  Bilateral nephrolithiasis: No signs of obstruction noted.  Dementia with behavioral disturbances: Continue Depakote and Seroquel. PT OT evaluation.  Sacral cubitus ulcer stage I RN Pressure Injury Documentation: Pressure Injury 02/27/20 Coccyx Stage 1 -  Intact skin with non-blanchable redness of a localized area usually over a bony prominence. (Active)  02/27/20 0500  Location: Coccyx  Location Orientation:   Staging: Stage 1 -  Intact skin with non-blanchable redness of a localized area usually over a bony prominence.  Wound Description (Comments):   Present on Admission: Yes    Estimated body mass index is 22.93 kg/m as calculated from the following:   Height as of this encounter: 5\' 8"  (1.727 m).   Weight as of this encounter: 68.4 kg.     DVT prophylaxis: lovenox Family Communication:none Status is: Inpatient Remains inpatient appropriate because: Acute metabolic encephalopathy no acute kidney injury    Code Status:     Code Status Orders  (From admission, onward)           Start  Ordered   09/16/22 0745  Do not attempt resuscitation (DNR)  Continuous       Question Answer Comment  In the event of cardiac or respiratory ARREST Do not call a "code  blue"   In the event of cardiac or respiratory ARREST Do not perform Intubation, CPR, defibrillation or ACLS   In the event of cardiac or respiratory ARREST Use medication by any route, position, wound care, and other measures to relive pain and suffering. May use oxygen, suction and manual treatment of airway obstruction as needed for comfort.      09/16/22 0753           Code Status History     Date Active Date Inactive Code Status Order ID Comments User Context   09/16/2022 0503 09/16/2022 0753 DNR [id number]  Angie FavaHowerter, Justin B, DO ED   09/15/2022 2224 09/16/2022 0503 DNR [id number]  Roxy HorsemanBrowning, [name], PA-C ED   03/02/2022 1535 09/15/2022 2204 DNR [id number]  Gillis SantaMedina-Vargas, Monina C, NP Outpatient   02/27/2020 0311 03/04/2020 1934 Full Code [id number]  Shalhoub, Deno LungerGeorge J, MD ED         IV Access:   Peripheral IV   Procedures and diagnostic studies:   CT ABDOMEN PELVIS WO CONTRAST  Result Date: 09/16/2022 CLINICAL DATA:  Abdominal pain, acute, nonlocalized shortness of breath, abdominal pain EXAM: CT ABDOMEN AND PELVIS WITHOUT CONTRAST TECHNIQUE: Multidetector CT imaging of the abdomen and pelvis was performed following the standard protocol without IV contrast. RADIATION DOSE REDUCTION: This exam was performed according to the departmental dose-optimization program which includes automated exposure control, adjustment of the mA and/or kV according to patient size and/or use of iterative reconstruction technique. COMPARISON:  None Available. FINDINGS: Lower chest: Coronary artery and aortic calcifications. No acute abnormality Hepatobiliary: No focal hepatic abnormality. Gallbladder unremarkable. Pancreas: No focal abnormality or ductal dilatation. Spleen: No focal abnormality.  Normal size. Adrenals/Urinary Tract: Bilateral nonobstructing renal stones. Bilateral hydronephrosis. No ureteral stones seen. Urinary bladder is distended above the level of the umbilicus. Foley catheter in place.  Adrenal glands unremarkable. Stomach/Bowel: Normal appendix Stomach, large and small bowel grossly unremarkable. Vascular/Lymphatic: Aortic atherosclerosis. No evidence of aneurysm or adenopathy. Reproductive: Prostate enlargement Other: No free fluid or free air. Musculoskeletal: No acute bony abnormality. IMPRESSION: Bilateral nephrolithiasis. Bilateral hydronephrosis without ureteral stones. Urinary bladder is distended above the level of the umbilicus. Foley catheter in place. Prostate enlargement. Aortic atherosclerosis.  Coronary artery disease. Electronically Signed   By: Charlett NoseKevin  Dover M.D.   On: 09/16/2022 03:50   CT HEAD WO CONTRAST (5MM)  Result Date: 09/16/2022 CLINICAL DATA:  Delirium EXAM: CT HEAD WITHOUT CONTRAST TECHNIQUE: Contiguous axial images were obtained from the base of the skull through the vertex without intravenous contrast. RADIATION DOSE REDUCTION: This exam was performed according to the departmental dose-optimization program which includes automated exposure control, adjustment of the mA and/or kV according to patient size and/or use of iterative reconstruction technique. COMPARISON:  02/27/2020 FINDINGS: Brain: There is atrophy and chronic small vessel disease changes. No acute intracranial abnormality. Specifically, no hemorrhage, hydrocephalus, mass lesion, acute infarction, or significant intracranial injury. Vascular: No hyperdense vessel or unexpected calcification. Skull: No acute calvarial abnormality. Sinuses/Orbits: No acute findings Other: None IMPRESSION: Atrophy, chronic microvascular disease. No acute intracranial abnormality. Electronically Signed   By: Charlett NoseKevin  Dover M.D.   On: 09/16/2022 03:48   DG Chest Port 1 View  Result Date: 09/15/2022 CLINICAL DATA:  Questionable sepsis. Evaluate for infiltrate. Left elbow pain. EXAM: PORTABLE  CHEST 1 VIEW LEFT ELBOW SERIES, 4 VIEWS COMPARISON:  Portable chest 02/26/2020. No prior left elbow series for comparison. FINDINGS:  Chest: There are multiple overlying monitor wires mostly on the left. There is mild cardiomegaly with CHF pattern. No pleural effusion is seen. The lungs are clear. The mediastinum is stable with aortic calcification and mild tortuosity. There are moderate degenerative changes of the shoulders, mild degenerative change from dextroscoliosis thoracic spine. Bilateral basal patchy infiltrates noted previously have resolved in the interval. Left elbow four views: There is a sizable lucent subcutaneous soft tissue mass in the dorsolateral distal upper arm at the level of the distal humerus. The mass is estimated to measure at least 8.0 x 2.3 x 1.1 cm in craniocaudal, transverse and AP axis. 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 MRI is recommended without and with contrast to assess for complicating features. There is no evidence of fracture, dislocation or joint effusion. Spurring is seen of the proximal radioulnar joint and a trochleoulnar joint. There is a curvilinear ossific body along side the lateral aspect of the radial head which is probably a dystrophic calcification due to old trauma or could be a loose body in the joint space. Prominent enthesopathic spurring arises from the lateral humeral epicondyle, with trace medial epicondylar spurring. There is normal bone mineralization. IMPRESSION: 1. No acute radiographic chest findings.  Mild cardiomegaly. 2. Aortic atherosclerosis. 3. 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 MRI is recommended without and with contrast to assess for complicating features. 4. Degenerative left elbow changes without evidence of acute fracture or dislocation. 5. Dystrophic calcification versus loose body along side the lateral aspect of the radial head. 6. Lateral greater than medial  epicondylar enthesopathy. Electronically Signed   By: Almira Bar M.D.   On: 09/15/2022 22:52   DG Elbow Complete Left  Result Date: 09/15/2022 CLINICAL DATA:  Questionable sepsis. Evaluate for infiltrate. Left elbow pain. EXAM: PORTABLE CHEST 1 VIEW LEFT ELBOW SERIES, 4 VIEWS COMPARISON:  Portable chest 02/26/2020. No prior left elbow series for comparison. FINDINGS: Chest: There are multiple overlying monitor wires mostly on the left. There is mild cardiomegaly with CHF pattern. No pleural effusion is seen. The lungs are clear. The mediastinum is stable with aortic calcification and mild tortuosity. There are moderate degenerative changes of the shoulders, mild degenerative change from dextroscoliosis thoracic spine. Bilateral basal patchy infiltrates noted previously have resolved in the interval. Left elbow four views: There is a sizable lucent subcutaneous soft tissue mass in the dorsolateral distal upper arm at the level of the distal humerus. The mass is estimated to measure at least 8.0 x 2.3 x 1.1 cm in craniocaudal, transverse and AP axis. 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 MRI is recommended without and with contrast to assess for complicating features. There is no evidence of fracture, dislocation or joint effusion. Spurring is seen of the proximal radioulnar joint and a trochleoulnar joint. There is a curvilinear ossific body along side the lateral aspect of the radial head which is probably a dystrophic calcification due to old trauma or could be a loose body in the joint space. Prominent enthesopathic spurring arises from the lateral humeral epicondyle, with trace medial epicondylar spurring. There is normal bone mineralization. IMPRESSION: 1. No acute radiographic  chest findings.  Mild cardiomegaly. 2. Aortic atherosclerosis. 3. Lucent subcutaneous soft tissue mass in the dorsolateral distal upper arm, most likely a lipoma  and is probably benign, but it is not well characterized radiographically. MRI is recommended without and with contrast to assess for complicating features. 4. Degenerative left elbow changes without evidence of acute fracture or dislocation. 5. Dystrophic calcification versus loose body along side the lateral aspect of the radial head. 6. Lateral greater than medial epicondylar enthesopathy. Electronically Signed   By: Telford Nab M.D.   On: 09/15/2022 22:52   DG Pelvis Portable  Result Date: 09/15/2022 CLINICAL DATA:  Questionable sepsis - evaluate for abnormality EXAM: PORTABLE PELVIS 1-2 VIEWS COMPARISON:  None Available. FINDINGS: Limited evaluation due to overlapping osseous structures and overlying soft tissues. Question cortical irregularity of the L5-S1 level. Degenerative changes visualized lower lumbar spine. No acute displaced fracture dislocation of the hips on frontal view. No aggressive appearing pelvic bone lesions are seen. IMPRESSION: Limited evaluation due to overlapping osseous structures and overlying soft tissues. Question cortical irregularity of the L5-S1 level. Consider CT pelvis for further evaluation. Electronically Signed   By: Iven Finn M.D.   On: 09/15/2022 22:41     Medical Consultants:   None.   Subjective:    Vincent Cummings has no complaints.  Objective:    Vitals:   09/16/22 2314 09/17/22 0500 09/17/22 0534 09/17/22 0731  BP: 109/74  107/78 110/74  Pulse: (!) 102   92  Resp:    16  Temp:   98.3 F (36.8 C) (!) 97.3 F (36.3 C)  TempSrc:   Oral Oral  SpO2:      Weight:  68.4 kg    Height:       SpO2: 97 %   Intake/Output Summary (Last 24 hours) at 09/17/2022 0732 Last data filed at 09/17/2022 0865 Gross per 24 hour  Intake 2720.68 ml  Output 6500 ml  Net -3779.32 ml   Filed Weights   09/15/22 2304 09/17/22 0500  Weight: 79.4 kg 68.4 kg    Exam: General exam: In no acute distress, disheveled Respiratory system: Good air  movement and clear to auscultation. Cardiovascular system: S1 & S2 heard, RRR. No JVD. Gastrointestinal system: Abdomen is nondistended, soft and nontender.  Extremities: No pedal edema. Skin: No rashes, lesions or ulcers  Data Reviewed:    Labs: Basic Metabolic Panel: Recent Labs  Lab 09/16/22 0035 09/16/22 0324 09/16/22 0700 09/16/22 0748  NA 135  --   --  144  K 7.2* 5.2*  --  5.2*  CL 100  --   --  110  CO2 14*  --   --  22  GLUCOSE 107* 176*  --  169*  BUN 182*  --   --  120*  CREATININE 13.67*  --   --  7.18*  CALCIUM 8.7*  --   --  8.5*  MG  --   --  2.2  --   PHOS  --   --  5.1* 5.2*   GFR Estimated Creatinine Clearance: 8.6 mL/min (A) (by C-G formula based on SCr of 7.18 mg/dL (H)). Liver Function Tests: Recent Labs  Lab 09/16/22 0035 09/16/22 0748  AST 34  --   ALT 73*  --   ALKPHOS 88  --   BILITOT 0.7  --   PROT 6.7  --   ALBUMIN 2.3* 2.0*   No results for input(s): "LIPASE", "AMYLASE" in the last 168 hours. Recent  Labs  Lab 09/16/22 0240  AMMONIA 33   Coagulation profile Recent Labs  Lab 09/16/22 0015  INR 1.4*   COVID-19 Labs  No results for input(s): "DDIMER", "FERRITIN", "LDH", "CRP" in the last 72 hours.  Lab Results  Component Value Date   SARSCOV2NAA NEGATIVE 09/15/2022    CBC: Recent Labs  Lab 09/15/22 2315 09/16/22 0700 09/16/22 1602  WBC 16.9* 12.1*  --   NEUTROABS 14.6* 10.2*  --   HGB 15.9 13.9 13.7  HCT 45.9 39.4 40.3  MCV 91.1 90.4  --   PLT 490* 404*  --    Cardiac Enzymes: No results for input(s): "CKTOTAL", "CKMB", "CKMBINDEX", "TROPONINI" in the last 168 hours. BNP (last 3 results) No results for input(s): "PROBNP" in the last 8760 hours. CBG: Recent Labs  Lab 09/16/22 0224 09/16/22 0300 09/16/22 0359 09/16/22 0500 09/16/22 0829  GLUCAP 95 170* 104* 123* 101*   D-Dimer: No results for input(s): "DDIMER" in the last 72 hours. Hgb A1c: No results for input(s): "HGBA1C" in the last 72 hours. Lipid  Profile: No results for input(s): "CHOL", "HDL", "LDLCALC", "TRIG", "CHOLHDL", "LDLDIRECT" in the last 72 hours. Thyroid function studies: No results for input(s): "TSH", "T4TOTAL", "T3FREE", "THYROIDAB" in the last 72 hours.  Invalid input(s): "FREET3" Anemia work up: No results for input(s): "VITAMINB12", "FOLATE", "FERRITIN", "TIBC", "IRON", "RETICCTPCT" in the last 72 hours. Sepsis Labs: Recent Labs  Lab 09/15/22 2222 09/15/22 2315 09/16/22 0022 09/16/22 0700 09/16/22 0815  WBC  --  16.9*  --  12.1*  --   LATICACIDVEN 2.3*  --  2.2*  --  1.5   Microbiology Recent Results (from the past 240 hour(s))  Urine Culture     Status: None   Collection Time: 09/15/22 10:22 PM   Specimen: In/Out Cath Urine  Result Value Ref Range Status   Specimen Description IN/OUT CATH URINE  Final   Special Requests NONE  Final   Culture   Final    NO GROWTH Performed at [facility], [address], [place], [place] [address]    Report Status 09/16/2022 FINAL  Final  Blood Culture (routine x 2)     Status: None (Preliminary result)   Collection Time: 09/15/22 11:10 PM   Specimen: BLOOD LEFT FOREARM  Result Value Ref Range Status   Specimen Description BLOOD LEFT FOREARM  Final   Special Requests   Final    BOTTLES DRAWN AEROBIC AND ANAEROBIC Blood Culture results may not be optimal due to an inadequate volume of blood received in culture bottles   Culture   Final    NO GROWTH < 12 HOURS Performed at [facility], [address] [place], [place] [address]    Report Status PENDING  Incomplete  Blood Culture (routine x 2)     Status: None (Preliminary result)   Collection Time: 09/15/22 11:15 PM   Specimen: BLOOD RIGHT FOREARM  Result Value Ref Range Status   Specimen Description BLOOD RIGHT FOREARM  Final   Special Requests   Final    BOTTLES DRAWN AEROBIC AND ANAEROBIC Blood Culture results may not be optimal due to an inadequate volume of blood received in culture bottles    Culture   Final    NO GROWTH < 12 HOURS Performed at [facility], [address] [place], [place] [address]    Report Status PENDING  Incomplete  Resp Panel by RT-PCR (Flu A&B, Covid) Anterior Nasal Swab  Status: None   Collection Time: 09/15/22 11:20 PM   Specimen: Anterior Nasal Swab  Result Value Ref Range Status   SARS Coronavirus 2 by RT PCR NEGATIVE NEGATIVE Final    Comment: (NOTE) SARS-CoV-2 target nucleic acids are NOT DETECTED.  The SARS-CoV-2 RNA is generally detectable in upper respiratory specimens during the acute phase of infection. The lowest concentration of SARS-CoV-2 viral copies this assay can detect is 138 copies/mL. A negative result does not preclude SARS-Cov-2 infection and should not be used as the sole basis for treatment or other patient management decisions. A negative result may occur with  improper specimen collection/handling, submission of specimen other than nasopharyngeal swab, presence of viral mutation(s) within the areas targeted by this assay, and inadequate number of viral copies(<138 copies/mL). A negative result must be combined with clinical observations, patient history, and epidemiological information. The expected result is Negative.  Fact Sheet for Patients:  BloggerCourse.com  Fact Sheet for Healthcare Providers:  SeriousBroker.it  This test is no t yet approved or cleared by the [place] FDA and  has been authorized for detection and/or diagnosis of SARS-CoV-2 by FDA under an Emergency Use Authorization (EUA). This EUA will remain  in effect (meaning this test can be used) for the duration of the COVID-19 declaration under Section 564(b)(1) of the Act, 21 U.S.C.section 360bbb-3(b)(1), unless the authorization is terminated  or revoked sooner.       Influenza A by PCR NEGATIVE NEGATIVE Final   Influenza B by PCR NEGATIVE NEGATIVE Final    Comment: (NOTE) The  Xpert Xpress SARS-CoV-2/FLU/RSV plus assay is intended as an aid in the diagnosis of influenza from Nasopharyngeal swab specimens and should not be used as a sole basis for treatment. Nasal washings and aspirates are unacceptable for Xpert Xpress SARS-CoV-2/FLU/RSV testing.  Fact Sheet for Patients: BloggerCourse.com  Fact Sheet for Healthcare Providers: SeriousBroker.it  This test is not yet approved or cleared by the [place] FDA and has been authorized for detection and/or diagnosis of SARS-CoV-2 by FDA under an Emergency Use Authorization (EUA). This EUA will remain in effect (meaning this test can be used) for the duration of the COVID-19 declaration under Section 564(b)(1) of the Act, 21 U.S.C. section 360bbb-3(b)(1), unless the authorization is terminated or revoked.  Performed at [facility], [address] [place], [place] [address]      Medications:    amLODipine  5 mg Oral Daily   Chlorhexidine Gluconate Cloth  6 each Topical Daily   cloNIDine  0.1 mg Transdermal Weekly   divalproex  125 mg Oral BID   docusate sodium  100 mg Oral Daily   metoprolol tartrate  25 mg Oral BID   pantoprazole (PROTONIX) IV  40 mg Intravenous Q24H   QUEtiapine  12.5 mg Oral BID   sodium chloride flush  3 mL Intravenous Q12H   sodium zirconium cyclosilicate  10 g Oral Once   tamsulosin  0.4 mg Oral Daily   vancomycin variable dose per unstable renal function (pharmacist dosing)   Does not apply See admin instructions   Continuous Infusions:  ceFEPime (MAXIPIME) IV Stopped (09/16/22 2343)   lactated ringers 125 mL/hr at 09/17/22 0632      LOS: 1 day   Marinda Elk  [facility]  09/17/2022, 7:32 AM

## 2022-09-17 NOTE — Evaluation (Signed)
Physical Therapy Evaluation Patient Details Name: Vincent Cummings MRN: [id number] DOB: January 09, 1946 Today's Date: 09/17/2022  History of Present Illness  Pt is a 76 y/o male who presents from SNF for AMS and decreased responsiveness. In ED noted UA with mod Hgb and rare bacteria. Foley was placed with 4.8 L urine output. PMH significant for HTN, CVA, gout, COVID, acute respiratory failure with hypoxia, dementia.   Clinical Impression  Pt admitted with above diagnosis. Pt currently with functional limitations due to the deficits listed below (see PT Problem List). At the time of PT eval pt was limited by pain, unable to tolerate transition to EOB and pt asking therapists to leave him alone, not move him, etc. Focus of session was repositioning in bed to sit up and eat breakfast. Per chart review, pt was able to mobilize in his wheelchair around the skilled nursing facility so he is currently demonstrating a significant decline in function. Pt will benefit from short term rehab at the SNF level to return to PLOF. Acutely, pt will benefit from skilled PT to increase their independence and safety with mobility to allow discharge to the venue listed below.          Recommendations for follow up therapy are one component of a multi-disciplinary discharge planning process, led by the attending physician.  Recommendations may be updated based on patient status, additional functional criteria and insurance authorization.  Follow Up Recommendations Skilled nursing-short term rehab (<3 hours/day) Can patient physically be transported by private vehicle: No    Assistance Recommended at Discharge Frequent or constant Supervision/Assistance  Patient can return home with the following  Two people to help with walking and/or transfers;Two people to help with bathing/dressing/bathroom;Assistance with cooking/housework;Assistance with feeding;Direct supervision/assist for medications management;Direct  supervision/assist for financial management;Assist for transportation;Help with stairs or ramp for entrance    Equipment Recommendations None recommended by PT  Recommendations for Other Services       Functional Status Assessment Patient has had a recent decline in their functional status and demonstrates the ability to make significant improvements in function in a reasonable and predictable amount of time.     Precautions / Restrictions Precautions Precautions: Fall Restrictions Weight Bearing Restrictions: No      Mobility  Bed Mobility               General bed mobility comments: Attempted to initiate bed mobility, however LE's extremely painful to light touch and we were unable. +2 total assist required for repositioning in the bed, including scooting towards HOB.    Transfers                   General transfer comment: Unable to progress to OOB mobility at this time.    Ambulation/Gait               General Gait Details: It appears that pt is nonambulatory at baseline.  Stairs            Wheelchair Mobility    Modified Rankin (Stroke Patients Only)       Balance                                             Pertinent Vitals/Pain Pain Assessment Pain Assessment: Faces Faces Pain Scale: Hurts worst Pain Location: Feet, Legs, generalized with movement Pain Descriptors / Indicators: Grimacing, Guarding, Moaning  Pain Intervention(s): Limited activity within patient's tolerance, Monitored during session, Repositioned, Patient requesting pain meds-RN notified    Home Living Family/patient expects to be discharged to:: Skilled nursing facility                        Prior Function Prior Level of Function : Patient poor historian/Family not available             Mobility Comments: Pt reports he typically uses a wheelchair which is consistent with information in chart review. Appears pt transfers to his  wheelchair with an unknown amount of assistance and is able to mobilize himself around the facility in the chair. Will need confirmation of this. ADLs Comments: Unknown, but likely requiring assist from staff.     Hand Dominance        Extremity/Trunk Assessment   Upper Extremity Assessment Upper Extremity Assessment: Defer to OT evaluation    Lower Extremity Assessment Lower Extremity Assessment: Generalized weakness (LE's extremely painful to light touch, appear edematous.)    Cervical / Trunk Assessment Cervical / Trunk Assessment:  (Unable to assess)  Communication   Communication: No difficulties  Cognition Arousal/Alertness: Awake/alert (Lethargic initially, then was able to rouse and remain alert) Behavior During Therapy: Anxious Overall Cognitive Status: History of cognitive impairments - at baseline                                 General Comments: Dementia reported in chart. Pt easily distracted from pain throughout session.        General Comments General comments (skin integrity, edema, etc.): Turning off television and turning on low volume music to optimzie sensory input    Exercises     Assessment/Plan    PT Assessment Patient needs continued PT services  PT Problem List Decreased strength;Decreased range of motion;Decreased activity tolerance;Decreased balance;Decreased mobility;Decreased cognition;Decreased knowledge of use of DME;Decreased safety awareness;Decreased knowledge of precautions;Pain       PT Treatment Interventions DME instruction;Functional mobility training;Therapeutic activities;Therapeutic exercise;Balance training;Neuromuscular re-education;Cognitive remediation;Patient/family education;Wheelchair mobility training    PT Goals (Current goals can be found in the Care Plan section)  Acute Rehab PT Goals Patient Stated Goal: To be left alone PT Goal Formulation: Patient unable to participate in goal setting Time For Goal  Achievement: 10/01/22 Potential to Achieve Goals: Fair Additional Goals Additional Goal #1: Pt will demonstrate >120' wheelchair mobility with supervision for safety.    Frequency Min 2X/week     Co-evaluation   Reason for Co-Treatment: For patient/therapist safety;To address functional/ADL transfers   OT goals addressed during session: ADL's and self-care       AM-PAC PT "6 Clicks" Mobility  Outcome Measure Help needed turning from your back to your side while in a flat bed without using bedrails?: Total Help needed moving from lying on your back to sitting on the side of a flat bed without using bedrails?: Total Help needed moving to and from a bed to a chair (including a wheelchair)?: Total Help needed standing up from a chair using your arms (e.g., wheelchair or bedside chair)?: Total Help needed to walk in hospital room?: Total Help needed climbing 3-5 steps with a railing? : Total 6 Click Score: 6    End of Session   Activity Tolerance: Patient limited by pain Patient left: in bed;with call bell/phone within reach;with bed alarm set;with nursing/sitter in room Nurse Communication: Mobility status;Need for  lift equipment PT Visit Diagnosis: Pain;Other abnormalities of gait and mobility (R26.89) Pain - Right/Left:  (bilateral) Pain - part of body: Ankle and joints of foot;Leg    Time: 7169-6789 PT Time Calculation (min) (ACUTE ONLY): 20 min   Charges:   PT Evaluation $PT Eval Moderate Complexity: 1 Mod          Rolinda Roan, PT, DPT Acute Rehabilitation Services Secure Chat Preferred Office: [phone]   Thelma Comp 09/17/2022, 1:28 PM

## 2022-09-17 NOTE — Progress Notes (Signed)
Pharmacy Antibiotic Note  Vincent Cummings is a 76 y.o. male for which pharmacy has been consulted for cefepime and vancomycin dosing for sepsis. Patient presenting with AMS from SNF.  WBC trending down to 12.1 Lactic acid trending down to 1.5 Temperature increasing from 95.7 to 97.9 F SCr trending down from 13.67 to 1.7  Plan: Change Cefepime 2g IV q12h Change vanco to 1250 mg iv q36h   Monitor daily CBC, temp, SCr, and for clinical signs of improvement  F/u cultures and de-escalate antibiotics as able   Height: 5\' 8"  (172.7 cm) Weight: 68.4 kg (150 lb 12.7 oz) IBW/kg (Calculated) : 68.4  Temp (24hrs), Avg:98.1 F (36.7 C), Min:97.3 F (36.3 C), Max:98.7 F (37.1 C)  Recent Labs  Lab 09/15/22 2222 09/15/22 2315 09/16/22 0022 09/16/22 0035 09/16/22 0700 09/16/22 0748 09/16/22 0815 09/17/22 0951  WBC  --  16.9*  --   --  12.1*  --   --  11.4*  CREATININE  --   --   --  13.67*  --  7.18*  --  1.70*  LATICACIDVEN 2.3*  --  2.2*  --   --   --  1.5  --   VANCORANDOM  --   --   --   --   --   --   --  5     Estimated Creatinine Clearance: 36.3 mL/min (A) (by C-G formula based on SCr of 1.7 mg/dL (H)).    Allergies  Allergen Reactions   Atorvastatin Itching and Rash    Antimicrobials this admission: vancomycin 11/1 >>  cefepime 11/1 >>  flagyl 11/1 x1  Microbiology results: 11/1 Ur Cx: ngtdF 11/1 Bld Cx x2: ngtd1  Thank you for allowing pharmacy to be a part of this patient's care.  Vaughan Basta BS, PharmD, BCPS Clinical Pharmacist 09/17/2022 1:12 PM  Contact: [phone] after 3 PM  "Be curious, not judgmental..." -Jamal Maes

## 2022-09-17 NOTE — Progress Notes (Signed)
[facility] Progress Note   Assessment/ Plan:    AKI: secondary to obstructive uropathy, hydro on imaging             - Foley placed, already great UOP with improving Cr/ BUN and K             - LA is 1.5, can bolus isotonic fluids if needed             - no indication for dialysis and pt is a very poor candidate for short or long- term dialysis  - start 1/2 NS today for mild hypernatremia  - nothing else to add- will sign off.  Call with questions     2.  Hyperkalemia:             - improving with aggressive K lowering measures and with relief of obstruction  Refused lokelma yesterday, kayexelate ordered for today   3.  Sepsis:             - likely urinary source             - vanc/ cefepime   4.  Chronic constipation             - MOM and Fleets enemas on home list- please do not give these in setting of AKI             - can use miralax, dulcolax, miralax, and lactulose   5. Dispo: admitted  Subjective:    Cr down to 1.7, Na up a little.     Objective:   BP 110/74   Pulse 92   Temp (!) 97.3 F (36.3 C) (Oral)   Resp 16   Ht 5\' 8"  (1.727 m)   Wt 68.4 kg   SpO2 97%   BMI 22.93 kg/m   Physical Exam: GEN: NAD, appears chronically ill HEENT very dry MM, a little improved NECK no JVD PULM clear CV RRR ABD soft EXT sarcopenic, 1+ LE edema NEURO + dysrthria, sleepy, arousable SKIN dry with flaky peeling skin on legs  Labs: BMET Recent Labs  Lab 09/16/22 0035 09/16/22 0324 09/16/22 0700 09/16/22 0748 09/17/22 0951  NA 135  --   --  144 146*  K 7.2* 5.2*  --  5.2* 5.3*  CL 100  --   --  110 117*  CO2 14*  --   --  22 20*  GLUCOSE 107* 176*  --  169* 98  BUN 182*  --   --  120* 30*  CREATININE 13.67*  --   --  7.18* 1.70*  CALCIUM 8.7*  --   --  8.5* 8.8*  PHOS  --   --  5.1* 5.2* 2.3*   CBC Recent Labs  Lab 09/15/22 2315 09/16/22 0700 09/16/22 1602 09/17/22 0951  WBC 16.9* 12.1*  --  11.4*  NEUTROABS 14.6* 10.2*  --   --   HGB  15.9 13.9 13.7 13.8  HCT 45.9 39.4 40.3 42.3  MCV 91.1 90.4  --  95.7  PLT 490* 404*  --  396      Medications:     Chlorhexidine Gluconate Cloth  6 each Topical Daily   divalproex  125 mg Oral BID   docusate sodium  100 mg Oral Daily   metoprolol tartrate  25 mg Oral BID   pantoprazole (PROTONIX) IV  40 mg Intravenous Q24H   QUEtiapine  12.5 mg Oral BID   sodium chloride flush  3 mL  Intravenous Q12H   sodium polystyrene  30 g Oral Once   sodium zirconium cyclosilicate  10 g Oral Once   tamsulosin  0.4 mg Oral Daily     Bufford Buttner, MD 09/17/2022, 1:39 PM

## 2022-09-18 DIAGNOSIS — G9341 Metabolic encephalopathy: Secondary | ICD-10-CM | POA: Diagnosis not present

## 2022-09-18 DIAGNOSIS — E875 Hyperkalemia: Secondary | ICD-10-CM | POA: Diagnosis not present

## 2022-09-18 DIAGNOSIS — N401 Enlarged prostate with lower urinary tract symptoms: Secondary | ICD-10-CM | POA: Diagnosis not present

## 2022-09-18 DIAGNOSIS — N179 Acute kidney failure, unspecified: Secondary | ICD-10-CM | POA: Diagnosis not present

## 2022-09-18 LAB — BASIC METABOLIC PANEL
Anion gap: 11 (ref 5–15)
BUN: 20 mg/dL (ref 8–23)
CO2: 19 mmol/L — ABNORMAL LOW (ref 22–32)
Calcium: 8.7 mg/dL — ABNORMAL LOW (ref 8.9–10.3)
Chloride: 115 mmol/L — ABNORMAL HIGH (ref 98–111)
Creatinine, Ser: 1.24 mg/dL (ref 0.61–1.24)
GFR, Estimated: >60 mL/min (ref >60)
Glucose, Bld: 97 mg/dL (ref 70–99)
Potassium: 4.2 mmol/L (ref 3.5–5.1)
Sodium: 145 mmol/L (ref 135–145)

## 2022-09-18 MED ORDER — DIVALPROEX SODIUM 125 MG PO DR TAB
125.0000 mg | DELAYED_RELEASE_TABLET | Freq: Two times a day (BID) | ORAL | Status: DC
  Administered 2022-09-18 – 2022-09-19 (×4): 125 mg via ORAL
  Filled 2022-09-18 (×6): qty 1

## 2022-09-18 MED ORDER — METOPROLOL TARTRATE 25 MG PO TABS
25.0000 mg | ORAL_TABLET | Freq: Two times a day (BID) | ORAL | Status: DC
  Administered 2022-09-18 – 2022-09-19 (×4): 25 mg via ORAL
  Filled 2022-09-18 (×5): qty 1

## 2022-09-18 MED ORDER — CLONIDINE HCL 0.1 MG/24HR TD PTWK
0.1000 mg | MEDICATED_PATCH | TRANSDERMAL | Status: DC
  Administered 2022-09-18: 0.1 mg via TRANSDERMAL
  Filled 2022-09-18: qty 1

## 2022-09-18 NOTE — Progress Notes (Addendum)
Phlebotomist made me aware that pt refused routine blood lab work this am. Attempted several times to convince pt but he still refused.   Phlebotomist stated that will request blood draw at a later time today.

## 2022-09-18 NOTE — Progress Notes (Signed)
[facility] PROGRESS NOTE    Progress Note  Vincent Cummings  SFS:[id number] DOB: 26-Sep-1946 DOA: 09/15/2022 PCP: Marinda Elk, MD     Brief Narrative:   Vincent Cummings is an 76 y.o. male past medical history significant for essential hypertension dementia (per son) chronic respiratory failure with hypoxia on oxygen at home due to COVID-19, chronic kidney disease stage IIIa, he was brought as per staff report they noticed him that he was laying around more than normal when he is usually ambulatory, the staff reports an episode having dark tarry stools and swelling of his left elbow, in the ED he remained relatively stable satting greater than 90% on room air, mild hyperkalemia 5.2 leukocytosis of 16 hemoglobin of 16, platelet count of 490, with a BUN of 182, and a creatinine of 14 (with a baseline creatinine of around 1) CT scan of the abdomen pelvis showed bilateral nephrolithiasis, bilateral hydronephrosis but no stones, CT of the head showed chronic microvascular ischemic changes    Assessment/Plan:   Acute metabolic encephalopathy: Status post 4 L of fluid resuscitation. CT of the head showed age associated atrophy. UDS negative. BUN of almost 200 and a creatinine of 13 on admission. Ammonia, salicylate and Tylenol level were unremarkable. Physical therapy evaluated the patient, and Occupational Therapy will need rehab placement.  Acute renal failure superimposed on stage 3a chronic kidney disease (HCC) Foley was placed in the ED 2 L came out. Nephrology was consulted recommended no indication for dialysis disease poor dialysis candidate short-term and long-term. Possibly due to postobstructive uropathy and ACE inhibitor use plus minus decreased oral intake. Discontinue ACE inhibitor. KVO half-normal saline Basic metabolic panels pending this morning.  Hyperkalemia He was given Lokelma and bicarb drip potassium is improved.  SIRS/lactic acidosis: Due to acute  kidney injury, no source of infection. Admission was started on bank and cefepime Has remained afebrile, leukocytosis improved Culture data has remained negative till date. Discontinue antibiotics, he has completed 3-day course.  Possible BPH: Foley was placed 2 L of fluid came out. Continue Flomax having good urine output will need to see urology as an outpatient.  Positive FOBT: Admission his hemoglobin was 16 after fluid resuscitation is now 14. There are no signs of overt bleeding. Hemoglobin has remained stable can resume aspirin as an outpatient.  Essential hypertension: Hold ACE inhibitor Norvasc and clonidine. Continue metoprolol blood pressures relatively well controlled.  Elevated AST: Denies any abdominal pain we will continue to monitor.  Thrombocytosis: Likely reactive.  Bilateral nephrolithiasis: No signs of obstruction noted.  Dementia with behavioral disturbances: Continue Depakote and Seroquel. PT OT evaluation.  Sacral cubitus ulcer stage I RN Pressure Injury Documentation: Pressure Injury 02/27/20 Coccyx Stage 1 -  Intact skin with non-blanchable redness of a localized area usually over a bony prominence. (Active)  02/27/20 0500  Location: Coccyx  Location Orientation:   Staging: Stage 1 -  Intact skin with non-blanchable redness of a localized area usually over a bony prominence.  Wound Description (Comments):   Present on Admission: Yes    Estimated body mass index is 22.76 kg/m as calculated from the following:   Height as of this encounter: 5\' 8"  (1.727 m).   Weight as of this encounter: 67.9 kg.     DVT prophylaxis: lovenox Family Communication:none Status is: Inpatient Remains inpatient appropriate because: Acute metabolic encephalopathy no acute kidney injury    Code Status:     Code Status Orders  (From admission, onward)  Start     Ordered   09/16/22 0745  Do not attempt resuscitation (DNR)  Continuous        Question Answer Comment  In the event of cardiac or respiratory ARREST Do not call a "code blue"   In the event of cardiac or respiratory ARREST Do not perform Intubation, CPR, defibrillation or ACLS   In the event of cardiac or respiratory ARREST Use medication by any route, position, wound care, and other measures to relive pain and suffering. May use oxygen, suction and manual treatment of airway obstruction as needed for comfort.      09/16/22 0753           Code Status History     Date Active Date Inactive Code Status Order ID Comments User Context   09/16/2022 0503 09/16/2022 0753 DNR [id number]  Rhetta Mura, DO ED   09/15/2022 2224 09/16/2022 0503 DNR [id number]  Montine Circle, PA-C ED   03/02/2022 1535 09/15/2022 2204 DNR [id number]  Nickola Major, NP Outpatient   02/27/2020 0311 03/04/2020 1934 Full Code [id number]  Shalhoub, Sherryll Burger, MD ED         IV Access:   Peripheral IV   Procedures and diagnostic studies:   No results found.   Medical Consultants:   None.   Subjective:    Vincent Cummings no complaints today.  Objective:    Vitals:   09/17/22 2047 09/18/22 0443 09/18/22 0500 09/18/22 0728  BP: 111/72 (!) 120/90  (!) 159/95  Pulse: 94 92  98  Resp: (!) 22 17  17   Temp: 98.1 F (36.7 C) 98.9 F (37.2 C)  98.3 F (36.8 C)  TempSrc: Axillary Axillary  Oral  SpO2: 100% 99%  99%  Weight:   67.9 kg   Height:       SpO2: 99 %   Intake/Output Summary (Last 24 hours) at 09/18/2022 0840 Last data filed at 09/18/2022 0538 Gross per 24 hour  Intake 1387.05 ml  Output 2050 ml  Net -662.95 ml    Filed Weights   09/15/22 2304 09/17/22 0500 09/18/22 0500  Weight: 79.4 kg 68.4 kg 67.9 kg    Exam: General exam: In no acute distress. Respiratory system: Good air movement and clear to auscultation. Cardiovascular system: S1 & S2 heard, RRR. No JVD. Gastrointestinal system: Abdomen is nondistended, soft and nontender.  Extremities:  No pedal edema. Skin: No rashes, lesions or ulcers Psychiatry: Judgement and insight appear normal. Mood & affect appropriate.  Data Reviewed:    Labs: Basic Metabolic Panel: Recent Labs  Lab 09/16/22 0035 09/16/22 0324 09/16/22 0700 09/16/22 0748 09/17/22 0951  NA 135  --   --  144 146*  K 7.2* 5.2*  --  5.2* 5.3*  CL 100  --   --  110 117*  CO2 14*  --   --  22 20*  GLUCOSE 107* 176*  --  169* 98  BUN 182*  --   --  120* 30*  CREATININE 13.67*  --   --  7.18* 1.70*  CALCIUM 8.7*  --   --  8.5* 8.8*  MG  --   --  2.2  --   --   PHOS  --   --  5.1* 5.2* 2.3*    GFR Estimated Creatinine Clearance: 36.1 mL/min (A) (by C-G formula based on SCr of 1.7 mg/dL (H)). Liver Function Tests: Recent Labs  Lab 09/16/22 0035 09/16/22 0748 09/17/22 0951  AST 34  --   --  ALT 73*  --   --   ALKPHOS 88  --   --   BILITOT 0.7  --   --   PROT 6.7  --   --   ALBUMIN 2.3* 2.0* 2.1*    No results for input(s): "LIPASE", "AMYLASE" in the last 168 hours. Recent Labs  Lab 09/16/22 0240  AMMONIA 33    Coagulation profile Recent Labs  Lab 09/16/22 0015  INR 1.4*    COVID-19 Labs  No results for input(s): "DDIMER", "FERRITIN", "LDH", "CRP" in the last 72 hours.  Lab Results  Component Value Date   SARSCOV2NAA NEGATIVE 09/15/2022    CBC: Recent Labs  Lab 09/15/22 2315 09/16/22 0700 09/16/22 1602 09/17/22 0951  WBC 16.9* 12.1*  --  11.4*  NEUTROABS 14.6* 10.2*  --   --   HGB 15.9 13.9 13.7 13.8  HCT 45.9 39.4 40.3 42.3  MCV 91.1 90.4  --  95.7  PLT 490* 404*  --  396    Cardiac Enzymes: No results for input(s): "CKTOTAL", "CKMB", "CKMBINDEX", "TROPONINI" in the last 168 hours. BNP (last 3 results) No results for input(s): "PROBNP" in the last 8760 hours. CBG: Recent Labs  Lab 09/16/22 0224 09/16/22 0300 09/16/22 0359 09/16/22 0500 09/16/22 0829  GLUCAP 95 170* 104* 123* 101*    D-Dimer: No results for input(s): "DDIMER" in the last 72 hours. Hgb  A1c: No results for input(s): "HGBA1C" in the last 72 hours. Lipid Profile: No results for input(s): "CHOL", "HDL", "LDLCALC", "TRIG", "CHOLHDL", "LDLDIRECT" in the last 72 hours. Thyroid function studies: No results for input(s): "TSH", "T4TOTAL", "T3FREE", "THYROIDAB" in the last 72 hours.  Invalid input(s): "FREET3" Anemia work up: No results for input(s): "VITAMINB12", "FOLATE", "FERRITIN", "TIBC", "IRON", "RETICCTPCT" in the last 72 hours. Sepsis Labs: Recent Labs  Lab 09/15/22 2222 09/15/22 2315 09/16/22 0022 09/16/22 0700 09/16/22 0815 09/17/22 0951  WBC  --  16.9*  --  12.1*  --  11.4*  LATICACIDVEN 2.3*  --  2.2*  --  1.5  --     Microbiology Recent Results (from the past 240 hour(s))  Urine Culture     Status: None   Collection Time: 09/15/22 10:22 PM   Specimen: In/Out Cath Urine  Result Value Ref Range Status   Specimen Description IN/OUT CATH URINE  Final   Special Requests NONE  Final   Culture   Final    NO GROWTH Performed at [facility], [address] [place], [place] [address]    Report Status 09/16/2022 FINAL  Final  Blood Culture (routine x 2)     Status: None (Preliminary result)   Collection Time: 09/15/22 11:10 PM   Specimen: BLOOD LEFT FOREARM  Result Value Ref Range Status   Specimen Description BLOOD LEFT FOREARM  Final   Special Requests   Final    BOTTLES DRAWN AEROBIC AND ANAEROBIC Blood Culture results may not be optimal due to an inadequate volume of blood received in culture bottles   Culture   Final    NO GROWTH 2 DAYS Performed at [facility], [address] [place], [place] [address]    Report Status PENDING  Incomplete  Blood Culture (routine x 2)     Status: None (Preliminary result)   Collection Time: 09/15/22 11:15 PM   Specimen: BLOOD RIGHT FOREARM  Result Value Ref Range Status   Specimen Description BLOOD RIGHT FOREARM  Final   Special Requests   Final  BOTTLES DRAWN AEROBIC AND ANAEROBIC Blood Culture  results may not be optimal due to an inadequate volume of blood received in culture bottles   Culture   Final    NO GROWTH 2 DAYS Performed at [facility], [address], [place], [place] [address]    Report Status PENDING  Incomplete  Resp Panel by RT-PCR (Flu A&B, Covid) Anterior Nasal Swab     Status: None   Collection Time: 09/15/22 11:20 PM   Specimen: Anterior Nasal Swab  Result Value Ref Range Status   SARS Coronavirus 2 by RT PCR NEGATIVE NEGATIVE Final    Comment: (NOTE) SARS-CoV-2 target nucleic acids are NOT DETECTED.  The SARS-CoV-2 RNA is generally detectable in upper respiratory specimens during the acute phase of infection. The lowest concentration of SARS-CoV-2 viral copies this assay can detect is 138 copies/mL. A negative result does not preclude SARS-Cov-2 infection and should not be used as the sole basis for treatment or other patient management decisions. A negative result may occur with  improper specimen collection/handling, submission of specimen other than nasopharyngeal swab, presence of viral mutation(s) within the areas targeted by this assay, and inadequate number of viral copies(<138 copies/mL). A negative result must be combined with clinical observations, patient history, and epidemiological information. The expected result is Negative.  Fact Sheet for Patients:  BloggerCourse.com  Fact Sheet for Healthcare Providers:  SeriousBroker.it  This test is no t yet approved or cleared by the [place] FDA and  has been authorized for detection and/or diagnosis of SARS-CoV-2 by FDA under an Emergency Use Authorization (EUA). This EUA will remain  in effect (meaning this test can be used) for the duration of the COVID-19 declaration under Section 564(b)(1) of the Act, 21 U.S.C.section 360bbb-3(b)(1), unless the authorization is terminated  or revoked sooner.       Influenza A by PCR  NEGATIVE NEGATIVE Final   Influenza B by PCR NEGATIVE NEGATIVE Final    Comment: (NOTE) The Xpert Xpress SARS-CoV-2/FLU/RSV plus assay is intended as an aid in the diagnosis of influenza from Nasopharyngeal swab specimens and should not be used as a sole basis for treatment. Nasal washings and aspirates are unacceptable for Xpert Xpress SARS-CoV-2/FLU/RSV testing.  Fact Sheet for Patients: BloggerCourse.com  Fact Sheet for Healthcare Providers: SeriousBroker.it  This test is not yet approved or cleared by the [place] FDA and has been authorized for detection and/or diagnosis of SARS-CoV-2 by FDA under an Emergency Use Authorization (EUA). This EUA will remain in effect (meaning this test can be used) for the duration of the COVID-19 declaration under Section 564(b)(1) of the Act, 21 U.S.C. section 360bbb-3(b)(1), unless the authorization is terminated or revoked.  Performed at [facility], [address], [place], [place] [address]      Medications:    Chlorhexidine Gluconate Cloth  6 each Topical Daily   divalproex  125 mg Oral Q12H   docusate  100 mg Oral BID   metoprolol tartrate  25 mg Oral BID   pantoprazole (PROTONIX) IV  40 mg Intravenous Q24H   QUEtiapine  12.5 mg Oral BID   sodium chloride flush  3 mL Intravenous Q12H   sodium zirconium cyclosilicate  10 g Oral Once   tamsulosin  0.4 mg Oral Daily   Continuous Infusions:  sodium chloride Stopped (09/17/22 2057)   ceFEPime (MAXIPIME) IV Stopped (09/17/22 2109)   vancomycin Stopped (09/17/22 1643)      LOS: 2 days  Marinda Elk  [facility]  09/18/2022, 8:40 AM

## 2022-09-18 NOTE — Plan of Care (Signed)
  Problem: Clinical Measurements: Goal: Will remain free from infection Outcome: Progressing Goal: Respiratory complications will improve Outcome: Progressing Goal: Cardiovascular complication will be avoided Outcome: Progressing   Problem: Nutrition: Goal: Adequate nutrition will be maintained Outcome: Progressing   Problem: Elimination: Goal: Will not experience complications related to bowel motility Outcome: Progressing Goal: Will not experience complications related to urinary retention Outcome: Progressing

## 2022-09-19 DIAGNOSIS — T68XXXA Hypothermia, initial encounter: Secondary | ICD-10-CM

## 2022-09-19 DIAGNOSIS — N1330 Unspecified hydronephrosis: Secondary | ICD-10-CM | POA: Diagnosis not present

## 2022-09-19 DIAGNOSIS — E875 Hyperkalemia: Secondary | ICD-10-CM | POA: Diagnosis not present

## 2022-09-19 DIAGNOSIS — N179 Acute kidney failure, unspecified: Secondary | ICD-10-CM | POA: Diagnosis not present

## 2022-09-19 LAB — BASIC METABOLIC PANEL
Anion gap: 8 (ref 5–15)
BUN: 16 mg/dL (ref 8–23)
CO2: 19 mmol/L — ABNORMAL LOW (ref 22–32)
Calcium: 8.4 mg/dL — ABNORMAL LOW (ref 8.9–10.3)
Chloride: 112 mmol/L — ABNORMAL HIGH (ref 98–111)
Creatinine, Ser: 1.25 mg/dL — ABNORMAL HIGH (ref 0.61–1.24)
GFR, Estimated: >60 mL/min (ref >60)
Glucose, Bld: 89 mg/dL (ref 70–99)
Potassium: 3.9 mmol/L (ref 3.5–5.1)
Sodium: 139 mmol/L (ref 135–145)

## 2022-09-19 MED ORDER — SODIUM BICARBONATE 650 MG PO TABS
650.0000 mg | ORAL_TABLET | Freq: Three times a day (TID) | ORAL | Status: DC
  Administered 2022-09-19 (×3): 650 mg via ORAL
  Filled 2022-09-19 (×4): qty 1

## 2022-09-19 NOTE — TOC Initial Note (Signed)
Transition of Care [facility]) - Initial/Assessment Note    Patient Details  Name: Vincent Cummings MRN: [id number] Date of Birth: 09-22-1946  [facility]) CM/SW Contact:    Delilah Shan, LCSWA Phone Number: 09/19/2022, 10:39 AM  Clinical Narrative:                  CSW received consult for possible SNF placement at time of discharge. Due to patients current orientation CSW spoke with patients son [name] regarding PT recommendation of SNF placement at time of discharge. Patients son [name] reports patient comes from [facility]. Patients son expressed understanding of PT recommendation and is agreeable for patient to receive short term rehab at [facility] when patient is medically ready for dc. CSW discussed insurance authorization process. Patients son reports patient is Covid Vaccinated. No further questions reported at this time. CSW to continue to follow and assist with discharge planning needs.   Expected Discharge Plan: Skilled Nursing Facility Barriers to Discharge: Continued Medical Work up   Patient Goals and CMS Choice   CMS Medicare.gov Compare Post Acute Care list provided to:: Patient Represenative (must comment) (Patients son [name]) Choice offered to / list presented to : Adult Children (Patients son [name])  Expected Discharge Plan and Services Expected Discharge Plan: Skilled Nursing Facility In-house Referral: Clinical Social Work     Living arrangements for the past 2 months: Skilled Nursing Facility                                      Prior Living Arrangements/Services Living arrangements for the past 2 months: Skilled Nursing Facility Lives with:: Facility Resident (Patient is from [facility]) Patient language and need for interpreter reviewed:: Yes Do you feel safe going back to the place where you live?: Yes      Need for Family Participation in Patient Care: Yes (Comment) Care giver support system in place?: Yes  (comment)   Criminal Activity/Legal Involvement Pertinent to Current Situation/Hospitalization: No - Comment as needed  Activities of Daily Living      Permission Sought/Granted Permission sought to share information with : Case Manager, Family Supports, Oceanographer granted to share information with : No  Share Information with NAME: Due to patients current orienation CSW spoke with patients son [name]  Permission granted to share info w AGENCY: Due to patients current orienation CSW spoke with patients son [name]/SNF  Permission granted to share info w Relationship: Due to patients current orienation CSW spoke with patients son [name]  Permission granted to share info w Contact Information: Due to patients current orienation CSW spoke with patients son [name] [phone]  Emotional Assessment       Orientation: : Oriented to Self Alcohol / Substance Use: Not Applicable Psych Involvement: No (comment)  Admission diagnosis:  Hyperkalemia [E87.5] Uremia [N19] ARF (acute renal failure) (HCC) [N17.9] Hypothermia, initial encounter [T68.XXXA] Acute renal failure, unspecified acute renal failure type (HCC) [N17.9] Altered mental status, unspecified altered mental status type [R41.82] Acute renal failure superimposed on stage 3a chronic kidney disease (HCC) [N17.9, N18.31] Patient Active Problem List   Diagnosis Date Noted   Acute renal failure superimposed on stage 3a chronic kidney disease (HCC) 09/16/2022   SIRS (systemic inflammatory response syndrome) (HCC) 09/16/2022   Lactic acidosis 09/16/2022   Hyperkalemia 09/16/2022   Positive fecal occult blood test 09/16/2022   Elevated ALT measurement 09/16/2022  Thrombocytosis 09/16/2022   Nephrolithiasis 09/16/2022   Bilateral hydronephrosis 09/16/2022   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   ARF (acute renal failure) ([place]) 09/16/2022   At increased risk of exposure to severe acute respiratory  syndrome coronavirus 2 (SARS-CoV-2) 12/28/2021   CKD (chronic kidney disease), stage III ([place]) 05/28/2021   Mass of elbow region, left 05/28/2021   Acute respiratory failure with hypoxia ([place]) 03/12/2021   Right knee pain 03/31/2020   Gait abnormality 03/31/2020   Dementia with behavioral disturbance ([place]) 03/31/2020   Neurocognitive deficits 03/11/2020   Pressure injury of coccygeal region, stage 1 03/06/2020   Pneumonia due to COVID-19 virus 21/62/4469   Acute metabolic encephalopathy 50/72/2575   Hyperglycemia 02/27/2020   GERD without esophagitis 02/27/2020   Sepsis due to COVID-19 [facility]) 02/27/2020   Gout 01/08/2015   Thyroid nodule 10/31/2014   HLD (hyperlipidemia) 10/31/2014   Arthritis of knee, degenerative 07/30/2014   Dizziness 07/30/2014   Left knee pain 12/16/2013   Essential hypertension 03/17/2012   PCP:  Elmore Guise, MD Pharmacy:   [place], [place] - [address] [place] [address] Phone: [phone] Fax: [phone]     Social Determinants of Health (SDOH) Interventions    Readmission Risk Interventions     No data to display

## 2022-09-19 NOTE — [place] FL2 (Signed)
[place] MEDICAID FL2 LEVEL OF CARE SCREENING TOOL     IDENTIFICATION  Patient Name: Vincent Cummings Birthdate: Jul 16, 1946 Sex: male Admission Date (Current Location): 09/15/2022  [facility] and [place] Number:  Herbalist and Address:  [facility], [place] [address], [place], [place] [address]      Provider Number: [id number]  Attending Physician Name and Address:  Charlynne Cousins, MD  Relative Name and Phone Number:       Current Level of Care: Hospital Recommended Level of Care: [place] Prior Approval Number:    Date Approved/Denied:   PASRR Number: [id number]  Discharge Plan: SNF    Current Diagnoses: Patient Active Problem List   Diagnosis Date Noted   Acute renal failure superimposed on stage 3a chronic kidney disease ([place]) 09/16/2022   SIRS (systemic inflammatory response syndrome) ([place]) 09/16/2022   Lactic acidosis 09/16/2022   Hyperkalemia 09/16/2022   Positive fecal occult blood test 09/16/2022   Elevated ALT measurement 09/16/2022   Thrombocytosis 09/16/2022   Nephrolithiasis 09/16/2022   Bilateral hydronephrosis 09/16/2022   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   ARF (acute renal failure) ([place]) 09/16/2022   At increased risk of exposure to severe acute respiratory syndrome coronavirus 2 (SARS-CoV-2) 12/28/2021   CKD (chronic kidney disease), stage III ([place]) 05/28/2021   Mass of elbow region, left 05/28/2021   Acute respiratory failure with hypoxia (HCC) 03/12/2021   Right knee pain 03/31/2020   Gait abnormality 03/31/2020   Dementia with behavioral disturbance ([place]) 03/31/2020   Neurocognitive deficits 03/11/2020   Pressure injury of coccygeal region, stage 1 03/06/2020   Pneumonia due to COVID-19 virus 77/82/4235   Acute metabolic encephalopathy 36/14/4315   Hyperglycemia 02/27/2020   GERD without esophagitis 02/27/2020   Sepsis due to COVID-19 ([place]) 02/27/2020   Gout 01/08/2015   Thyroid  nodule 10/31/2014   HLD (hyperlipidemia) 10/31/2014   Arthritis of knee, degenerative 07/30/2014   Dizziness 07/30/2014   Left knee pain 12/16/2013   Essential hypertension 03/17/2012    Orientation RESPIRATION BLADDER Height & Weight     Self  Normal Incontinent (Urethral Catheter) Weight: 149 lb 11.1 oz (67.9 kg) Height:  5\' 8"  (172.7 cm)  BEHAVIORAL SYMPTOMS/MOOD NEUROLOGICAL BOWEL NUTRITION STATUS      Incontinent Diet (Please see discharge summary)  AMBULATORY STATUS COMMUNICATION OF NEEDS Skin   Extensive Assist Verbally Other (Comment) (dry,erythema,buttocks,bilateral,non-tenting,wound Incision LDAs)                       Personal Care Assistance Level of Assistance  Bathing, Feeding, Dressing Bathing Assistance: Maximum assistance Feeding assistance: Limited assistance Dressing Assistance: Maximum assistance     Functional Limitations Info  Sight, Hearing, Speech Sight Info: Adequate (WDL) Hearing Info: Adequate Speech Info: Adequate    SPECIAL CARE FACTORS FREQUENCY  PT (By licensed PT), OT (By licensed OT)     PT Frequency: 5x min weekly OT Frequency: 5x min weekly            Contractures Contractures Info: Not present    Additional Factors Info  Code Status, Allergies, Psychotropic Code Status Info: DNR Allergies Info: Atorvastatin Psychotropic Info: QUEtiapine (SEROQUEL) tablet 12.5 mg 2 times daily         Current Medications (09/19/2022):  This is the current hospital active medication list Current Facility-Administered Medications  Medication Dose Route Frequency Provider Last Rate Last Admin   acetaminophen (TYLENOL) tablet 650 mg  650 mg Oral Q6H  PRN Howerter, Larkin Ina B, DO       Or   acetaminophen (TYLENOL) suppository 650 mg  650 mg Rectal Q6H PRN Howerter, Justin B, DO       albuterol (PROVENTIL) (2.5 MG/3ML) 0.083% nebulizer solution 2.5 mg  2.5 mg Nebulization Q6H PRN Fuller Plan A, MD       Chlorhexidine Gluconate Cloth 2 %  PADS 6 each  6 each Topical Daily Fuller Plan A, MD   6 each at 09/19/22 0841   cloNIDine (CATAPRES - Dosed in mg/24 hr) patch 0.1 mg  0.1 mg Transdermal Weekly Charlynne Cousins, MD   0.1 mg at 09/18/22 0936   divalproex (DEPAKOTE) DR tablet 125 mg  125 mg Oral BID Charlynne Cousins, MD   125 mg at 09/19/22 0841   docusate (COLACE) 50 MG/5ML liquid 100 mg  100 mg Oral BID Reome, Earle J, RPH   100 mg at 09/19/22 0841   metoprolol tartrate (LOPRESSOR) tablet 25 mg  25 mg Oral BID Charlynne Cousins, MD   25 mg at 09/19/22 0841   ondansetron (ZOFRAN) tablet 4 mg  4 mg Oral Q6H PRN Norval Morton, MD       Or   ondansetron (ZOFRAN) injection 4 mg  4 mg Intravenous Q6H PRN Fuller Plan A, MD       pantoprazole (PROTONIX) injection 40 mg  40 mg Intravenous Q24H Smith, Rondell A, MD   40 mg at 09/19/22 0841   polyethylene glycol (MIRALAX / GLYCOLAX) packet 17 g  17 g Oral Daily PRN Fuller Plan A, MD       QUEtiapine (SEROQUEL) tablet 12.5 mg  12.5 mg Oral BID Reome, Earle J, RPH   12.5 mg at 09/19/22 0841   sodium bicarbonate tablet 650 mg  650 mg Oral TID Charlynne Cousins, MD   650 mg at 09/19/22 0841   sodium chloride flush (NS) 0.9 % injection 3 mL  3 mL Intravenous Q12H Smith, Rondell A, MD   3 mL at 09/17/22 2124   tamsulosin (FLOMAX) capsule 0.4 mg  0.4 mg Oral Daily Reome, Earle J, RPH   0.4 mg at 09/19/22 [id number]     Discharge Medications: Please see discharge summary for a list of discharge medications.  Relevant Imaging Results:  Relevant Lab Results:   Additional Information [id number]  Milas Gain, LCSWA

## 2022-09-19 NOTE — Progress Notes (Signed)
[facility] PROGRESS NOTE    Progress Note  Vincent Cummings  ZOX:[id number] DOB: 04-15-1946 DOA: 09/15/2022 PCP: Marinda Elkemarchi, William M, MD     Brief Narrative:   Vincent Cummings is an 76 y.o. male past medical history significant for essential hypertension dementia (per son) chronic respiratory failure with hypoxia on oxygen at home due to COVID-19, chronic kidney disease stage IIIa, he was brought as per staff report they noticed him that he was laying around more than normal when he is usually ambulatory, the staff reports an episode having dark tarry stools and swelling of his left elbow, in the ED he remained relatively stable satting greater than 90% on room air, mild hyperkalemia 5.2 leukocytosis of 16 hemoglobin of 16, platelet count of 490, with a BUN of 182, and a creatinine of 14 (with a baseline creatinine of around 1) CT scan of the abdomen pelvis showed bilateral nephrolithiasis, bilateral hydronephrosis but no stones, CT of the head showed chronic microvascular ischemic changes Patient is medically stable for transfer to skilled nursing facility   Assessment/Plan:   Acute metabolic encephalopathy: Status post 4 L of fluid resuscitation. CT of the head showed age associated atrophy. UDS negative. BUN of almost 200 and a creatinine of 13 on admission. Ammonia, salicylate and Tylenol level were unremarkable. PT and OT evaluated the patient, awaiting skilled nursing facility placement.  Acute renal failure superimposed on stage 3a chronic kidney disease (HCC) Foley was placed in the ED 2 L came out.  Acute kidney injury has been holding Nephrology was consulted recommended no indication for dialysis disease poor dialysis candidate short-term and long-term. Possibly due to postobstructive uropathy and ACE inhibitor use plus minus decreased oral intake. ACE inhibitor has been held. His creatinine this morning is 1.2 probably this is baseline.  Hyperkalemia Resolved  bicarbonate and Kayexalate. This morning is 3.9.  SIRS/lactic acidosis: Due to acute kidney injury, no source of infection. Admission was started on Vanc and cefepime Has remained afebrile, leukocytosis improved Culture data has remained negative till date. Discontinue antibiotics, he has completed 3-day course. Start bicarbonate tablets.  Possible BPH: Foley was placed 2 L of fluid came out. Continue Flomax having good urine output will need to see urology as an outpatient.  Positive FOBT: Admission his hemoglobin was 16 after fluid resuscitation is now 14. There are no signs of overt bleeding. Hemoglobin has remained stable can resume aspirin as an outpatient.  Essential hypertension: Continue hold ACE inhibitor Norvasc and clonidine. Blood pressures relatively well controlled continue metoprolol.  Elevated AST: Denies any abdominal pain we will continue to monitor.  Thrombocytosis: Likely reactive.  Now resolved.  Bilateral nephrolithiasis: No signs of obstruction noted.  Dementia with behavioral disturbances: Continue Depakote and Seroquel. PT OT evaluation.  Sacral cubitus ulcer stage I RN Pressure Injury Documentation: Pressure Injury 02/27/20 Coccyx Stage 1 -  Intact skin with non-blanchable redness of a localized area usually over a bony prominence. (Active)  02/27/20 0500  Location: Coccyx  Location Orientation:   Staging: Stage 1 -  Intact skin with non-blanchable redness of a localized area usually over a bony prominence.  Wound Description (Comments):   Present on Admission: Yes    Estimated body mass index is 22.76 kg/m as calculated from the following:   Height as of this encounter: 5\' 8"  (1.727 m).   Weight as of this encounter: 67.9 kg.     DVT prophylaxis: lovenox Family Communication:none Status is: Inpatient Remains inpatient appropriate because: Acute metabolic  encephalopathy no acute kidney injury    Code Status:     Code Status  Orders  (From admission, onward)           Start     Ordered   09/16/22 0745  Do not attempt resuscitation (DNR)  Continuous       Question Answer Comment  In the event of cardiac or respiratory ARREST Do not call a "code blue"   In the event of cardiac or respiratory ARREST Do not perform Intubation, CPR, defibrillation or ACLS   In the event of cardiac or respiratory ARREST Use medication by any route, position, wound care, and other measures to relive pain and suffering. May use oxygen, suction and manual treatment of airway obstruction as needed for comfort.      09/16/22 0753           Code Status History     Date Active Date Inactive Code Status Order ID Comments User Context   09/16/2022 0503 09/16/2022 0753 DNR [id number]  Angie Fava, DO ED   09/15/2022 2224 09/16/2022 0503 DNR [id number]  Roxy Horseman, PA-C ED   03/02/2022 1535 09/15/2022 2204 DNR [id number]  Gillis Santa, NP Outpatient   02/27/2020 0311 03/04/2020 1934 Full Code [id number]  Shalhoub, Deno Lunger, MD ED         IV Access:   Peripheral IV   Procedures and diagnostic studies:   No results found.   Medical Consultants:   None.   Subjective:    Vincent Cummings no complaints today  Objective:    Vitals:   09/18/22 1912 09/18/22 2100 09/18/22 2300 09/19/22 0200  BP: (!) 118/99     Pulse: 89 90 72 83  Resp: 14 19 19 18   Temp: 98 F (36.7 C)     TempSrc: Oral     SpO2: 100% 96% 98% 99%  Weight:      Height:       SpO2: 99 %   Intake/Output Summary (Last 24 hours) at 09/19/2022 0805 Last data filed at 09/18/2022 1300 Gross per 24 hour  Intake 480 ml  Output 850 ml  Net -370 ml    Filed Weights   09/15/22 2304 09/17/22 0500 09/18/22 0500  Weight: 79.4 kg 68.4 kg 67.9 kg    Exam: General exam: In no acute distress. Respiratory system: Good air movement and clear to auscultation. Cardiovascular system: S1 & S2 heard, RRR. No JVD. Gastrointestinal system:  Abdomen is nondistended, soft and nontender.  Extremities: No pedal edema. Skin: No rashes, lesions or ulcers  Data Reviewed:    Labs: Basic Metabolic Panel: Recent Labs  Lab 09/16/22 0035 09/16/22 0324 09/16/22 0700 09/16/22 0748 09/17/22 0951 09/18/22 0907 09/19/22 0342  NA 135  --   --  144 146* 145 139  K 7.2* 5.2*  --  5.2* 5.3* 4.2 3.9  CL 100  --   --  110 117* 115* 112*  CO2 14*  --   --  22 20* 19* 19*  GLUCOSE 107* 176*  --  169* 98 97 89  BUN 182*  --   --  120* 30* 20 16  CREATININE 13.67*  --   --  7.18* 1.70* 1.24 1.25*  CALCIUM 8.7*  --   --  8.5* 8.8* 8.7* 8.4*  MG  --   --  2.2  --   --   --   --   PHOS  --   --  5.1* 5.2*  2.3*  --   --     GFR Estimated Creatinine Clearance: 49 mL/min (A) (by C-G formula based on SCr of 1.25 mg/dL (H)). Liver Function Tests: Recent Labs  Lab 09/16/22 0035 09/16/22 0748 09/17/22 0951  AST 34  --   --   ALT 73*  --   --   ALKPHOS 88  --   --   BILITOT 0.7  --   --   PROT 6.7  --   --   ALBUMIN 2.3* 2.0* 2.1*    No results for input(s): "LIPASE", "AMYLASE" in the last 168 hours. Recent Labs  Lab 09/16/22 0240  AMMONIA 33    Coagulation profile Recent Labs  Lab 09/16/22 0015  INR 1.4*    COVID-19 Labs  No results for input(s): "DDIMER", "FERRITIN", "LDH", "CRP" in the last 72 hours.  Lab Results  Component Value Date   [place] NEGATIVE 09/15/2022    CBC: Recent Labs  Lab 09/15/22 2315 09/16/22 0700 09/16/22 1602 09/17/22 0951  WBC 16.9* 12.1*  --  11.4*  NEUTROABS 14.6* 10.2*  --   --   HGB 15.9 13.9 13.7 13.8  HCT 45.9 39.4 40.3 42.3  MCV 91.1 90.4  --  95.7  PLT 490* 404*  --  396    Cardiac Enzymes: No results for input(s): "CKTOTAL", "CKMB", "CKMBINDEX", "TROPONINI" in the last 168 hours. BNP (last 3 results) No results for input(s): "PROBNP" in the last 8760 hours. CBG: Recent Labs  Lab 09/16/22 0224 09/16/22 0300 09/16/22 0359 09/16/22 0500 09/16/22 0829  GLUCAP 95  170* 104* 123* 101*    D-Dimer: No results for input(s): "DDIMER" in the last 72 hours. Hgb A1c: No results for input(s): "HGBA1C" in the last 72 hours. Lipid Profile: No results for input(s): "CHOL", "HDL", "LDLCALC", "TRIG", "CHOLHDL", "LDLDIRECT" in the last 72 hours. Thyroid function studies: No results for input(s): "TSH", "T4TOTAL", "T3FREE", "THYROIDAB" in the last 72 hours.  Invalid input(s): "FREET3" Anemia work up: No results for input(s): "VITAMINB12", "FOLATE", "FERRITIN", "TIBC", "IRON", "RETICCTPCT" in the last 72 hours. Sepsis Labs: Recent Labs  Lab 09/15/22 2222 09/15/22 2315 09/16/22 0022 09/16/22 0700 09/16/22 0815 09/17/22 0951  WBC  --  16.9*  --  12.1*  --  11.4*  LATICACIDVEN 2.3*  --  2.2*  --  1.5  --     Microbiology Recent Results (from the past 240 hour(s))  Urine Culture     Status: None   Collection Time: 09/15/22 10:22 PM   Specimen: In/Out Cath Urine  Result Value Ref Range Status   Specimen Description IN/OUT CATH URINE  Final   Special Requests NONE  Final   Culture   Final    NO GROWTH Performed at [facility], [place] [address], [place], [place] [address]    Report Status 09/16/2022 FINAL  Final  Blood Culture (routine x 2)     Status: None (Preliminary result)   Collection Time: 09/15/22 11:10 PM   Specimen: BLOOD LEFT FOREARM  Result Value Ref Range Status   Specimen Description BLOOD LEFT FOREARM  Final   Special Requests   Final    BOTTLES DRAWN AEROBIC AND ANAEROBIC Blood Culture results may not be optimal due to an inadequate volume of blood received in culture bottles   Culture   Final    NO GROWTH 3 DAYS Performed at [facility], [place] [address], [place], [place] [address]    Report Status PENDING  Incomplete  Blood Culture (routine x 2)     Status: None (Preliminary result)   Collection Time: 09/15/22 11:15 PM   Specimen: BLOOD RIGHT FOREARM  Result Value Ref Range Status   Specimen Description BLOOD RIGHT  FOREARM  Final   Special Requests   Final    BOTTLES DRAWN AEROBIC AND ANAEROBIC Blood Culture results may not be optimal due to an inadequate volume of blood received in culture bottles   Culture   Final    NO GROWTH 3 DAYS Performed at [facility], [address], [place], [place] [address]    Report Status PENDING  Incomplete  Resp Panel by RT-PCR (Flu A&B, Covid) Anterior Nasal Swab     Status: None   Collection Time: 09/15/22 11:20 PM   Specimen: Anterior Nasal Swab  Result Value Ref Range Status   SARS Coronavirus 2 by RT PCR NEGATIVE NEGATIVE Final    Comment: (NOTE) SARS-CoV-2 target nucleic acids are NOT DETECTED.  The SARS-CoV-2 RNA is generally detectable in upper respiratory specimens during the acute phase of infection. The lowest concentration of SARS-CoV-2 viral copies this assay can detect is 138 copies/mL. A negative result does not preclude SARS-Cov-2 infection and should not be used as the sole basis for treatment or other patient management decisions. A negative result may occur with  improper specimen collection/handling, submission of specimen other than nasopharyngeal swab, presence of viral mutation(s) within the areas targeted by this assay, and inadequate number of viral copies(<138 copies/mL). A negative result must be combined with clinical observations, patient history, and epidemiological information. The expected result is Negative.  Fact Sheet for Patients:  BloggerCourse.com  Fact Sheet for Healthcare Providers:  SeriousBroker.it  This test is no t yet approved or cleared by the [place] FDA and  has been authorized for detection and/or diagnosis of SARS-CoV-2 by FDA under an Emergency Use Authorization (EUA). This EUA will remain  in effect (meaning this test can be used) for the duration of the COVID-19 declaration under Section 564(b)(1) of the Act, 21 U.S.C.section 360bbb-3(b)(1),  unless the authorization is terminated  or revoked sooner.       Influenza A by PCR NEGATIVE NEGATIVE Final   Influenza B by PCR NEGATIVE NEGATIVE Final    Comment: (NOTE) The Xpert Xpress SARS-CoV-2/FLU/RSV plus assay is intended as an aid in the diagnosis of influenza from Nasopharyngeal swab specimens and should not be used as a sole basis for treatment. Nasal washings and aspirates are unacceptable for Xpert Xpress SARS-CoV-2/FLU/RSV testing.  Fact Sheet for Patients: BloggerCourse.com  Fact Sheet for Healthcare Providers: SeriousBroker.it  This test is not yet approved or cleared by the [place] FDA and has been authorized for detection and/or diagnosis of SARS-CoV-2 by FDA under an Emergency Use Authorization (EUA). This EUA will remain in effect (meaning this test can be used) for the duration of the COVID-19 declaration under Section 564(b)(1) of the Act, 21 U.S.C. section 360bbb-3(b)(1), unless the authorization is terminated or revoked.  Performed at [facility], [address], [place], [place] [address]      Medications:    Chlorhexidine Gluconate Cloth  6 each Topical Daily   cloNIDine  0.1 mg Transdermal Weekly   divalproex  125 mg Oral BID   docusate  100 mg Oral BID   metoprolol tartrate  25 mg Oral BID   pantoprazole (PROTONIX) IV  40 mg Intravenous Q24H   QUEtiapine  12.5 mg Oral BID   sodium  chloride flush  3 mL Intravenous Q12H   sodium zirconium cyclosilicate  10 g Oral Once   tamsulosin  0.4 mg Oral Daily   Continuous Infusions:      LOS: 3 days   Marinda Elk  [facility]  09/19/2022, 8:05 AM

## 2022-09-20 DIAGNOSIS — R195 Other fecal abnormalities: Secondary | ICD-10-CM | POA: Diagnosis not present

## 2022-09-20 DIAGNOSIS — E875 Hyperkalemia: Secondary | ICD-10-CM | POA: Diagnosis not present

## 2022-09-20 DIAGNOSIS — R7401 Elevation of levels of liver transaminase levels: Secondary | ICD-10-CM | POA: Diagnosis not present

## 2022-09-20 DIAGNOSIS — N179 Acute kidney failure, unspecified: Secondary | ICD-10-CM | POA: Diagnosis not present

## 2022-09-20 LAB — BASIC METABOLIC PANEL
Anion gap: 9 (ref 5–15)
BUN: 19 mg/dL (ref 8–23)
CO2: 20 mmol/L — ABNORMAL LOW (ref 22–32)
Calcium: 8.2 mg/dL — ABNORMAL LOW (ref 8.9–10.3)
Chloride: 110 mmol/L (ref 98–111)
Creatinine, Ser: 1.1 mg/dL (ref 0.61–1.24)
GFR, Estimated: >60 mL/min (ref >60)
Glucose, Bld: 80 mg/dL (ref 70–99)
Potassium: 3.8 mmol/L (ref 3.5–5.1)
Sodium: 139 mmol/L (ref 135–145)

## 2022-09-20 MED ORDER — TAMSULOSIN HCL 0.4 MG PO CAPS: 0.4000 mg | ORAL_CAPSULE | Freq: Every day | ORAL | Status: AC

## 2022-09-20 NOTE — Progress Notes (Signed)
Patient is refusing all PO medications.  Attempted to give with ice cream instead of applesauce.  Patient still refused and became agitated.  Patient stated to me "Get Out!" VSS.  Will continue to monitor.

## 2022-09-20 NOTE — Care Management Important Message (Signed)
Important Message  Patient Details  Name: Vincent Cummings MRN: [id number] Date of Birth: [birth year]   Medicare Important Message Given:  Yes     Hannah Beat 09/20/2022, 12:35 PM

## 2022-09-20 NOTE — TOC Transition Note (Signed)
Transition of Care [facility]) - CM/SW Discharge Note   Patient Details  Name: Vincent Cummings MRN: [id number] Date of Birth: Oct 26, 1946  Transition of Care [facility]) CM/SW Contact:  Joanne Chars, LCSW Phone Number: 09/20/2022, 11:48 AM   Clinical Narrative:   Pt discharging to [place].  RN call [phone] for report.     Final next level of care: Skilled Nursing Facility Barriers to Discharge: Barriers Resolved   Patient Goals and CMS Choice   CMS Medicare.gov Compare Post Acute Care list provided to:: Patient Represenative (must comment) (Patients son [name]) Choice offered to / list presented to : Adult Children (Patients son [name])  Discharge Placement              Patient chooses bed at:  [facility]) Patient to be transferred to facility by: [place] Name of family member notified: son [name] Patient and family notified of of transfer: 09/20/22  Discharge Plan and Services In-house Referral: Clinical Social Work                                   Social Determinants of Health (SDOH) Interventions     Readmission Risk Interventions     No data to display

## 2022-09-20 NOTE — Discharge Summary (Addendum)
Physician Discharge Summary  Vincent Cummings:[id number] DOB: 22-Apr-1946 DOA: 09/15/2022  PCP: Marinda Elk, MD  Admit date: 09/15/2022 Discharge date: 09/20/2022  Admitted From: Home Disposition:  Home Recommendations for Outpatient Follow-up:  Follow up with PCP in 1-2 weeks, FOBT was positive will need to be referred to GI for possible colonoscopy. Please obtain BMP/CBC in one week Follow-up with urology in 4 weeks to see if Foley can be discontinued given voiding trial.  Home Health:No Equipment/Devices:None  Discharge Condition:Stable CODE STATUS:Full Diet recommendation: Heart Healthy  Brief/Interim Summary: 76 y.o. male past medical history significant for essential hypertension dementia (per son) chronic respiratory failure with hypoxia on oxygen at home due to COVID-19, chronic kidney disease stage IIIa, he was brought as per staff report they noticed him that he was laying around more than normal when he is usually ambulatory, the staff reports an episode having dark tarry stools and swelling of his left elbow, in the ED he remained relatively stable satting greater than 90% on room air, mild hyperkalemia 5.2 leukocytosis of 16 hemoglobin of 16, platelet count of 490, with a BUN of 182, and a creatinine of 14 (with a baseline creatinine of around 1) CT scan of the abdomen pelvis showed bilateral nephrolithiasis, bilateral hydronephrosis but no stones, CT of the head showed chronic microvascular ischemic changes   Discharge Diagnoses:  Principal Problem:   Acute renal failure superimposed on stage 3a chronic kidney disease (HCC) Active Problems:   Bilateral hydronephrosis   BPH with urinary obstruction   SIRS (systemic inflammatory response syndrome) (HCC)   Lactic acidosis   Acute metabolic encephalopathy   Hyperkalemia   Positive fecal occult blood test   Elevated ALT measurement   Thrombocytosis   Nephrolithiasis   Dementia with behavioral disturbance (HCC)    ARF (acute renal failure) (HCC)  Acute metabolic encephalopathy: CT of the head was done that showed no acute atrophy. UDS was negative.  Possibly uremic encephalopathy. BUN was 200 and creatinine were 13 on admission. Ammonia, salicylate and Tylenol were unremarkable. Physical therapy evaluated the patient recommended skilled nursing facility as encephalopathy resolved probably likely due to acute kidney injury.  Acute kidney injury superimposed on chronic kidney disease stage IIIa: Multifactorial in the setting of ACE inhibitor and postobstructive uropathy. Foley was placed in the ED and 2 L of urine came out. Nephrology was consulted recommended he was not a candidate for dialysis. ACE inhibitor was held he was started on IV fluid hydration his creatinine returned to baseline he will go home with a Foley.  Hyperkalemia: Resolved with Kayexalate.  SIRS/lactic acidosis: Likely due to acute kidney injury no source of infection. He completed a 3-day course of IV Vanco and cefepime culture data remain negative till date.  Possible BPH: Foley was placed 2 L of urine came out. He was started on Flomax we will follow-up with urology in 4 weeks and give a voiding trial.  Positive FOBT: There were no signs of overt bleeding he will have to follow-up with PCP as an outpatient.  Essential hypertension: No changes made to his medication he can resume his ACE inhibitor and other medication as an outpatient.  Elevated LFTs: Denied any abdominal pain this resolved.  Thrombocytosis: Likely reactive.  Discharge Instructions  Discharge Instructions     Diet - low sodium heart healthy   Complete by: As directed    Increase activity slowly   Complete by: As directed       Allergies as of  09/20/2022       Reactions   Atorvastatin Itching, Rash        Medication List     TAKE these medications    acetaminophen 325 MG tablet Commonly known as: TYLENOL Take 2 tablets (650 mg  total) by mouth every 6 (six) hours as needed for mild pain or headache (fever >/= 101).   albuterol 108 (90 Base) MCG/ACT inhaler Commonly known as: VENTOLIN HFA Inhale 2 puffs into the lungs every 4 (four) hours as needed for wheezing or shortness of breath.   amLODipine 5 MG tablet Commonly known as: NORVASC Take 5 mg by mouth daily.   aspirin EC 81 MG tablet Take 1 tablet (81 mg total) by mouth daily.   benazepril 10 MG tablet Commonly known as: LOTENSIN Take 10 mg by mouth daily.   bisacodyl 10 MG suppository Commonly known as: DULCOLAX Place 10 mg rectally daily as needed for moderate constipation. If not relieved by Milk of Magnesia, give 10mg  bisacodyl suppository rectally x 1 dose in 24 hours as needed.   cloNIDine 0.1 mg/24hr patch Commonly known as: CATAPRES - Dosed in mg/24 hr Place 0.1 mg onto the skin once a week.   divalproex 125 MG DR tablet Commonly known as: DEPAKOTE Take 125 mg by mouth daily.   magnesium hydroxide 400 MG/5ML suspension Commonly known as: MILK OF MAGNESIA Take 30 mLs by mouth daily as needed for mild constipation. If no BM in 3 days, give 30 cc Milk of Magnesia by month, 1 dose in 24 hours as needed.   metoprolol tartrate 25 MG tablet Commonly known as: LOPRESSOR Take 25 mg by mouth 2 (two) times daily.   NON FORMULARY DIET: REGULAR, THIN LIQUIDS   ondansetron 4 MG tablet Commonly known as: ZOFRAN Take 1 tablet (4 mg total) by mouth every 6 (six) hours as needed for nausea.   polyethylene glycol 17 g packet Commonly known as: MIRALAX / GLYCOLAX Take 17 g by mouth daily as needed for mild constipation.   QUEtiapine 25 MG tablet Commonly known as: SEROQUEL Take 12.5 mg by mouth daily.   RA SALINE ENEMA RE Place 1 Dose rectally daily as needed (constipation). If not relieved by biscodyl suppository, give disposable saline enema rectally x 1 dose/24 hours as needed.   tamsulosin 0.4 MG Caps capsule Commonly known as: FLOMAX Take  1 capsule (0.4 mg total) by mouth daily. Start taking on: September 21, 2022   UNABLE TO FIND Med Name: Magic Cup once daily to help stabilize weight        Allergies  Allergen Reactions   Atorvastatin Itching and Rash    Consultations: Nephrology   Procedures/Studies: CT ABDOMEN PELVIS WO CONTRAST  Result Date: 09/16/2022 CLINICAL DATA:  Abdominal pain, acute, nonlocalized shortness of breath, abdominal pain EXAM: CT ABDOMEN AND PELVIS WITHOUT CONTRAST TECHNIQUE: Multidetector CT imaging of the abdomen and pelvis was performed following the standard protocol without IV contrast. RADIATION DOSE REDUCTION: This exam was performed according to the departmental dose-optimization program which includes automated exposure control, adjustment of the mA and/or kV according to patient size and/or use of iterative reconstruction technique. COMPARISON:  None Available. FINDINGS: Lower chest: Coronary artery and aortic calcifications. No acute abnormality Hepatobiliary: No focal hepatic abnormality. Gallbladder unremarkable. Pancreas: No focal abnormality or ductal dilatation. Spleen: No focal abnormality.  Normal size. Adrenals/Urinary Tract: Bilateral nonobstructing renal stones. Bilateral hydronephrosis. No ureteral stones seen. Urinary bladder is distended above the level of the umbilicus. Foley catheter  in place. Adrenal glands unremarkable. Stomach/Bowel: Normal appendix Stomach, large and small bowel grossly unremarkable. Vascular/Lymphatic: Aortic atherosclerosis. No evidence of aneurysm or adenopathy. Reproductive: Prostate enlargement Other: No free fluid or free air. Musculoskeletal: No acute bony abnormality. IMPRESSION: Bilateral nephrolithiasis. Bilateral hydronephrosis without ureteral stones. Urinary bladder is distended above the level of the umbilicus. Foley catheter in place. Prostate enlargement. Aortic atherosclerosis.  Coronary artery disease. Electronically Signed   By: Rolm Baptise  M.D.   On: 09/16/2022 03:50   CT HEAD WO CONTRAST (5MM)  Result Date: 09/16/2022 CLINICAL DATA:  Delirium EXAM: CT HEAD WITHOUT CONTRAST TECHNIQUE: Contiguous axial images were obtained from the base of the skull through the vertex without intravenous contrast. RADIATION DOSE REDUCTION: This exam was performed according to the departmental dose-optimization program which includes automated exposure control, adjustment of the mA and/or kV according to patient size and/or use of iterative reconstruction technique. COMPARISON:  02/27/2020 FINDINGS: Brain: There is atrophy and chronic small vessel disease changes. No acute intracranial abnormality. Specifically, no hemorrhage, hydrocephalus, mass lesion, acute infarction, or significant intracranial injury. Vascular: No hyperdense vessel or unexpected calcification. Skull: No acute calvarial abnormality. Sinuses/Orbits: No acute findings Other: None IMPRESSION: Atrophy, chronic microvascular disease. No acute intracranial abnormality. Electronically Signed   By: Rolm Baptise M.D.   On: 09/16/2022 03:48   DG Chest Port 1 View  Result Date: 09/15/2022 CLINICAL DATA:  Questionable sepsis. Evaluate for infiltrate. Left elbow pain. EXAM: PORTABLE CHEST 1 VIEW LEFT ELBOW SERIES, 4 VIEWS COMPARISON:  Portable chest 02/26/2020. No prior left elbow series for comparison. FINDINGS: Chest: There are multiple overlying monitor wires mostly on the left. There is mild cardiomegaly with CHF pattern. No pleural effusion is seen. The lungs are clear. The mediastinum is stable with aortic calcification and mild tortuosity. There are moderate degenerative changes of the shoulders, mild degenerative change from dextroscoliosis thoracic spine. Bilateral basal patchy infiltrates noted previously have resolved in the interval. Left elbow four views: There is a sizable lucent subcutaneous soft tissue mass in the dorsolateral distal upper arm at the level of the distal humerus. The mass  is estimated to measure at least 8.0 x 2.3 x 1.1 cm in craniocaudal, transverse and AP axis. 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 MRI is recommended without and with contrast to assess for complicating features. There is no evidence of fracture, dislocation or joint effusion. Spurring is seen of the proximal radioulnar joint and a trochleoulnar joint. There is a curvilinear ossific body along side the lateral aspect of the radial head which is probably a dystrophic calcification due to old trauma or could be a loose body in the joint space. Prominent enthesopathic spurring arises from the lateral humeral epicondyle, with trace medial epicondylar spurring. There is normal bone mineralization. IMPRESSION: 1. No acute radiographic chest findings.  Mild cardiomegaly. 2. Aortic atherosclerosis. 3. 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 MRI is recommended without and with contrast to assess for complicating features. 4. Degenerative left elbow changes without evidence of acute fracture or dislocation. 5. Dystrophic calcification versus loose body along side the lateral aspect of the radial head. 6. Lateral greater than medial epicondylar enthesopathy. Electronically Signed   By: Telford Nab M.D.   On: 09/15/2022 22:52   DG Elbow Complete Left  Result Date: 09/15/2022 CLINICAL DATA:  Questionable sepsis. Evaluate for infiltrate. Left elbow pain. EXAM: PORTABLE CHEST 1 VIEW LEFT ELBOW SERIES, 4 VIEWS COMPARISON:  Portable chest 02/26/2020. No prior left elbow series for comparison. FINDINGS: Chest: There are multiple overlying monitor wires mostly on the left. There is mild cardiomegaly with CHF pattern. No pleural effusion is seen. The lungs are clear. The mediastinum is stable with aortic calcification and mild  tortuosity. There are moderate degenerative changes of the shoulders, mild degenerative change from dextroscoliosis thoracic spine. Bilateral basal patchy infiltrates noted previously have resolved in the interval. Left elbow four views: There is a sizable lucent subcutaneous soft tissue mass in the dorsolateral distal upper arm at the level of the distal humerus. The mass is estimated to measure at least 8.0 x 2.3 x 1.1 cm in craniocaudal, transverse and AP axis. 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 MRI is recommended without and with contrast to assess for complicating features. There is no evidence of fracture, dislocation or joint effusion. Spurring is seen of the proximal radioulnar joint and a trochleoulnar joint. There is a curvilinear ossific body along side the lateral aspect of the radial head which is probably a dystrophic calcification due to old trauma or could be a loose body in the joint space. Prominent enthesopathic spurring arises from the lateral humeral epicondyle, with trace medial epicondylar spurring. There is normal bone mineralization. IMPRESSION: 1. No acute radiographic chest findings.  Mild cardiomegaly. 2. Aortic atherosclerosis. 3. 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 MRI is recommended without and with contrast to assess for complicating features. 4. Degenerative left elbow changes without evidence of acute fracture or dislocation. 5. Dystrophic calcification versus loose body along side the lateral aspect of the radial head. 6. Lateral greater than medial epicondylar enthesopathy. Electronically Signed   By: Almira Bar M.D.   On: 09/15/2022 22:52   DG Pelvis Portable  Result Date: 09/15/2022 CLINICAL DATA:  Questionable sepsis - evaluate for abnormality EXAM: PORTABLE PELVIS 1-2  VIEWS COMPARISON:  None Available. FINDINGS: Limited evaluation due to overlapping osseous structures and overlying soft tissues. Question cortical irregularity of the L5-S1 level. Degenerative changes visualized lower lumbar spine. No acute displaced fracture dislocation of the hips on frontal view. No aggressive appearing pelvic bone lesions are seen. IMPRESSION: Limited evaluation due to overlapping osseous structures and overlying soft tissues. Question cortical irregularity of the L5-S1 level. Consider CT pelvis for further evaluation. Electronically Signed   By: Tish Frederickson M.D.   On: 09/15/2022 22:41   (Echo, Carotid, EGD, Colonoscopy, ERCP)    Subjective: No complaints  Discharge Exam: Vitals:   09/19/22 1933 09/20/22 0535  BP: 116/72 123/72  Pulse: 73 76  Resp: 10 19  Temp: 98.4 F (36.9 C) 97.9 F (36.6 C)  SpO2: 99% 100%   Vitals:   09/19/22 0200 09/19/22 0800 09/19/22 1933 09/20/22 0535  BP:  123/84 116/72 123/72  Pulse: 83 78 73 76  Resp: Temp:  98.5 F (36.9 C) 98.4 F (36.9 C) 97.9 F (36.6 C)  TempSrc:  Oral Oral   SpO2: 99% 100% 99% 100%  Weight:      Height:        General: Pt is alert, awake, not in acute distress Cardiovascular: RRR, S1/S2 +, no rubs, no gallops Respiratory: CTA bilaterally, no  wheezing, no rhonchi Abdominal: Soft, NT, ND, bowel sounds + Extremities: no edema, no cyanosis    The results of significant diagnostics from this hospitalization (including imaging, microbiology, ancillary and laboratory) are listed below for reference.     Microbiology: Recent Results (from the past 240 hour(s))  Urine Culture     Status: None   Collection Time: 09/15/22 10:22 PM   Specimen: In/Out Cath Urine  Result Value Ref Range Status   Specimen Description IN/OUT CATH URINE  Final   Special Requests NONE  Final   Culture   Final    NO GROWTH Performed at [facility], [address], [place], [place] [address]    Report  Status 09/16/2022 FINAL  Final  Blood Culture (routine x 2)     Status: None (Preliminary result)   Collection Time: 09/15/22 11:10 PM   Specimen: BLOOD LEFT FOREARM  Result Value Ref Range Status   Specimen Description BLOOD LEFT FOREARM  Final   Special Requests   Final    BOTTLES DRAWN AEROBIC AND ANAEROBIC Blood Culture results may not be optimal due to an inadequate volume of blood received in culture bottles   Culture   Final    NO GROWTH 4 DAYS Performed at [facility], [address], [place], [place] [address]    Report Status PENDING  Incomplete  Blood Culture (routine x 2)     Status: None (Preliminary result)   Collection Time: 09/15/22 11:15 PM   Specimen: BLOOD RIGHT FOREARM  Result Value Ref Range Status   Specimen Description BLOOD RIGHT FOREARM  Final   Special Requests   Final    BOTTLES DRAWN AEROBIC AND ANAEROBIC Blood Culture results may not be optimal due to an inadequate volume of blood received in culture bottles   Culture   Final    NO GROWTH 4 DAYS Performed at [facility], [address] [place], [place] [address]    Report Status PENDING  Incomplete  Resp Panel by RT-PCR (Flu A&B, Covid) Anterior Nasal Swab     Status: None   Collection Time: 09/15/22 11:20 PM   Specimen: Anterior Nasal Swab  Result Value Ref Range Status   SARS Coronavirus 2 by RT PCR NEGATIVE NEGATIVE Final    Comment: (NOTE) SARS-CoV-2 target nucleic acids are NOT DETECTED.  The SARS-CoV-2 RNA is generally detectable in upper respiratory specimens during the acute phase of infection. The lowest concentration of SARS-CoV-2 viral copies this assay can detect is 138 copies/mL. A negative result does not preclude SARS-Cov-2 infection and should not be used as the sole basis for treatment or other patient management decisions. A negative result may occur with  improper specimen collection/handling, submission of specimen other than nasopharyngeal swab, presence of viral  mutation(s) within the areas targeted by this assay, and inadequate number of viral copies(<138 copies/mL). A negative result must be combined with clinical observations, patient history, and epidemiological information. The expected result is Negative.  Fact Sheet for Patients:  BloggerCourse.com  Fact Sheet for Healthcare Providers:  SeriousBroker.it  This test is no t yet approved or cleared by the [place] FDA and  has been authorized for detection and/or diagnosis of SARS-CoV-2 by FDA under an Emergency Use Authorization (EUA). This EUA will remain  in effect (meaning this test can be used) for the duration of the COVID-19 declaration under Section 564(b)(1) of the Act, 21 U.S.C.section 360bbb-3(b)(1), unless the authorization is terminated  or revoked  sooner.       Influenza A by PCR NEGATIVE NEGATIVE Final   Influenza B by PCR NEGATIVE NEGATIVE Final    Comment: (NOTE) The Xpert Xpress SARS-CoV-2/FLU/RSV plus assay is intended as an aid in the diagnosis of influenza from Nasopharyngeal swab specimens and should not be used as a sole basis for treatment. Nasal washings and aspirates are unacceptable for Xpert Xpress SARS-CoV-2/FLU/RSV testing.  Fact Sheet for Patients: BloggerCourse.com  Fact Sheet for Healthcare Providers: SeriousBroker.it  This test is not yet approved or cleared by the [place] FDA and has been authorized for detection and/or diagnosis of SARS-CoV-2 by FDA under an Emergency Use Authorization (EUA). This EUA will remain in effect (meaning this test can be used) for the duration of the COVID-19 declaration under Section 564(b)(1) of the Act, 21 U.S.C. section 360bbb-3(b)(1), unless the authorization is terminated or revoked.  Performed at [facility], [address], [place], [place] [address]      Labs: BNP (last 3 results) No  results for input(s): "BNP" in the last 8760 hours. Basic Metabolic Panel: Recent Labs  Lab 09/16/22 0700 09/16/22 0748 09/17/22 0951 09/18/22 0907 09/19/22 0342 09/20/22 0419  NA  --  144 146* 145 139 139  K  --  5.2* 5.3* 4.2 3.9 3.8  CL  --  110 117* 115* 112* 110  CO2  --  22 20* 19* 19* 20*  GLUCOSE  --  169* 98 97 89 80  BUN  --  120* 30* CREATININE  --  7.18* 1.70* 1.24 1.25* 1.10  CALCIUM  --  8.5* 8.8* 8.7* 8.4* 8.2*  MG 2.2  --   --   --   --   --   PHOS 5.1* 5.2* 2.3*  --   --   --    Liver Function Tests: Recent Labs  Lab 09/16/22 0035 09/16/22 0748 09/17/22 0951  AST 34  --   --   ALT 73*  --   --   ALKPHOS 88  --   --   BILITOT 0.7  --   --   PROT 6.7  --   --   ALBUMIN 2.3* 2.0* 2.1*   No results for input(s): "LIPASE", "AMYLASE" in the last 168 hours. Recent Labs  Lab 09/16/22 0240  AMMONIA 33   CBC: Recent Labs  Lab 09/15/22 2315 09/16/22 0700 09/16/22 1602 09/17/22 0951  WBC 16.9* 12.1*  --  11.4*  NEUTROABS 14.6* 10.2*  --   --   HGB 15.9 13.9 13.7 13.8  HCT 45.9 39.4 40.3 42.3  MCV 91.1 90.4  --  95.7  PLT 490* 404*  --  396   Cardiac Enzymes: No results for input(s): "CKTOTAL", "CKMB", "CKMBINDEX", "TROPONINI" in the last 168 hours. BNP: Invalid input(s): "POCBNP" CBG: Recent Labs  Lab 09/16/22 0224 09/16/22 0300 09/16/22 0359 09/16/22 0500 09/16/22 0829  GLUCAP 95 170* 104* 123* 101*   D-Dimer No results for input(s): "DDIMER" in the last 72 hours. Hgb A1c No results for input(s): "HGBA1C" in the last 72 hours. Lipid Profile No results for input(s): "CHOL", "HDL", "LDLCALC", "TRIG", "CHOLHDL", "LDLDIRECT" in the last 72 hours. Thyroid function studies No results for input(s): "TSH", "T4TOTAL", "T3FREE", "THYROIDAB" in the last 72 hours.  Invalid input(s): "FREET3" Anemia work up No results for input(s): "VITAMINB12", "FOLATE", "FERRITIN", "TIBC", "IRON", "RETICCTPCT" in the last 72 hours. Urinalysis     Component Value Date/Time   COLORURINE YELLOW  09/15/2022 2222   APPEARANCEUR CLEAR 09/15/2022 2222   LABSPEC 1.011 09/15/2022 2222   PHURINE 5.0 09/15/2022 2222   GLUCOSEU NEGATIVE 09/15/2022 2222   HGBUR MODERATE (A) 09/15/2022 2222   BILIRUBINUR NEGATIVE 09/15/2022 2222   KETONESUR NEGATIVE 09/15/2022 2222   PROTEINUR NEGATIVE 09/15/2022 2222   NITRITE NEGATIVE 09/15/2022 2222   LEUKOCYTESUR NEGATIVE 09/15/2022 2222   Sepsis Labs Recent Labs  Lab 09/15/22 2315 09/16/22 0700 09/17/22 0951  WBC 16.9* 12.1* 11.4*   Microbiology Recent Results (from the past 240 hour(s))  Urine Culture     Status: None   Collection Time: 09/15/22 10:22 PM   Specimen: In/Out Cath Urine  Result Value Ref Range Status   Specimen Description IN/OUT CATH URINE  Final   Special Requests NONE  Final   Culture   Final    NO GROWTH Performed at [facility], [address] [place], [place] [address]    Report Status 09/16/2022 FINAL  Final  Blood Culture (routine x 2)     Status: None (Preliminary result)   Collection Time: 09/15/22 11:10 PM   Specimen: BLOOD LEFT FOREARM  Result Value Ref Range Status   Specimen Description BLOOD LEFT FOREARM  Final   Special Requests   Final    BOTTLES DRAWN AEROBIC AND ANAEROBIC Blood Culture results may not be optimal due to an inadequate volume of blood received in culture bottles   Culture   Final    NO GROWTH 4 DAYS Performed at [facility], [address] [place], [place] [address]    Report Status PENDING  Incomplete  Blood Culture (routine x 2)     Status: None (Preliminary result)   Collection Time: 09/15/22 11:15 PM   Specimen: BLOOD RIGHT FOREARM  Result Value Ref Range Status   Specimen Description BLOOD RIGHT FOREARM  Final   Special Requests   Final    BOTTLES DRAWN AEROBIC AND ANAEROBIC Blood Culture results may not be optimal due to an inadequate volume of blood received in culture bottles   Culture   Final    NO GROWTH 4  DAYS Performed at [facility], [address] [place], [place] [address]    Report Status PENDING  Incomplete  Resp Panel by RT-PCR (Flu A&B, Covid) Anterior Nasal Swab     Status: None   Collection Time: 09/15/22 11:20 PM   Specimen: Anterior Nasal Swab  Result Value Ref Range Status   SARS Coronavirus 2 by RT PCR NEGATIVE NEGATIVE Final    Comment: (NOTE) SARS-CoV-2 target nucleic acids are NOT DETECTED.  The SARS-CoV-2 RNA is generally detectable in upper respiratory specimens during the acute phase of infection. The lowest concentration of SARS-CoV-2 viral copies this assay can detect is 138 copies/mL. A negative result does not preclude SARS-Cov-2 infection and should not be used as the sole basis for treatment or other patient management decisions. A negative result may occur with  improper specimen collection/handling, submission of specimen other than nasopharyngeal swab, presence of viral mutation(s) within the areas targeted by this assay, and inadequate number of viral copies(<138 copies/mL). A negative result must be combined with clinical observations, patient history, and epidemiological information. The expected result is Negative.  Fact Sheet for Patients:  BloggerCourse.com  Fact Sheet for Healthcare Providers:  SeriousBroker.it  This test is no t yet approved or cleared by the [place] FDA and  has been authorized for detection and/or diagnosis of SARS-CoV-2 by FDA under  an Emergency Use Authorization (EUA). This EUA will remain  in effect (meaning this test can be used) for the duration of the COVID-19 declaration under Section 564(b)(1) of the Act, 21 U.S.C.section 360bbb-3(b)(1), unless the authorization is terminated  or revoked sooner.       Influenza A by PCR NEGATIVE NEGATIVE Final   Influenza B by PCR NEGATIVE NEGATIVE Final    Comment: (NOTE) The Xpert Xpress SARS-CoV-2/FLU/RSV plus  assay is intended as an aid in the diagnosis of influenza from Nasopharyngeal swab specimens and should not be used as a sole basis for treatment. Nasal washings and aspirates are unacceptable for Xpert Xpress SARS-CoV-2/FLU/RSV testing.  Fact Sheet for Patients: BloggerCourse.com  Fact Sheet for Healthcare Providers: SeriousBroker.it  This test is not yet approved or cleared by the [place] FDA and has been authorized for detection and/or diagnosis of SARS-CoV-2 by FDA under an Emergency Use Authorization (EUA). This EUA will remain in effect (meaning this test can be used) for the duration of the COVID-19 declaration under Section 564(b)(1) of the Act, 21 U.S.C. section 360bbb-3(b)(1), unless the authorization is terminated or revoked.  Performed at [facility], [address] [place], [place] [address]      SIGNED:   Marinda Elk, MD  [facility] 09/20/2022, 9:27 AM Pager   If 7PM-7AM, please contact night-coverage [email or web address] Password [email or web address]

## 2022-09-20 NOTE — Progress Notes (Signed)
Report called to [facility] at [facility].  Foley catheter in place per order.  Accepting RN made aware.

## 2022-09-21 LAB — CULTURE, BLOOD (ROUTINE X 2)
Culture: NO GROWTH
Culture: NO GROWTH

## 2022-11-15 DEATH — deceased
